# Patient Record
Sex: Female | Born: 1960 | Race: White | Hispanic: No | Smoking: Never smoker
Health system: Southern US, Community
[De-identification: ages and names within clinical notes are randomized; demographics above are authoritative.]

## PROBLEM LIST (undated history)

## (undated) ENCOUNTER — Ambulatory Visit (HOSPITAL_BASED_OUTPATIENT_CLINIC_OR_DEPARTMENT_OTHER): Admission: EM

## (undated) DIAGNOSIS — E119 Type 2 diabetes mellitus without complications: Secondary | ICD-10-CM

## (undated) DIAGNOSIS — T7840XA Allergy, unspecified, initial encounter: Secondary | ICD-10-CM

## (undated) DIAGNOSIS — Z8489 Family history of other specified conditions: Secondary | ICD-10-CM

## (undated) DIAGNOSIS — E039 Hypothyroidism, unspecified: Secondary | ICD-10-CM

## (undated) DIAGNOSIS — J45909 Unspecified asthma, uncomplicated: Secondary | ICD-10-CM

## (undated) DIAGNOSIS — R0600 Dyspnea, unspecified: Secondary | ICD-10-CM

## (undated) DIAGNOSIS — M1990 Unspecified osteoarthritis, unspecified site: Secondary | ICD-10-CM

## (undated) DIAGNOSIS — I10 Essential (primary) hypertension: Secondary | ICD-10-CM

## (undated) DIAGNOSIS — K219 Gastro-esophageal reflux disease without esophagitis: Secondary | ICD-10-CM

## (undated) DIAGNOSIS — E079 Disorder of thyroid, unspecified: Secondary | ICD-10-CM

## (undated) DIAGNOSIS — G709 Myoneural disorder, unspecified: Secondary | ICD-10-CM

## (undated) HISTORY — DX: Unspecified osteoarthritis, unspecified site: M19.90

## (undated) HISTORY — DX: Disorder of thyroid, unspecified: E07.9

## (undated) HISTORY — PX: HYSTERECTOMY ABDOMINAL WITH SALPINGO-OOPHORECTOMY: SHX6792

## (undated) HISTORY — DX: Myoneural disorder, unspecified: G70.9

## (undated) HISTORY — DX: Unspecified asthma, uncomplicated: J45.909

## (undated) HISTORY — DX: Type 2 diabetes mellitus without complications: E11.9

## (undated) HISTORY — PX: CHOLECYSTECTOMY: SHX55

## (undated) HISTORY — PX: POLYPECTOMY: SHX149

## (undated) HISTORY — DX: Essential (primary) hypertension: I10

## (undated) HISTORY — PX: TUBAL LIGATION: SHX77

## (undated) HISTORY — PX: PORTA CATH INSERTION: CATH118285

## (undated) HISTORY — PX: ABDOMINAL HYSTERECTOMY: SHX81

## (undated) HISTORY — DX: Allergy, unspecified, initial encounter: T78.40XA

## (undated) MED FILL — Dextrose Inj 5%: INTRAVENOUS | Qty: 250 | Status: AC

## (undated) MED FILL — Dexamethasone Sodium Phosphate Inj 100 MG/10ML: INTRAMUSCULAR | Qty: 1 | Status: AC

## (undated) MED FILL — Leucovorin Calcium For Inj 350 MG: INTRAMUSCULAR | Qty: 36.6 | Status: AC

## (undated) MED FILL — Fluorouracil IV Soln 5 GM/100ML (50 MG/ML): INTRAVENOUS | Qty: 88 | Status: AC

## (undated) MED FILL — Oxaliplatin IV Soln 100 MG/20ML: INTRAVENOUS | Qty: 30 | Status: AC

## (undated) MED FILL — Fluorouracil IV Soln 2.5 GM/50ML (50 MG/ML): INTRAVENOUS | Qty: 15 | Status: AC

---

## 1999-12-09 ENCOUNTER — Emergency Department (HOSPITAL_COMMUNITY): Admission: EM | Admit: 1999-12-09 | Discharge: 1999-12-09 | Payer: Self-pay | Admitting: Emergency Medicine

## 1999-12-09 ENCOUNTER — Encounter: Payer: Self-pay | Admitting: Emergency Medicine

## 1999-12-16 ENCOUNTER — Emergency Department (HOSPITAL_COMMUNITY): Admission: EM | Admit: 1999-12-16 | Discharge: 1999-12-16 | Payer: Self-pay | Admitting: Emergency Medicine

## 2002-12-05 ENCOUNTER — Emergency Department (HOSPITAL_COMMUNITY): Admission: EM | Admit: 2002-12-05 | Discharge: 2002-12-05 | Payer: Self-pay | Admitting: Emergency Medicine

## 2004-04-10 ENCOUNTER — Ambulatory Visit: Payer: Self-pay | Admitting: Family Medicine

## 2004-10-09 ENCOUNTER — Ambulatory Visit: Payer: Self-pay | Admitting: Family Medicine

## 2004-10-10 ENCOUNTER — Ambulatory Visit: Payer: Self-pay | Admitting: *Deleted

## 2004-10-14 ENCOUNTER — Encounter: Admission: RE | Admit: 2004-10-14 | Discharge: 2004-10-14 | Payer: Self-pay | Admitting: Family Medicine

## 2005-03-20 ENCOUNTER — Encounter (INDEPENDENT_AMBULATORY_CARE_PROVIDER_SITE_OTHER): Payer: Self-pay | Admitting: Family Medicine

## 2005-03-20 ENCOUNTER — Ambulatory Visit: Payer: Self-pay | Admitting: Family Medicine

## 2005-03-20 LAB — CONVERTED CEMR LAB: Pap Smear: ABNORMAL

## 2005-07-31 ENCOUNTER — Ambulatory Visit: Payer: Self-pay | Admitting: Family Medicine

## 2005-08-12 ENCOUNTER — Ambulatory Visit: Payer: Self-pay | Admitting: Family Medicine

## 2005-08-19 ENCOUNTER — Ambulatory Visit: Payer: Self-pay | Admitting: Internal Medicine

## 2005-08-28 ENCOUNTER — Ambulatory Visit: Payer: Self-pay | Admitting: Internal Medicine

## 2005-09-29 ENCOUNTER — Ambulatory Visit: Payer: Self-pay | Admitting: Family Medicine

## 2005-10-23 ENCOUNTER — Ambulatory Visit: Payer: Self-pay | Admitting: Family Medicine

## 2005-10-23 ENCOUNTER — Encounter: Payer: Self-pay | Admitting: Family Medicine

## 2005-10-23 ENCOUNTER — Encounter (INDEPENDENT_AMBULATORY_CARE_PROVIDER_SITE_OTHER): Payer: Self-pay | Admitting: Family Medicine

## 2005-10-23 LAB — CONVERTED CEMR LAB: Pap Smear: NORMAL

## 2005-11-03 ENCOUNTER — Encounter: Admission: RE | Admit: 2005-11-03 | Discharge: 2005-11-03 | Payer: Self-pay | Admitting: Family Medicine

## 2005-11-03 DIAGNOSIS — N63 Unspecified lump in unspecified breast: Secondary | ICD-10-CM | POA: Insufficient documentation

## 2005-11-13 ENCOUNTER — Encounter: Admission: RE | Admit: 2005-11-13 | Discharge: 2005-11-13 | Payer: Self-pay | Admitting: Family Medicine

## 2005-11-19 ENCOUNTER — Ambulatory Visit: Payer: Self-pay | Admitting: Family Medicine

## 2005-12-17 ENCOUNTER — Ambulatory Visit: Payer: Self-pay | Admitting: Family Medicine

## 2005-12-19 ENCOUNTER — Ambulatory Visit (HOSPITAL_COMMUNITY): Admission: RE | Admit: 2005-12-19 | Discharge: 2005-12-19 | Payer: Self-pay | Admitting: Obstetrics and Gynecology

## 2006-01-28 ENCOUNTER — Ambulatory Visit: Payer: Self-pay | Admitting: Obstetrics & Gynecology

## 2006-04-08 ENCOUNTER — Ambulatory Visit: Payer: Self-pay | Admitting: Family Medicine

## 2006-04-20 ENCOUNTER — Ambulatory Visit: Payer: Self-pay | Admitting: Family Medicine

## 2006-04-22 ENCOUNTER — Ambulatory Visit: Payer: Self-pay | Admitting: Family Medicine

## 2006-04-23 ENCOUNTER — Emergency Department (HOSPITAL_COMMUNITY): Admission: EM | Admit: 2006-04-23 | Discharge: 2006-04-23 | Payer: Self-pay | Admitting: Emergency Medicine

## 2006-06-29 ENCOUNTER — Encounter: Admission: RE | Admit: 2006-06-29 | Discharge: 2006-06-29 | Payer: Self-pay | Admitting: Family Medicine

## 2006-10-06 ENCOUNTER — Ambulatory Visit: Payer: Self-pay | Admitting: Family Medicine

## 2006-10-06 LAB — CONVERTED CEMR LAB: Cholesterol: 166 mg/dL

## 2006-10-18 ENCOUNTER — Emergency Department (HOSPITAL_COMMUNITY): Admission: EM | Admit: 2006-10-18 | Discharge: 2006-10-18 | Payer: Self-pay | Admitting: Emergency Medicine

## 2006-12-29 ENCOUNTER — Encounter: Payer: Self-pay | Admitting: Family Medicine

## 2006-12-29 DIAGNOSIS — J45909 Unspecified asthma, uncomplicated: Secondary | ICD-10-CM | POA: Insufficient documentation

## 2006-12-29 DIAGNOSIS — N809 Endometriosis, unspecified: Secondary | ICD-10-CM | POA: Insufficient documentation

## 2006-12-29 DIAGNOSIS — J40 Bronchitis, not specified as acute or chronic: Secondary | ICD-10-CM | POA: Insufficient documentation

## 2006-12-29 DIAGNOSIS — E669 Obesity, unspecified: Secondary | ICD-10-CM | POA: Insufficient documentation

## 2006-12-29 DIAGNOSIS — F3289 Other specified depressive episodes: Secondary | ICD-10-CM | POA: Insufficient documentation

## 2006-12-29 DIAGNOSIS — I10 Essential (primary) hypertension: Secondary | ICD-10-CM | POA: Insufficient documentation

## 2006-12-29 DIAGNOSIS — E039 Hypothyroidism, unspecified: Secondary | ICD-10-CM | POA: Insufficient documentation

## 2006-12-29 DIAGNOSIS — J301 Allergic rhinitis due to pollen: Secondary | ICD-10-CM | POA: Insufficient documentation

## 2006-12-29 DIAGNOSIS — F329 Major depressive disorder, single episode, unspecified: Secondary | ICD-10-CM

## 2007-01-27 ENCOUNTER — Encounter (INDEPENDENT_AMBULATORY_CARE_PROVIDER_SITE_OTHER): Payer: Self-pay | Admitting: *Deleted

## 2007-02-04 ENCOUNTER — Telehealth (INDEPENDENT_AMBULATORY_CARE_PROVIDER_SITE_OTHER): Payer: Self-pay | Admitting: *Deleted

## 2007-03-03 ENCOUNTER — Emergency Department (HOSPITAL_COMMUNITY): Admission: EM | Admit: 2007-03-03 | Discharge: 2007-03-03 | Payer: Self-pay | Admitting: Gynecologic Oncology

## 2007-03-03 ENCOUNTER — Encounter (INDEPENDENT_AMBULATORY_CARE_PROVIDER_SITE_OTHER): Payer: Self-pay | Admitting: Family Medicine

## 2007-03-03 DIAGNOSIS — N10 Acute tubulo-interstitial nephritis: Secondary | ICD-10-CM | POA: Insufficient documentation

## 2007-03-09 ENCOUNTER — Encounter (INDEPENDENT_AMBULATORY_CARE_PROVIDER_SITE_OTHER): Payer: Self-pay | Admitting: Family Medicine

## 2007-03-09 ENCOUNTER — Ambulatory Visit: Payer: Self-pay | Admitting: Family Medicine

## 2007-03-09 LAB — CONVERTED CEMR LAB
Blood in Urine, dipstick: NEGATIVE
Hgb A1c MFr Bld: 5.8 %
Nitrite: NEGATIVE

## 2007-03-15 ENCOUNTER — Encounter: Admission: RE | Admit: 2007-03-15 | Discharge: 2007-03-15 | Payer: Self-pay | Admitting: Family Medicine

## 2007-03-24 ENCOUNTER — Encounter (INDEPENDENT_AMBULATORY_CARE_PROVIDER_SITE_OTHER): Payer: Self-pay | Admitting: Family Medicine

## 2007-03-24 LAB — CONVERTED CEMR LAB
Blood in Urine, dipstick: NEGATIVE
Glucose, Urine, Semiquant: NEGATIVE
Ketones, urine, test strip: NEGATIVE
Nitrite: NEGATIVE
Protein, U semiquant: NEGATIVE
Specific Gravity, Urine: 1.025
WBC Urine, dipstick: NEGATIVE
pH: 6

## 2007-05-11 ENCOUNTER — Telehealth (INDEPENDENT_AMBULATORY_CARE_PROVIDER_SITE_OTHER): Payer: Self-pay | Admitting: Family Medicine

## 2007-07-05 ENCOUNTER — Ambulatory Visit: Payer: Self-pay | Admitting: Family Medicine

## 2007-07-05 DIAGNOSIS — R3589 Other polyuria: Secondary | ICD-10-CM | POA: Insufficient documentation

## 2007-07-05 DIAGNOSIS — B3731 Acute candidiasis of vulva and vagina: Secondary | ICD-10-CM | POA: Insufficient documentation

## 2007-07-05 DIAGNOSIS — R358 Other polyuria: Secondary | ICD-10-CM

## 2007-07-05 DIAGNOSIS — B373 Candidiasis of vulva and vagina: Secondary | ICD-10-CM

## 2007-07-05 DIAGNOSIS — J069 Acute upper respiratory infection, unspecified: Secondary | ICD-10-CM | POA: Insufficient documentation

## 2007-07-05 LAB — CONVERTED CEMR LAB
Protein, U semiquant: NEGATIVE
Specific Gravity, Urine: <1.005
pH: 5.5

## 2007-07-06 ENCOUNTER — Encounter (INDEPENDENT_AMBULATORY_CARE_PROVIDER_SITE_OTHER): Payer: Self-pay | Admitting: Family Medicine

## 2007-07-13 ENCOUNTER — Encounter (INDEPENDENT_AMBULATORY_CARE_PROVIDER_SITE_OTHER): Payer: Self-pay | Admitting: *Deleted

## 2007-10-12 ENCOUNTER — Ambulatory Visit: Payer: Self-pay | Admitting: Family Medicine

## 2007-10-12 DIAGNOSIS — A088 Other specified intestinal infections: Secondary | ICD-10-CM | POA: Insufficient documentation

## 2007-12-10 ENCOUNTER — Telehealth (INDEPENDENT_AMBULATORY_CARE_PROVIDER_SITE_OTHER): Payer: Self-pay | Admitting: *Deleted

## 2008-01-26 ENCOUNTER — Telehealth (INDEPENDENT_AMBULATORY_CARE_PROVIDER_SITE_OTHER): Payer: Self-pay | Admitting: *Deleted

## 2008-01-28 ENCOUNTER — Ambulatory Visit: Payer: Self-pay | Admitting: Family Medicine

## 2008-01-28 ENCOUNTER — Encounter (INDEPENDENT_AMBULATORY_CARE_PROVIDER_SITE_OTHER): Payer: Self-pay | Admitting: Internal Medicine

## 2008-01-28 DIAGNOSIS — N949 Unspecified condition associated with female genital organs and menstrual cycle: Secondary | ICD-10-CM | POA: Insufficient documentation

## 2008-01-28 DIAGNOSIS — N938 Other specified abnormal uterine and vaginal bleeding: Secondary | ICD-10-CM | POA: Insufficient documentation

## 2008-01-28 DIAGNOSIS — N390 Urinary tract infection, site not specified: Secondary | ICD-10-CM | POA: Insufficient documentation

## 2008-01-28 LAB — CONVERTED CEMR LAB
KOH Prep: NEGATIVE
Nitrite: POSITIVE
Urobilinogen, UA: 1

## 2008-01-29 ENCOUNTER — Encounter (INDEPENDENT_AMBULATORY_CARE_PROVIDER_SITE_OTHER): Payer: Self-pay | Admitting: Internal Medicine

## 2008-02-25 ENCOUNTER — Encounter (INDEPENDENT_AMBULATORY_CARE_PROVIDER_SITE_OTHER): Payer: Self-pay | Admitting: Internal Medicine

## 2010-02-26 ENCOUNTER — Encounter (INDEPENDENT_AMBULATORY_CARE_PROVIDER_SITE_OTHER): Payer: Self-pay | Admitting: Internal Medicine

## 2010-06-11 NOTE — Letter (Signed)
Summary: FAXED REQUESTED RECORDS TO [facility]  FAXED REQUESTED RECORDS TO [facility]   Imported By: Arta Bruce 02/26/2010 09:56:27  _____________________________________________________________________  External Attachment:    Type:   Image     Comment:   External Document

## 2010-09-27 NOTE — Group Therapy Note (Signed)
NAMESHANTINIQUE, PICAZO NO.:  [id number]   MEDICAL RECORD NO.:  [id number]          PATIENT TYPE:  WOC   LOCATION:  [facility]                   FACILITY:  [facility]   PHYSICIAN:  Elsie Lincoln, MD      DATE OF BIRTH:  October 12, 1960   DATE OF SERVICE:                                    CLINIC NOTE   The patient is a 50 year old female who presents to follow up on  endometriosis.  The patient was placed on Ortho Tri-Cyclen which has been  changed to Alesse as this is available at [facility].  She also has to  take the Anaprox DS b.i.d. or p.r.n. for pain.  This has improved her pain  to a bearable level.  The fact is it is still painful and we did talk about  this some.  Her husband does not seem to be very understanding about sex  being painful for her and only has sex on all fours as this is easier on  him, however this is very painful for her as this allows for deep  penetration.  We talked about communication and how this will improve her  sexual life and her relationship with her husband.  The patient is to come  back in six months to follow up on endometriosis.  Her Pap smear is up to  date.  She reports her mammogram is up to date.  She gets these done at  [facility].  The patient is to come back again in six months.           ______________________________  Elsie Lincoln, MD     KL/MEDQ  D:  01/28/2006  T:  01/29/2006  Job:  [id number]

## 2010-09-27 NOTE — Consult Note (Signed)
NAMECORRIE, REDER NO.:  [id number]   MEDICAL RECORD NO.:  [id number]          PATIENT TYPE:  WOC   LOCATION:  [facility]                          FACILITY:  [facility]   PHYSICIAN:  Madelynn Done, MD  DATE OF BIRTH:  Jun 03, 1960   DATE OF CONSULTATION:  DATE OF DISCHARGE:  01/28/2006                                 CONSULTATION   REQUESTING PHYSICIAN:  Nelva Nay, MD emergency department.   REASON FOR CONSULTATION:  Right index finger abscess.   BRIEF HISTORY:  Tanya Mcclure is a 50 year old right-hand-dominant female  who approximately six days ago noted pain and swelling in her right  index finger.  She was seen by a physician twice who treated her with  oral antibiotics.  She was seen yesterday and had an attempted drainage  of the area of concern over the index finger, but no fluid was removed.  She presented today to the emergency department with continued pain and  increased swelling of her right index finger.  She denies any fevers,  chills, or any constitutional signs of infection.  She does not recall  any specific injury to the finger.  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   PAST MEDICAL HISTORY:  Hypertension.  She denies high blood sugars,  heart disease, lung disease, or kidney disease.   PAST SURGICAL HISTORY:  None.   ALLERGIES:  PENICILLIN.   MEDICATIONS:  None currently.   SOCIAL HISTORY:  She is a nonsmoker.  She is married.  She has a 51 year  old and a 51 year old who lives at home, and they live in [place].  She also has another child with two grand kids that live in [place].  Again, she works at [employer].   REVIEW OF SYSTEMS:  As mentioned above.  No recent illnesses,  hospitalizations.   PHYSICAL EXAMINATION:  GENERAL:  She is a healthy-appearing white female  in no acute distress.  VITAL SIGNS:  She is afebrile.  Her  vital signs are stable and normal.  RIGHT UPPER EXTREMITY:  On examination of her right upper extremity, she  has no areas of lymphangitis or proximal redness.  She does have pain  and swelling of her right index finger.  There are no other focal areas  of swelling.  There is an isolated area of swelling and redness over the  proximal phalanx of the index finger on the volar surface.  She does not  have tenderness to palpation along the entire length of the flexor  tendon sheath.  She is not swollen distal to the proximal phalangeal  region.  There is fluctuant area over the volar radial side of the  proximal phalanx with some skin changes present.  There is no draining  wound.  She is able to flex the DIP and the PIP joint of the finger.  She has some pain with slight passive motion of the finger.  It is not a  sausage-appearing digit.  There does not appear to be evidence of a  flexor tendon sheath infection, tenosynovitis.  The finger is well  perfused.  Sensation to light touch is present to the tip of her finger.  On the remainder of the hands, she has no other evidence of any open  wounds.  She does have a small cutaneous wart on the dorsum of the right  middle finger.  The remainder of the hand is well perfused with good  capillary refill.  She has normal muscle tone to the hand.   LABORATORY STUDIES:  She has a normal white count.   RADIOGRAPHS:  AP, lateral, and oblique films of the right index finger  do not show any osseous involvement.  There is soft tissue swelling  noted over the P1 region of the finger.   IMPRESSION:  Right index finger subcutaneous abscess.   PLAN:  I talked to the patient about incision and drainage in the  emergency department.  I felt it was necessary that the patient undergo  the procedure to drain the focal collection of purulence.  The patient  agreed to proceed with this.  A digital block was performed of 10 mL of  1% lidocaine without  epinephrine.  She tolerated this procedure well.   PROCEDURE NOTE:  The right index finger and the hand was then prepped  with Betadine.  It was then sterilely draped.  Under sterile conditions,  using a 15-blade scalpel, where the previous attempts at incising the  abscess had been made, this was extended both proximally and distally  longitudinally.  Dissection was carried down through the skin and  through the subcutaneous tissue, and gross purulence was then  encountered.  Cultures were then taken.  Using a blunt hemostat, blunt  dissection was then done to the ulnar side of the finger in order to  open up the entire purulent cavity.  The purulent cavity was then  expressed and thoroughly irrigated with saline solution.  The abscess  was then packed with sterile packing gauze.  The patient tolerated the  procedure well.  A sterile dressing was then applied, and the finger was  then wrapped in Coban.   RECOMMENDATIONS:  The patient is going to follow up in my office  tomorrow in the afternoon to look at the wound and to likely repack the  wound.  She is going to continue with her antibiotics.  She had been on  what sounds like Bactrim, and oral pain medication will be given.  The  patient was instructed to return to the clinic or the emergency  department sooner if she has worsening pain or any systemic signs of  infection.  The patient's questions were answered today.      Madelynn Done, MD  Electronically Signed     FWO/MEDQ  D:  04/23/2006  T:  04/24/2006  Job:  [id number]
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
## 2011-02-19 LAB — BASIC METABOLIC PANEL
BUN: 13
CO2: 26
Calcium: 8.7
Creatinine, Ser: 1.03
GFR calc Af Amer: >60
GFR calc non Af Amer: 57 — ABNORMAL LOW
Glucose, Bld: 135 — ABNORMAL HIGH

## 2011-02-19 LAB — URINALYSIS, ROUTINE W REFLEX MICROSCOPIC
Glucose, UA: NEGATIVE
Protein, ur: 100 — AB

## 2011-02-19 LAB — DIFFERENTIAL
Basophils Relative: 1
Eosinophils Absolute: 0
Eosinophils Relative: 0
Lymphocytes Relative: 9 — ABNORMAL LOW
Neutro Abs: 6
Neutrophils Relative %: 77

## 2011-02-19 LAB — CBC
MCV: 84.3
Platelets: 204
RDW: 12.4
WBC: 7.7

## 2011-02-19 LAB — URINE MICROSCOPIC-ADD ON

## 2015-12-19 ENCOUNTER — Encounter: Payer: Self-pay | Admitting: Sports Medicine

## 2015-12-19 ENCOUNTER — Encounter (INDEPENDENT_AMBULATORY_CARE_PROVIDER_SITE_OTHER): Payer: Self-pay

## 2015-12-19 ENCOUNTER — Ambulatory Visit (INDEPENDENT_AMBULATORY_CARE_PROVIDER_SITE_OTHER): Payer: Medicaid Other | Admitting: Sports Medicine

## 2015-12-19 ENCOUNTER — Ambulatory Visit (INDEPENDENT_AMBULATORY_CARE_PROVIDER_SITE_OTHER): Payer: Medicaid Other

## 2015-12-19 DIAGNOSIS — M79673 Pain in unspecified foot: Secondary | ICD-10-CM

## 2015-12-19 DIAGNOSIS — M722 Plantar fascial fibromatosis: Secondary | ICD-10-CM

## 2015-12-19 DIAGNOSIS — E119 Type 2 diabetes mellitus without complications: Secondary | ICD-10-CM

## 2015-12-19 MED ORDER — TRIAMCINOLONE ACETONIDE 10 MG/ML IJ SUSP: 10.0000 mg | Freq: Once | INTRAMUSCULAR | Status: AC

## 2015-12-19 MED ORDER — MELOXICAM 15 MG PO TABS: 15.0000 mg | ORAL_TABLET | Freq: Every day | ORAL | 0 refills | Status: DC

## 2015-12-19 MED ORDER — METHYLPREDNISOLONE 4 MG PO TBPK: ORAL_TABLET | ORAL | 0 refills | Status: DC

## 2015-12-19 NOTE — Patient Instructions (Signed)

## 2015-12-19 NOTE — Progress Notes (Signed)
Subjective: Tanya Mcclure is a 55 y.o. diabetic female patient presents to office with complaint of heel pain on the Right>left. Patient admits to post static dyskinesia for 6 months on right, and a few weeks on left. Patient has treated this problem with going to primary care doctor and was given topical lidocaine pain cream with no relief. Denies any other pedal complaints.   Fasting blood sugar 99 this morning  Patient Active Problem List   Diagnosis Date Noted  . UTI 01/28/2008  . MENORRHAGIA, PERIMENOPAUSAL 01/28/2008  . GASTROENTERITIS, VIRAL 10/12/2007  . VAGINITIS, CANDIDAL 07/05/2007  . URI 07/05/2007  . POLYURIA 07/05/2007  . PYELONEPHRITIS, ACUTE 03/03/2007  . HYPOTHYROIDISM 12/29/2006  . OBESITY NOS 12/29/2006  . DEPRESSION 12/29/2006  . HYPERTENSION 12/29/2006  . ALLERGIC RHINITIS, SEASONAL 12/29/2006  . BRONCHITIS 12/29/2006  . ASTHMA 12/29/2006  . ENDOMETRIOSIS 12/29/2006  . BREAST MASS, LEFT 11/03/2005    No current outpatient prescriptions on file prior to visit.   No current facility-administered medications on file prior to visit.     Allergies  Allergen Reactions  . Penicillins     Objective: Physical Exam General: The patient is alert and oriented x3 in no acute distress.  Dermatology: Skin is warm, dry and supple bilateral lower extremities. Nails 1-10 are normal. There is no erythema, edema, no eccymosis, no open lesions present. Integument is otherwise unremarkable.  Vascular: Dorsalis Pedis pulse and Posterior Tibial pulse are 2/4 bilateral. Capillary fill time is immediate to all digits.  Neurological: Grossly intact to light touch with an achilles reflex of +2/5 and a negative Tinel's sign bilateral.Vibratory and protective with Semmes Weinstein monofilament intact bilateral.  Musculoskeletal: Tenderness to palpation at the medial calcaneal tubercale and through the insertion of the plantar fascia on the right greater than left foot. No pain  with compression of calcaneus bilateral. No pain with tuning fork to calcaneus bilateral. No pain with calf compression bilateral. There is decreased Ankle joint range of motion bilateral. All other joints range of motion within normal limits bilateral. Strength 5/5 in all groups bilateral. Pes planus foot type with mild lesser hammertoes.  Xray, Right and Left foot:  Normal osseous mineralization. Joint spaces preserved, except at first metatarsal phalangeal joint where there is mild narrowing bilateral. Planus foot type. Mild lesser hammertoe, mild enlargement of the third metatarsal head left, No fracture/dislocation/boney destruction. Calcaneal spur present with mild thickening of plantar fascia, right greater than left. No other soft tissue abnormalities or radiopaque foreign bodies.   Assessment and Plan: Problem List Items Addressed This Visit    None    Visit Diagnoses    Foot pain, unspecified laterality    -  Primary   Relevant Medications   methylPREDNISolone (MEDROL DOSEPAK) 4 MG TBPK tablet   meloxicam (MOBIC) 15 MG tablet   triamcinolone acetonide (KENALOG) 10 MG/ML injection 10 mg   Other Relevant Orders   DG Foot 2 Views Left   DG Foot 2 Views Right   Plantar fasciitis       Relevant Medications   methylPREDNISolone (MEDROL DOSEPAK) 4 MG TBPK tablet   meloxicam (MOBIC) 15 MG tablet   triamcinolone acetonide (KENALOG) 10 MG/ML injection 10 mg   Diabetes mellitus without complication (HCC)          -Complete examination performed.  -Xrays reviewed -Discussed with patient in detail the condition of plantar fasciitis, how this occurs and general treatment options. Explained both conservative and surgical treatments.  -After oral consent and aseptic  prep, injected a mixture containing 1 ml of 2% plain lidocaine, 1 ml 0.5% plain marcaine, 0.5 ml of kenalog 10 and 0.5 ml of dexamethasone phosphate into Right and left heels. Post-injection care discussed with patient.  -Rx  Meloxicam to start after Medrol dose pack is completed -Recommended good supportive shoes and advised use of OTC insert. Explained to patient that if these orthoses work well, we will continue with these. If these do not improve her condition and  pain, we will consider custom molded orthoses with diabetic shoes with prescription from [place] and dispensed to patient daily stretching exercises. -Recommend patient to ice affected area 1-2x daily. -Recommend daily foot inspection in setting of diabetes. -Patient to return to office in 4 weeks for follow up or sooner if problems or questions arise.  Landis Martins, DPM

## 2016-01-16 ENCOUNTER — Ambulatory Visit (INDEPENDENT_AMBULATORY_CARE_PROVIDER_SITE_OTHER): Payer: Medicaid Other | Admitting: Sports Medicine

## 2016-01-16 ENCOUNTER — Encounter: Payer: Self-pay | Admitting: Sports Medicine

## 2016-01-16 DIAGNOSIS — E119 Type 2 diabetes mellitus without complications: Secondary | ICD-10-CM | POA: Diagnosis not present

## 2016-01-16 DIAGNOSIS — M722 Plantar fascial fibromatosis: Secondary | ICD-10-CM | POA: Diagnosis not present

## 2016-01-16 DIAGNOSIS — M79673 Pain in unspecified foot: Secondary | ICD-10-CM

## 2016-01-16 MED ORDER — TRIAMCINOLONE ACETONIDE 40 MG/ML IJ SUSP: 20.0000 mg | Freq: Once | INTRAMUSCULAR | Status: AC

## 2016-01-16 NOTE — Progress Notes (Signed)
Subjective: Tanya Mcclure is a 55 y.o. female returns to office for follow up evaluation after Left and Right heel injection for plantar fasciitis, injection #1 administered 3 weeks ago. Patient states that the injection seems to help her pain; pain is now 5/10 on right and 0/10 on left and has  decreased in frequency to the area. Patient tolerated dose pack and mobic well; denies any recent changes in medications or new problems since last visit.   FBS 115 this AM   Patient Active Problem List   Diagnosis Date Noted  . UTI 01/28/2008  . MENORRHAGIA, PERIMENOPAUSAL 01/28/2008  . GASTROENTERITIS, VIRAL 10/12/2007  . VAGINITIS, CANDIDAL 07/05/2007  . URI 07/05/2007  . POLYURIA 07/05/2007  . PYELONEPHRITIS, ACUTE 03/03/2007  . HYPOTHYROIDISM 12/29/2006  . OBESITY NOS 12/29/2006  . DEPRESSION 12/29/2006  . HYPERTENSION 12/29/2006  . ALLERGIC RHINITIS, SEASONAL 12/29/2006  . BRONCHITIS 12/29/2006  . ASTHMA 12/29/2006  . ENDOMETRIOSIS 12/29/2006  . BREAST MASS, LEFT 11/03/2005    Current Outpatient Prescriptions on File Prior to Visit  Medication Sig Dispense Refill  . meloxicam (MOBIC) 15 MG tablet Take 1 tablet (15 mg total) by mouth daily. 30 tablet 0  . methylPREDNISolone (MEDROL DOSEPAK) 4 MG TBPK tablet Take 1st as instructed 21 tablet 0   Current Facility-Administered Medications on File Prior to Visit  Medication Dose Route Frequency Provider Last Rate Last Dose  . triamcinolone acetonide (KENALOG) 10 MG/ML injection 10 mg  10 mg Other Once Landis Martins, DPM        Allergies  Allergen Reactions  . Penicillins     Objective:   General:  Alert and oriented x 3, in no acute distress  Dermatology: Skin is warm, dry, and supple bilateral. Nails are within normal limits. There is no lower extremity erythema, no eccymosis, no open lesions present bilateral.   Vascular: Dorsalis Pedis and Posterior Tibial pedal pulses are 2/4 bilateral. + hair growth noted bilateral.  Capillary Fill Time is 3 seconds in all digits. No varicosities, No edema bilateral lower extremities.   Neurological: Sensation grossly intact to light touch with an achilles reflex of +2 and a negative Tinel's sign bilateral. Vibratory, sharp/dull, Semmes Weinstein Monofilament within normal limits.   Musculoskeletal: There is decreased tenderness to palpation at the medial calcaneal tubercale and through the insertion of the plantar fascia on the right. No pain on left. No pain with compression to calcaneus or application of tuning fork. There is decreased Ankle joint range of motion bilateral. All other jointsrange of motion  within normal limits bilateral. Pes planus foot type with mild lesser hammertoes. Strength 5/5 bilateral.   Assessment and Plan: Problem List Items Addressed This Visit    None    Visit Diagnoses    Plantar fasciitis    -  Primary   Relevant Medications   triamcinolone acetonide (KENALOG-40) injection 20 mg   Foot pain, unspecified laterality       Diabetes mellitus without complication (HCC)         -Complete examination performed.  -Previous x-rays reviewed. -Discussed with patient in detail the condition of plantar fasciitis, how this occurs related to the foot type of the patient and general treatment options. - Patient opted for another injection today on Right; After oral consent and aseptic prep, injected a mixture containing 0.5 ml of 1%plain lidocaine, 0.5 ml 0.5% plain marcaine, 0.5 ml of kenalog 40 and 0.5 ml of dexmethasone phosphate to right heel at area of most pain via plantar  approach/trigger point injection. Post injection care discussed -Applied fascial taping on right foot to keep in place for 5 days as instructed -Continue with Mobic until completed, call if refill is needed -Continue with stretching, icing, good supportive shoes, inserts daily. May consider custom inserts from [facility] in future if patient did well with taping.  -Discussed long  term care and reocurrence; will closely monitor; if fails to improve will consider other treatment modalities.  -Patient to return to office in 1 month for follow up or sooner if problems or questions arise.  Landis Martins, DPM

## 2016-01-16 NOTE — Patient Instructions (Signed)

## 2016-02-20 ENCOUNTER — Encounter: Payer: Self-pay | Admitting: Sports Medicine

## 2016-02-20 ENCOUNTER — Ambulatory Visit (INDEPENDENT_AMBULATORY_CARE_PROVIDER_SITE_OTHER): Payer: Medicaid Other | Admitting: Sports Medicine

## 2016-02-20 DIAGNOSIS — E119 Type 2 diabetes mellitus without complications: Secondary | ICD-10-CM

## 2016-02-20 DIAGNOSIS — M216X2 Other acquired deformities of left foot: Secondary | ICD-10-CM | POA: Diagnosis not present

## 2016-02-20 DIAGNOSIS — M722 Plantar fascial fibromatosis: Secondary | ICD-10-CM

## 2016-02-20 DIAGNOSIS — M216X1 Other acquired deformities of right foot: Secondary | ICD-10-CM | POA: Diagnosis not present

## 2016-02-20 NOTE — Progress Notes (Signed)
Subjective: Tanya Mcclure is a 55 y.o. female returns to office for follow up evaluation after Right heel injection for plantar fasciitis, injection #2 administered 4 weeks ago. Patient states that the injection seems to help her pain; pain is now much better and bearable 2/10 on right and 0/10 on left and has decreased in frequency to the area. Patient states taping felt good; denies any recent changes in medications or new problems since last visit.   FBS 111 this AM   Patient Active Problem List   Diagnosis Date Noted  . UTI 01/28/2008  . MENORRHAGIA, PERIMENOPAUSAL 01/28/2008  . GASTROENTERITIS, VIRAL 10/12/2007  . VAGINITIS, CANDIDAL 07/05/2007  . URI 07/05/2007  . POLYURIA 07/05/2007  . PYELONEPHRITIS, ACUTE 03/03/2007  . HYPOTHYROIDISM 12/29/2006  . OBESITY NOS 12/29/2006  . DEPRESSION 12/29/2006  . HYPERTENSION 12/29/2006  . ALLERGIC RHINITIS, SEASONAL 12/29/2006  . BRONCHITIS 12/29/2006  . ASTHMA 12/29/2006  . ENDOMETRIOSIS 12/29/2006  . BREAST MASS, LEFT 11/03/2005    Current Outpatient Prescriptions on File Prior to Visit  Medication Sig Dispense Refill  . meloxicam (MOBIC) 15 MG tablet Take 1 tablet (15 mg total) by mouth daily. 30 tablet 0  . methylPREDNISolone (MEDROL DOSEPAK) 4 MG TBPK tablet Take 1st as instructed 21 tablet 0   Current Facility-Administered Medications on File Prior to Visit  Medication Dose Route Frequency Provider Last Rate Last Dose  . triamcinolone acetonide (KENALOG) 10 MG/ML injection 10 mg  10 mg Other Once [employer], DPM      . triamcinolone acetonide (KENALOG-40) injection 20 mg  20 mg Other Once Landis Martins, DPM        Allergies  Allergen Reactions  . Penicillins     Objective:   General:  Alert and oriented x 3, in no acute distress  Dermatology: Skin is warm, dry, and supple bilateral. Nails are within normal limits. There is no lower extremity erythema, no eccymosis, no open lesions present bilateral.   Vascular:  Dorsalis Pedis and Posterior Tibial pedal pulses are 2/4 bilateral. + hair growth noted bilateral. Capillary Fill Time is 3 seconds in all digits. No varicosities, No edema bilateral lower extremities.   Neurological: Sensation grossly intact to light touch with an achilles reflex of +2 and a negative Tinel's sign bilateral. Vibratory, sharp/dull, Semmes Weinstein Monofilament within normal limits.   Musculoskeletal: There is decreased tenderness to palpation at the medial calcaneal tubercale and through the insertion of the plantar fascia on the right. No pain on left. No pain with compression to calcaneus or application of tuning fork. There is decreased Ankle joint range of motion bilateral. All other jointsrange of motion  within normal limits bilateral. Pes planus foot type with mild lesser hammertoes. Strength 5/5 bilateral.   Assessment and Plan: Problem List Items Addressed This Visit    None    Visit Diagnoses    Plantar fasciitis    -  Primary   R>L   Acquired equinus deformity of both feet       Diabetes mellitus without complication (HCC)         -Complete examination performed.  -Previous x-rays reviewed. -Discussed with patient in detail the condition of plantar fasciitis, how this occurs related to the foot type of the patient and general treatment options. - Rx Custom orthotics from [facility] -Continue with stretching, icing, good supportive shoes daily -Discussed long term care and reocurrence; will closely monitor; if fails to improve will consider other treatment modalities.  -Patient to return to  office in 6 weeks for follow up or sooner if problems or questions arise.  Landis Martins, DPM

## 2016-04-02 ENCOUNTER — Ambulatory Visit: Payer: Medicaid Other | Admitting: Sports Medicine

## 2017-06-22 DIAGNOSIS — E559 Vitamin D deficiency, unspecified: Secondary | ICD-10-CM | POA: Insufficient documentation

## 2017-06-30 DIAGNOSIS — M1711 Unilateral primary osteoarthritis, right knee: Secondary | ICD-10-CM | POA: Insufficient documentation

## 2018-04-19 DIAGNOSIS — E1149 Type 2 diabetes mellitus with other diabetic neurological complication: Secondary | ICD-10-CM | POA: Insufficient documentation

## 2018-05-04 DIAGNOSIS — J4521 Mild intermittent asthma with (acute) exacerbation: Secondary | ICD-10-CM | POA: Insufficient documentation

## 2019-05-11 DIAGNOSIS — E782 Mixed hyperlipidemia: Secondary | ICD-10-CM | POA: Insufficient documentation

## 2020-04-11 DIAGNOSIS — C801 Malignant (primary) neoplasm, unspecified: Secondary | ICD-10-CM

## 2020-04-11 HISTORY — DX: Malignant (primary) neoplasm, unspecified: C80.1

## 2020-04-23 DIAGNOSIS — C20 Malignant neoplasm of rectum: Secondary | ICD-10-CM | POA: Insufficient documentation

## 2020-04-26 ENCOUNTER — Telehealth: Payer: Self-pay | Admitting: Oncology

## 2020-04-26 NOTE — Telephone Encounter (Signed)
Patient referred by Dr Orrin Brigham for Rectal CA.  Appt made for 05/10/2020 Labs 10:00 am - Consult 10:30 am.  Patient is scheduled with Dr Orlene Erm 12/17

## 2020-05-10 ENCOUNTER — Other Ambulatory Visit: Payer: Self-pay | Admitting: Oncology

## 2020-05-10 ENCOUNTER — Other Ambulatory Visit: Payer: Self-pay | Admitting: Hematology and Oncology

## 2020-05-10 ENCOUNTER — Inpatient Hospital Stay (INDEPENDENT_AMBULATORY_CARE_PROVIDER_SITE_OTHER): Payer: Medicaid Other | Admitting: Oncology

## 2020-05-10 ENCOUNTER — Inpatient Hospital Stay: Payer: Medicaid Other | Attending: Oncology

## 2020-05-10 ENCOUNTER — Encounter: Payer: Self-pay | Admitting: Oncology

## 2020-05-10 ENCOUNTER — Other Ambulatory Visit: Payer: Self-pay

## 2020-05-10 VITALS — BP 160/78 | HR 71 | Temp 98.6°F | Resp 16 | Ht 63.0 in | Wt 189.5 lb

## 2020-05-10 DIAGNOSIS — E119 Type 2 diabetes mellitus without complications: Secondary | ICD-10-CM | POA: Diagnosis not present

## 2020-05-10 DIAGNOSIS — R197 Diarrhea, unspecified: Secondary | ICD-10-CM | POA: Diagnosis not present

## 2020-05-10 DIAGNOSIS — C20 Malignant neoplasm of rectum: Secondary | ICD-10-CM

## 2020-05-10 DIAGNOSIS — R110 Nausea: Secondary | ICD-10-CM | POA: Diagnosis not present

## 2020-05-10 DIAGNOSIS — Z90710 Acquired absence of both cervix and uterus: Secondary | ICD-10-CM | POA: Diagnosis not present

## 2020-05-10 DIAGNOSIS — Z7951 Long term (current) use of inhaled steroids: Secondary | ICD-10-CM | POA: Diagnosis not present

## 2020-05-10 DIAGNOSIS — I700 Atherosclerosis of aorta: Secondary | ICD-10-CM | POA: Insufficient documentation

## 2020-05-10 DIAGNOSIS — M1990 Unspecified osteoarthritis, unspecified site: Secondary | ICD-10-CM | POA: Diagnosis not present

## 2020-05-10 DIAGNOSIS — Z7952 Long term (current) use of systemic steroids: Secondary | ICD-10-CM | POA: Diagnosis not present

## 2020-05-10 DIAGNOSIS — Z791 Long term (current) use of non-steroidal anti-inflammatories (NSAID): Secondary | ICD-10-CM | POA: Diagnosis not present

## 2020-05-10 DIAGNOSIS — Z7982 Long term (current) use of aspirin: Secondary | ICD-10-CM | POA: Insufficient documentation

## 2020-05-10 DIAGNOSIS — E079 Disorder of thyroid, unspecified: Secondary | ICD-10-CM | POA: Diagnosis not present

## 2020-05-10 DIAGNOSIS — Z79899 Other long term (current) drug therapy: Secondary | ICD-10-CM | POA: Diagnosis not present

## 2020-05-10 DIAGNOSIS — I10 Essential (primary) hypertension: Secondary | ICD-10-CM | POA: Diagnosis not present

## 2020-05-10 LAB — COMPREHENSIVE METABOLIC PANEL
Albumin: 4.4 (ref 3.5–5.0)
Calcium: 9.6 (ref 8.7–10.7)

## 2020-05-10 LAB — CBC AND DIFFERENTIAL
HCT: 39 (ref 36–46)
Hemoglobin: 13.3 (ref 12.0–16.0)
Neutrophils Absolute: 1.99
Platelets: 254 (ref 150–399)
WBC: 3.9

## 2020-05-10 LAB — BASIC METABOLIC PANEL
BUN: 12 (ref 4–21)
CO2: 30 — AB (ref 13–22)
Chloride: 97 — AB (ref 99–108)
Creatinine: 0.6 (ref 0.5–1.1)
Glucose: 258
Potassium: 4.3 (ref 3.4–5.3)
Sodium: 136 — AB (ref 137–147)

## 2020-05-10 LAB — CBC: RBC: 4.63 (ref 3.87–5.11)

## 2020-05-10 LAB — HEPATIC FUNCTION PANEL
ALT: 16 (ref 7–35)
AST: 24 (ref 13–35)
Alkaline Phosphatase: 62 (ref 25–125)
Bilirubin, Total: 0.5

## 2020-05-10 MED ORDER — ONDANSETRON HCL 4 MG PO TABS: 4.0000 mg | ORAL_TABLET | ORAL | 3 refills | Status: DC | PRN

## 2020-05-10 NOTE — Progress Notes (Signed)
[facility]  [address] [place],  [place]  [address] [phone]  Clinic Day:  05/10/2020  Referring physician: Dierdre Searles, MD   This document serves as a record of services personally performed by Hosie Poisson, MD. It was created on their behalf by [facility] E, a trained medical scribe. The creation of this record is based on the scribe's personal observations and the provider's statements to them.   CHIEF COMPLAINT:  CC:  Rectal cancer  Current Treatment:  Will plan for neoadjuvant chemoradiation   HISTORY OF PRESENT ILLNESS:  Tanya Mcclure is a 59 y.o. female referred by Dr. Orrin Brigham for the evaluation and treatment of rectal cancer.  This began when the patient started to experience daily rectal bleeding with bright red blood and urgency.   She had also been having persistent diarrhea for the past two years which gradually worsened over that time.  She was referred to Dr. Melina Copa and underwent colonoscopy on December 10th.  Evaluation showed a rectal mass beginning at about 2 cm from the anal verge and was noted to obstruct 50% of the circumference of the rectum.  Benign polyps were also present and removed.  She had never undergone colonoscopy prior to this procedure.   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   CT abdomen and pelvis from December 14th revealed several small perirectal and presacral lymph nodes, highly concerning for metastatic involvement.  No other evidence of metastatic disease was observed.  She was seen by Dr. Lilia Pro in consultation to discuss surgical resection, and he has recommended neoadjuvant chemoradiation.  CBC and CMP from mid December were unremarkable except for a blood glucose of 341 and an elevated protein of 8.3.    INTERVAL HISTORY:  Tanya Mcclure continues to have significant rectal bleeding to the point where she requires a pad.  She denies rectal pain.  She continues to have diarrhea.  A  port has already been placed.  She states that her diabetic medication has been on hold with her recent diagnosis.  I advised that she resume metformin 500 mg BID as prescribed.  She did have evidence of ulcer on EGD and so she has discontinued ibuprofen, and she has been placed on pantoprazole 40 mg BID.  She does note nausea in the mornings, and has antiemetics as needed.  Blood counts and chemistries today are unremarkable except for a blood glucose of 258 and a total protein of 8.8.  I will add an SPEP for further evaluation.  Her  appetite is good, and she is eating well.  She has lost about 22 pounds.  She denies fever, chills or other signs of infection.  She denies nausea, vomiting, bowel issues, or abdominal pain.  She denies sore throat, cough, dyspnea, or chest pain.   She has not had a mammogram within the past year and has never undergone a bone density scan.  She started menarche at age 10.  She went through menopause after her hysterectomy and was placed on low dose estradiol for about 1 year.    REVIEW OF SYSTEMS:  Review of Systems  Constitutional: Negative.   HENT:  Negative.   Eyes: Negative.   Respiratory: Negative.   Cardiovascular: Negative.   Gastrointestinal: Positive for diarrhea and nausea (in the mornings, well controlled with medication). Negative for rectal pain.  Endocrine: Negative.   Genitourinary: Negative.    Musculoskeletal: Negative.        Pain and tenderness of the  left anterior calf.  Skin: Negative.   Neurological: Negative.   Hematological: Negative.   Psychiatric/Behavioral: Negative.      VITALS:  Blood pressure (!) 160/78, pulse 71, temperature 98.6 F (37 C), resp. rate 16, height _0  (1.6 m), weight 189 lb 8 oz (86 kg), SpO2 95 %.  Wt Readings from Last 3 Encounters:  05/10/20 189 lb 8 oz (86 kg)  10/06/06 196 lb (88.9 kg)    Body mass index is 33.57 kg/m.  Performance status (ECOG): 1 - Symptomatic but completely ambulatory  PHYSICAL  EXAM:  Physical Exam Constitutional:      General: She is not in acute distress.    Appearance: Normal appearance. She is normal weight.  HENT:     Head: Normocephalic and atraumatic.  Eyes:     General: No scleral icterus.    Extraocular Movements: Extraocular movements intact.     Conjunctiva/sclera: Conjunctivae normal.     Pupils: Pupils are equal, round, and reactive to light.  Cardiovascular:     Rate and Rhythm: Normal rate and regular rhythm.     Pulses: Normal pulses.     Heart sounds: Normal heart sounds. No murmur heard. No friction rub. No gallop.   Pulmonary:     Effort: Pulmonary effort is normal. No respiratory distress.     Breath sounds: Normal breath sounds.  Chest:  Breasts:     Right: Normal.     Left: Normal.      Comments: Mild swelling of the port site in the right upper chest with steri-strips in place.  Both breasts are without masses. Abdominal:     General: Bowel sounds are normal. There is no distension.     Palpations: Abdomen is soft. There is no mass.     Tenderness: There is no abdominal tenderness.     Comments: She has an umbilical scar and a right upper quadrant scar which are well healed.  Musculoskeletal:        General: Normal range of motion.     Cervical back: Normal range of motion and neck supple.     Right lower leg: No edema.     Left lower leg: No edema.  Lymphadenopathy:     Cervical: No cervical adenopathy.  Skin:    General: Skin is warm and dry.  Neurological:     General: No focal deficit present.     Mental Status: She is alert and oriented to person, place, and time. Mental status is at baseline.  Psychiatric:        Mood and Affect: Mood normal.        Behavior: Behavior normal.        Thought Content: Thought content normal.        Judgment: Judgment normal.   Rectal exam was deferred as she has had multiple within the month  LABS:   CBC 03/03/2007  WBC 7.7  Hemoglobin 13.1  Hematocrit 37.6  Platelets 204    CMP 03/03/2007  Glucose 135(H)  BUN 13  Creatinine 1.03  Sodium 130(L)  Potassium 3.6  Chloride 95(L)  CO2 26  Calcium 8.7     No results found for: CEA1 / No results found for: CEA1   STUDIES:   She underwent colonoscopy on 04/20/2020 and surgical pathology revealed: 1.  Colon, polyp(s):  -  Inflammatory type polyp (x3 fragments)  -  No dysplasia or malignancy 2.  Colon, biopsy, entire colon:  -  Benign colonic mucosa  -  No active inflammation or evidence of microscopic colitis  -  No dysplasia or malignancy 3.  Rectum, biopsy:  -  Invasive adenocarcinoma. 4.  Stomach, biopsy, entire:  -  Reactive gastropathy with ulceration   -  Warthin-starry is negative for helicobacter pylori  -  No intestinal metaplasia, dysplasia or malignancy  She underwent a CT abdomen and pelvis on 04/24/2020 showing: 1.  Known rectal lesion not well demonstrated by CT.  There are several small perirectal and presacral lymph nodes measuring up to 8 mm short axis.  These are highly concerning for metastatic involvement. 2.  Right middle lobe pulmonary nodules, stable 2016 and consistent with benign etiology. 3.  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 4.  Aortic atherosclerosis.    HISTORY:   Past Medical History:  Diagnosis Date  . Arthritis   . Asthma   . Diabetes mellitus without complication ([place])   . Hypertension   . Thyroid disease     Past Surgical History:  Procedure Laterality Date  . ABDOMINAL HYSTERECTOMY     due to fibroids  . CHOLECYSTECTOMY    . HYSTERECTOMY ABDOMINAL WITH SALPINGO-OOPHORECTOMY    . POLYPECTOMY    . PORTA CATH INSERTION    . TUBAL LIGATION      Family History  Problem Relation Age of Onset  . Diabetes Mother   . Hypertension Mother   . Heart disease Mother   . Heart attack Mother   . Diabetes Father   . Hypertension Father   . Diabetes Sister   . COPD Sister     Social History:  reports that  she has never smoked. She has never used smokeless tobacco. She reports previous alcohol use. She reports that she does not use drugs.The patient is alone today.  She is legally separated and lives at home with her 3 grandchildren, who she cares for.    Allergies:  Allergies  Allergen Reactions  . Penicillins     Current Medications: Current Outpatient Medications  Medication Sig Dispense Refill  . Accu-Chek Softclix Lancets lancets USE TO CHECK BLOOD GLUCOSE TWICE DAILY.    Marland Kitchen acetaminophen (TYLENOL) 500 MG tablet Take by mouth.    Marland Kitchen albuterol (ACCUNEB) 0.63 MG/3ML nebulizer solution Inhale into the lungs.    Marland Kitchen albuterol (VENTOLIN HFA) 108 (90 Base) MCG/ACT inhaler Inhale into the lungs.    Marland Kitchen aspirin 81 MG EC tablet Take by mouth.    Marland Kitchen atorvastatin (LIPITOR) 40 MG tablet Take by mouth.    . budesonide-formoterol (SYMBICORT) 160-4.5 MCG/ACT inhaler Inhale into the lungs.    . ergocalciferol (VITAMIN D2) 1.25 MG (50000 UT) capsule Take by mouth.    . Exenatide ER 2 MG PEN Inject into the skin.    . furosemide (LASIX) 20 MG tablet Take 1 tablet by mouth daily.    Marland Kitchen gabapentin (NEURONTIN) 300 MG capsule TAKE 1 CAPSULE BY MOUTH TWICE DAILY AS NEEDED    . glucose blood (ACCU-CHEK AVIVA PLUS) test strip Check blood glucose twice daily and as needed. Dx E 11.65    . levothyroxine (SYNTHROID) 100 MCG tablet TAKE ONE TABLET BY MOUTH ONCE DAILY AT 6AM.    . metFORMIN (GLUCOPHAGE-XR) 500 MG 24 hr tablet Take 1 tablet by mouth 2 (two) times daily with a meal.    . montelukast (SINGULAIR) 10 MG tablet Take by mouth.    . meloxicam (MOBIC) 15 MG tablet Take 1 tablet (15 mg total) by mouth  daily. 30 tablet 0  . methylPREDNISolone (MEDROL DOSEPAK) 4 MG TBPK tablet Take 1st as instructed 21 tablet 0  . oxyCODONE (OXY IR/ROXICODONE) 5 MG immediate release tablet Take 5 mg by mouth every 4 (four) hours as needed.    . pantoprazole (PROTONIX) 40 MG tablet Take 40 mg by mouth 2 (two) times daily.    Marland Kitchen SUTAB  [phone] MG TABS TAKE 24 TABLETS BY MOUTH AS DIRECTED.     Current Facility-Administered Medications  Medication Dose Route Frequency Provider Last Rate Last Admin  . triamcinolone acetonide (KENALOG) 10 MG/ML injection 10 mg  10 mg Other Once [place], [place], DPM      . triamcinolone acetonide (KENALOG-40) injection 20 mg  20 mg Other Once Landis Martins, DPM         ASSESSMENT & PLAN:   Assessment:   1.  Recently diagnosed rectal cancer, diagnosed in December 2021.  She has several small perirectal and presacral lymph nodes, highly concerning for metastatic involvement, but no other evidence of metastatic disease was observed.  This is likely a stage III.  She was seen by Dr. Lilia Pro who recommends neoadjuvant chemoradiation prior to surgical resection.   2.  Benign polyps, removed at time of colonoscopy.  3.  Rectal bleeding and diarrhea, associated with malignancy.  4.  Persistent elevated total protein.  I will order an SPEP for further evaluation.    5.  Pain and tenderness of the left anterior tibia.  Ultrasound was negative for thrombus.    Plan: This is pleasant 59 year old female recently diagnosed with rectal cancer this month.  She has several small perirectal and presacral lymph nodes, highly concerning for metastatic involvement, but no other evidence of metastatic disease was observed.  Likely this is a stage III.  She has met with Dr. Orlene Erm to discuss radiation therapy and they will be proceeding with a 5 week course starting in early January.  Simulation was done today.  Neoadjuvant chemotherapy has been recommended in conjunction with radiation and we reviewed this today.  I recommend cisplatin on day 1 with 5 FU infusion to be given on the 1st and 5th week and we will aim to get started on January 10th.  The schedule and potential toxicities were explained, and we will schedule her with Melissa for chemo-education next week.  As she has had a consistently elevated  protein level, I will order an SPEP for further evaluation.  Since she takes care of her three young grandchildren, we will have Denyse Amass our social worker speak with her for advise on how to explain her diagnosis to her family.  I did recommend that she receive the COVID-19 vaccine.  She understands and agrees with this plan of care.  I have answered her questions and she knows to call with any concerns.  Thank you for the opportunity to participate in the care of your patients.   I provided 50 minutes of face-to-face time during this this encounter and > 50% was spent counseling as documented under my assessment and plan.    Derwood Kaplan, MD [facility] AT [facility] [address] [place] [address] Dept: [phone] Dept Fax: [phone]   I, Rita Ohara, am acting as scribe for Derwood Kaplan, MD  I have reviewed this report as typed by the medical scribe, and it is complete and accurate.

## 2020-05-11 LAB — CEA: CEA: 56.9 ng/mL — ABNORMAL HIGH (ref 0.0–4.7)

## 2020-05-14 ENCOUNTER — Telehealth: Payer: Self-pay | Admitting: Hematology and Oncology

## 2020-05-16 ENCOUNTER — Other Ambulatory Visit: Payer: Self-pay | Admitting: Oncology

## 2020-05-16 DIAGNOSIS — C20 Malignant neoplasm of rectum: Secondary | ICD-10-CM

## 2020-05-18 ENCOUNTER — Other Ambulatory Visit: Payer: Self-pay | Admitting: Hematology and Oncology

## 2020-05-18 ENCOUNTER — Inpatient Hospital Stay: Payer: Medicaid Other | Attending: Oncology | Admitting: Hematology and Oncology

## 2020-05-18 ENCOUNTER — Inpatient Hospital Stay: Payer: Medicaid Other

## 2020-05-18 VITALS — BP 131/62 | HR 96 | Temp 98.7°F | Resp 18 | Ht 63.0 in | Wt 189.8 lb

## 2020-05-18 DIAGNOSIS — Z7952 Long term (current) use of systemic steroids: Secondary | ICD-10-CM | POA: Insufficient documentation

## 2020-05-18 DIAGNOSIS — Z791 Long term (current) use of non-steroidal anti-inflammatories (NSAID): Secondary | ICD-10-CM | POA: Insufficient documentation

## 2020-05-18 DIAGNOSIS — Z7984 Long term (current) use of oral hypoglycemic drugs: Secondary | ICD-10-CM | POA: Insufficient documentation

## 2020-05-18 DIAGNOSIS — Z8711 Personal history of peptic ulcer disease: Secondary | ICD-10-CM | POA: Insufficient documentation

## 2020-05-18 DIAGNOSIS — R197 Diarrhea, unspecified: Secondary | ICD-10-CM | POA: Insufficient documentation

## 2020-05-18 DIAGNOSIS — Z7951 Long term (current) use of inhaled steroids: Secondary | ICD-10-CM | POA: Insufficient documentation

## 2020-05-18 DIAGNOSIS — Z7982 Long term (current) use of aspirin: Secondary | ICD-10-CM | POA: Insufficient documentation

## 2020-05-18 DIAGNOSIS — Z79899 Other long term (current) drug therapy: Secondary | ICD-10-CM | POA: Insufficient documentation

## 2020-05-18 DIAGNOSIS — D649 Anemia, unspecified: Secondary | ICD-10-CM | POA: Insufficient documentation

## 2020-05-18 DIAGNOSIS — C20 Malignant neoplasm of rectum: Secondary | ICD-10-CM | POA: Insufficient documentation

## 2020-05-18 DIAGNOSIS — R300 Dysuria: Secondary | ICD-10-CM | POA: Insufficient documentation

## 2020-05-18 DIAGNOSIS — Z5111 Encounter for antineoplastic chemotherapy: Secondary | ICD-10-CM | POA: Insufficient documentation

## 2020-05-18 LAB — BASIC METABOLIC PANEL
BUN: 15 (ref 4–21)
CO2: 29 — AB (ref 13–22)
Chloride: 98 — AB (ref 99–108)
Creatinine: 0.9 (ref 0.5–1.1)
Glucose: 328
Potassium: 3.8 (ref 3.4–5.3)
Sodium: 136 — AB (ref 137–147)

## 2020-05-18 LAB — CBC AND DIFFERENTIAL
HCT: 39 (ref 36–46)
Hemoglobin: 13.4 (ref 12.0–16.0)
Neutrophils Absolute: 3.78
Platelets: 289 (ref 150–399)
WBC: 5.9

## 2020-05-18 LAB — HEPATIC FUNCTION PANEL
ALT: 17 (ref 7–35)
AST: 25 (ref 13–35)
Alkaline Phosphatase: 58 (ref 25–125)
Bilirubin, Total: 0.6

## 2020-05-18 LAB — COMPREHENSIVE METABOLIC PANEL
Albumin: 4.3 (ref 3.5–5.0)
Calcium: 9.7 (ref 8.7–10.7)

## 2020-05-18 LAB — CBC: RBC: 4.67 (ref 3.87–5.11)

## 2020-05-18 MED ORDER — ONDANSETRON HCL 4 MG PO TABS: 4.0000 mg | ORAL_TABLET | ORAL | 3 refills | Status: DC | PRN

## 2020-05-18 MED ORDER — PROCHLORPERAZINE MALEATE 10 MG PO TABS: 10.0000 mg | ORAL_TABLET | Freq: Four times a day (QID) | ORAL | 3 refills | Status: DC | PRN

## 2020-05-18 NOTE — Progress Notes (Signed)
The patient is 60 year old female  with newly diagnosed rectal cancer.  Patient presents to clinic today for chemotherapy education and palliative care consult.  We will start fluorouracil.  We will send in prescriptions for prochlorperazine and ondansetron.  The patient verbalizes understanding of and agreement to the plan as discussed today.  Provided general information including the following: 1.  Date of education: 05-18-2020 2.  Physician name: Dr. Hinton Rao 3.  Diagnosis: Rectal Cancer 4.  Stage: 5.  Curative  6.  Chemotherapy plan including drugs and how often: Fluorouracil 7.  Start date: 05-21-2020 8.  Other referrals: No other referrals at this time 9.  The patient is to call our office with any questions or concerns.  Our office number [phone], if after hours or on the weekend, call the same number and wait for the answering service.  There is always an oncologist on call 10.  Medications prescribed: 11.  The patient has verbalized understanding of the treatment plan and has no barriers to adherence or understanding.  Obtained signed consent from patient.  Discussed symptoms including 1.  Low blood counts including red blood cells, white blood cells and platelets. 2. Infection including to avoid large crowds, wash hands frequently, and stay away from people who were sick.  If fever develops of 100.4 or higher, call our office. 3.  Mucositis-given instructions on mouth rinse (baking soda and salt mixture).  Keep mouth clean.  Use soft bristle toothbrush.  If mouth sores develop, call our clinic. 4.  Nausea/vomiting-gave prescriptions for ondansetron 4 mg every 4 hours as needed for nausea, may take around the clock if persistent.  Compazine 10 mg every 6 hours, may take around the clock if persistent. 5.  Diarrhea-use over-the-counter Imodium.  Call clinic if not controlled. 6.  Constipation-use senna, 1 to 2 tablets twice a day.  If no BM in 2 to 3 days call the clinic. 7.   Loss of appetite-try to eat small meals every 2-3 hours.  Call clinic if not eating. 8.  Taste changes-zinc 500 mg daily.  If becomes severe call clinic. 9.  Alcoholic beverages. 10.  Drink 2 to 3 quarts of water per day. 11.  Peripheral neuropathy-patient to call if numbness or tingling in hands or feet is persistent   Gave information on the supportive care team and how to contact them regarding services.  Discussed advanced directives.  The patient does not have their advanced directives but will look at the copy provided in their notebook and will call with any questions. [employer] Social worker Advanced directives  Answered questions to patient satisfaction.  Patient is to call with any further questions or concerns.  Time spent on this palliative care/chemotherapy education was 60 minutes with more than 50% spent discussing diagnosis, prognosis and symptom management.

## 2020-05-21 ENCOUNTER — Other Ambulatory Visit: Payer: Self-pay

## 2020-05-21 ENCOUNTER — Inpatient Hospital Stay: Payer: Medicaid Other

## 2020-05-21 VITALS — BP 166/82 | HR 85 | Temp 98.5°F | Resp 18 | Ht 63.0 in | Wt 191.2 lb

## 2020-05-21 DIAGNOSIS — Z7951 Long term (current) use of inhaled steroids: Secondary | ICD-10-CM | POA: Diagnosis not present

## 2020-05-21 DIAGNOSIS — C20 Malignant neoplasm of rectum: Secondary | ICD-10-CM

## 2020-05-21 DIAGNOSIS — R197 Diarrhea, unspecified: Secondary | ICD-10-CM | POA: Diagnosis not present

## 2020-05-21 DIAGNOSIS — Z8711 Personal history of peptic ulcer disease: Secondary | ICD-10-CM | POA: Diagnosis not present

## 2020-05-21 DIAGNOSIS — Z791 Long term (current) use of non-steroidal anti-inflammatories (NSAID): Secondary | ICD-10-CM | POA: Diagnosis not present

## 2020-05-21 DIAGNOSIS — D649 Anemia, unspecified: Secondary | ICD-10-CM | POA: Diagnosis not present

## 2020-05-21 DIAGNOSIS — Z5111 Encounter for antineoplastic chemotherapy: Secondary | ICD-10-CM | POA: Diagnosis not present

## 2020-05-21 DIAGNOSIS — R300 Dysuria: Secondary | ICD-10-CM | POA: Diagnosis not present

## 2020-05-21 DIAGNOSIS — Z7982 Long term (current) use of aspirin: Secondary | ICD-10-CM | POA: Diagnosis not present

## 2020-05-21 DIAGNOSIS — Z79899 Other long term (current) drug therapy: Secondary | ICD-10-CM | POA: Diagnosis not present

## 2020-05-21 DIAGNOSIS — Z7952 Long term (current) use of systemic steroids: Secondary | ICD-10-CM | POA: Diagnosis not present

## 2020-05-21 DIAGNOSIS — Z7984 Long term (current) use of oral hypoglycemic drugs: Secondary | ICD-10-CM | POA: Diagnosis not present

## 2020-05-21 MED ORDER — SODIUM CHLORIDE 0.9 % IV SOLN
350.0000 mg/m2/d | INTRAVENOUS | Status: DC
  Administered 2020-05-21: 2750 mg via INTRAVENOUS
  Filled 2020-05-21: qty 55

## 2020-05-21 NOTE — Patient Instructions (Signed)
[facility] Discharge Instructions for Patients Receiving Chemotherapy  Today you received the following chemotherapy agents 5FLUOROURACIL  To help prevent nausea and vomiting after your treatment, we encourage you to take your nausea medication.   If you develop nausea and vomiting that is not controlled by your nausea medication, call the clinic.   BELOW ARE SYMPTOMS THAT SHOULD BE REPORTED IMMEDIATELY:  *FEVER GREATER THAN 100.5 F  *CHILLS WITH OR WITHOUT FEVER  NAUSEA AND VOMITING THAT IS NOT CONTROLLED WITH YOUR NAUSEA MEDICATION  *UNUSUAL SHORTNESS OF BREATH  *UNUSUAL BRUISING OR BLEEDING  TENDERNESS IN MOUTH AND THROAT WITH OR WITHOUT PRESENCE OF ULCERS  *URINARY PROBLEMS  *BOWEL PROBLEMS  UNUSUAL RASH Items with * indicate a potential emergency and should be followed up as soon as possible.  Feel free to call the clinic should you have any questions or concerns at The clinic phone number is [phone].  Please show the CHEMO ALERT CARD at check-in to the Emergency Department and triage nurse.   

## 2020-05-21 NOTE — Progress Notes (Signed)
PT STABLE AT TIME OF DISCHARGE. PT GIVEN INFORMATION REGARDING HER AMBULATORY PUMP AND SHE ALSO RECEIVED HER CHEMOTHERAPY SPILL KIT AND HOW TO USE IT TODAY. THE PATIENT UNDERSTANDS TO CALL IF SHE HAS ANY PROBLEMS REGARDING THE ABOVE INFORMATION. 

## 2020-05-22 ENCOUNTER — Telehealth: Payer: Self-pay

## 2020-05-22 NOTE — Telephone Encounter (Signed)
I spoke with pt to see how she tolerated her first infusion yesterday. Pt states she did ok, only 1 episode of nausea, and she took something for it. Denies emesis, diarrhea,and rashes. No issues with 5FU pump. Reminded pt of importance of calling us if temp over 100.4 day or night. Pt verbalized understanding.

## 2020-05-25 ENCOUNTER — Inpatient Hospital Stay: Payer: Medicaid Other

## 2020-05-25 ENCOUNTER — Other Ambulatory Visit: Payer: Self-pay

## 2020-05-25 VITALS — BP 146/70 | HR 88 | Temp 98.6°F | Resp 18 | Ht 63.0 in | Wt 188.0 lb

## 2020-05-25 DIAGNOSIS — Z5111 Encounter for antineoplastic chemotherapy: Secondary | ICD-10-CM | POA: Diagnosis not present

## 2020-05-25 DIAGNOSIS — C20 Malignant neoplasm of rectum: Secondary | ICD-10-CM

## 2020-05-25 MED ORDER — HEPARIN SOD (PORK) LOCK FLUSH 100 UNIT/ML IV SOLN
500.0000 [IU] | Freq: Once | INTRAVENOUS | Status: AC | PRN
  Administered 2020-05-25: 500 [IU]
  Filled 2020-05-25: qty 5

## 2020-05-25 MED ORDER — SODIUM CHLORIDE 0.9% FLUSH
10.0000 mL | INTRAVENOUS | Status: DC | PRN
  Administered 2020-05-25: 10 mL
  Filled 2020-05-25: qty 10

## 2020-05-25 NOTE — Progress Notes (Signed)
Pt came in today for pump DC, states NV today since 6 AM. Also says at 6 AM she had a fever of 101.6  Used home meds for Nausea, with some relief. Temp now is normal, see VS Kelli made aware of this info.  Instructed pt to have COVID test if fever reoccurs, see PCP. Call on call MD here if symtoms reoccur over the weekend. Pt is agreeable and has phone numbers if needed.

## 2020-05-31 ENCOUNTER — Other Ambulatory Visit: Payer: Self-pay | Admitting: Hematology and Oncology

## 2020-05-31 ENCOUNTER — Inpatient Hospital Stay: Payer: Medicaid Other

## 2020-05-31 ENCOUNTER — Other Ambulatory Visit: Payer: Self-pay

## 2020-05-31 DIAGNOSIS — C20 Malignant neoplasm of rectum: Secondary | ICD-10-CM

## 2020-05-31 LAB — BASIC METABOLIC PANEL
BUN: 12 (ref 4–21)
CO2: 30 — AB (ref 13–22)
Chloride: 95 — AB (ref 99–108)
Creatinine: 0.7 (ref 0.5–1.1)
Glucose: 239
Potassium: 3.2 — AB (ref 3.4–5.3)
Sodium: 134 — AB (ref 137–147)

## 2020-05-31 LAB — CBC AND DIFFERENTIAL
HCT: 37 (ref 36–46)
Hemoglobin: 12.7 (ref 12.0–16.0)
Neutrophils Absolute: 2.75
Platelets: 233 (ref 150–399)
WBC: 4.3

## 2020-05-31 LAB — HEPATIC FUNCTION PANEL
ALT: 15 (ref 7–35)
AST: 24 (ref 13–35)
Alkaline Phosphatase: 62 (ref 25–125)
Bilirubin, Total: 0.6

## 2020-05-31 LAB — COMPREHENSIVE METABOLIC PANEL
Albumin: 4.4 (ref 3.5–5.0)
Calcium: 9.7 (ref 8.7–10.7)

## 2020-05-31 LAB — CBC: RBC: 4.47 (ref 3.87–5.11)

## 2020-06-01 ENCOUNTER — Inpatient Hospital Stay: Payer: Medicaid Other

## 2020-06-01 ENCOUNTER — Other Ambulatory Visit: Payer: Self-pay

## 2020-06-01 VITALS — BP 134/61 | HR 71 | Temp 98.6°F | Resp 18 | Ht 63.0 in | Wt 176.5 lb

## 2020-06-01 DIAGNOSIS — C20 Malignant neoplasm of rectum: Secondary | ICD-10-CM

## 2020-06-01 DIAGNOSIS — Z5111 Encounter for antineoplastic chemotherapy: Secondary | ICD-10-CM | POA: Diagnosis not present

## 2020-06-01 MED ORDER — POTASSIUM CHLORIDE 10 MEQ/100ML IV SOLN
INTRAVENOUS | Status: AC
  Filled 2020-06-01: qty 200

## 2020-06-01 MED ORDER — POTASSIUM CHLORIDE 10 MEQ/100ML IV SOLN
10.0000 meq | INTRAVENOUS | Status: AC
  Administered 2020-06-01 (×2): 10 meq via INTRAVENOUS

## 2020-06-01 MED ORDER — HEPARIN SOD (PORK) LOCK FLUSH 100 UNIT/ML IV SOLN
500.0000 [IU] | Freq: Once | INTRAVENOUS | Status: AC | PRN
  Administered 2020-06-01: 500 [IU]
  Filled 2020-06-01: qty 5

## 2020-06-01 NOTE — Progress Notes (Signed)
[facility]  [address] [place],  [place]  [address] [phone]  Clinic Day:  06/04/2020  Referring physician: Selena Batten *   This document serves as a record of services personally performed by Gery Pray, MD. It was created on their behalf by Curry,Lauren E, a trained medical scribe. The creation of this record is based on the scribe's personal observations and the provider's statements to them.   CHIEF COMPLAINT:  CC:  Rectal cancer  Current Treatment:  Neoadjuvant chemoradiation with 5 FU infusion of the 1st and 5th week   HISTORY OF PRESENT ILLNESS:  Tanya Mcclure is a 60 y.o. female referred by Dr. Marvis Moeller for the evaluation and treatment of rectal cancer.  This began when the patient started to experience daily rectal bleeding with bright red blood and urgency.   Tanya Mcclure had also been having persistent diarrhea for the past two years which gradually worsened over that time.  Tanya Mcclure was referred to Dr. Charm Barges and underwent colonoscopy on December 10th.  Evaluation showed a rectal mass beginning at about 2 cm from the anal verge and was noted to obstruct 50% of the circumference of the rectum.  Benign polyps were also present and removed.  Tanya Mcclure had never undergone colonoscopy prior to this procedure.   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   CT abdomen and pelvis from December 14th revealed several small perirectal and presacral lymph nodes, highly concerning for metastatic involvement.  No other evidence of metastatic disease was observed.  Tanya Mcclure was seen by Dr. Lequita Halt in consultation to discuss surgical resection, and he has recommended neoadjuvant chemoradiation.  Tanya Mcclure continues to have significant rectal bleeding to the point where Tanya Mcclure requires a pad.  Tanya Mcclure did have evidence of ulcer on EGD and so Tanya Mcclure has discontinued ibuprofen, and Tanya Mcclure has been placed on pantoprazole 40 mg BID.  Tanya Mcclure has not had a mammogram within the past  year and has never undergone a bone density scan.    INTERVAL HISTORY:  Tanya Mcclure is here for routine follow up after starting concurrent chemoradiation.  Tanya Mcclure started Tanya Mcclure 5FU infusion on January 10th.  Tanya Mcclure tolerated this without significant difficulty, and states that Tanya Mcclure diarrhea and appetite has improved.  Tanya Mcclure also notes the rectal bleeding has subsided.  Tanya Mcclure does note some burning of the front pelvis and mild dysuria.  Tanya Mcclure hemoglobin has decreased from 12.7 to 11.8, and Tanya Mcclure white count and platelets are normal.  Chemistries are unremarkable except for a blood glucose of 222.  Tanya Mcclure  appetite is better, but Tanya Mcclure has lost nearly 7 pounds since Tanya Mcclure last visit.  Tanya Mcclure denies fever, chills or other signs of infection.  Tanya Mcclure denies nausea, vomiting, or abdominal pain.  Tanya Mcclure denies sore throat, cough, dyspnea, or chest pain.  REVIEW OF SYSTEMS:  Review of Systems  Constitutional: Positive for appetite change (improved).  HENT:  Negative.   Eyes: Negative.   Respiratory: Negative.   Cardiovascular: Negative.   Gastrointestinal: Positive for diarrhea (improved). Negative for blood in stool.       Rectal bleeding has subsided  Endocrine: Negative.   Genitourinary: Positive for dysuria (very mild).   Musculoskeletal: Negative.   Skin: Negative.   Neurological: Negative.   Hematological: Negative.   Psychiatric/Behavioral: Negative.      VITALS:  Blood pressure 127/60, pulse 80, temperature 98.8 F (37.1 C), temperature source Oral, resp. rate 18, height 5\' 3"  (1.6 m), weight 183 lb (83 kg), SpO2 99 %.  Wt Readings from Last 3 Encounters:  06/04/20 183 lb (83 kg)  06/01/20 176 lb 8 oz (80.1 kg)  05/25/20 188 lb (85.3 kg)    Body mass index is 32.42 kg/m.  Performance status (ECOG): 1 - Symptomatic but completely ambulatory  PHYSICAL EXAM:  Physical Exam Constitutional:      General: Tanya Mcclure is not in acute distress.    Appearance: Normal appearance. Tanya Mcclure is normal weight.  HENT:     Head:  Normocephalic and atraumatic.  Eyes:     General: No scleral icterus.    Extraocular Movements: Extraocular movements intact.     Conjunctiva/sclera: Conjunctivae normal.     Pupils: Pupils are equal, round, and reactive to light.  Cardiovascular:     Rate and Rhythm: Normal rate and regular rhythm.     Pulses: Normal pulses.     Heart sounds: Normal heart sounds. No murmur heard. No friction rub. No gallop.   Pulmonary:     Effort: Pulmonary effort is normal. No respiratory distress.     Breath sounds: Normal breath sounds.  Abdominal:     General: Bowel sounds are normal. There is no distension.     Palpations: Abdomen is soft. There is no mass.     Tenderness: There is abdominal tenderness (epigastric, mild).  Musculoskeletal:        General: Normal range of motion.     Cervical back: Normal range of motion and neck supple.     Right lower leg: No edema.     Left lower leg: No edema.  Lymphadenopathy:     Cervical: No cervical adenopathy.  Skin:    General: Skin is warm and dry.  Neurological:     General: No focal deficit present.     Mental Status: Tanya Mcclure is alert and oriented to person, place, and time. Mental status is at baseline.  Psychiatric:        Mood and Affect: Mood normal.        Behavior: Behavior normal.        Thought Content: Thought content normal.        Judgment: Judgment normal.     LABS:   CBC Latest Ref Rng & Units 05/31/2020 05/18/2020 05/10/2020  WBC - 4.3 5.9 3.9  Hemoglobin 12.0 - 16.0 12.7 13.4 13.3  Hematocrit 36 - 46 37 39 39  Platelets 150 - 399 233 289 254   CMP Latest Ref Rng & Units 05/31/2020 05/18/2020 05/10/2020  Glucose - - - -  BUN 4 - $R'21 12 15 12  'dA$ Creatinine 0.5 - 1.1 0.7 0.9 0.6  Sodium 137 - 147 134(A) 136(A) 136(A)  Potassium 3.4 - 5.3 3.2(A) 3.8 4.3  Chloride 99 - 108 95(A) 98(A) 97(A)  CO2 13 - 22 30(A) 29(A) 30(A)  Calcium 8.7 - 10.7 9.7 9.7 9.6  Alkaline Phos 25 - 125 62 58 62  AST 13 - 35 $Re'24 25 24  'qfw$ ALT 7 - 35 $Re'15 17 16      'XWP$ Lab Results  Component Value Date   CEA1 56.9 (H) 05/10/2020   /  CEA  Date Value Ref Range Status  05/10/2020 56.9 (H) 0.0 - 4.7 ng/mL Final    Comment:    (NOTE)                             Nonsmokers          <3.9  Smokers             <5.6 Roche [employer] Electrochemiluminescence Immunoassay (ECLIA) Values obtained with different assay methods or kits cannot be used interchangeably.  Results cannot be interpreted as absolute evidence of the presence or absence of malignant disease. Performed At: [facility] [address] [place], [place] [id number] Jolene Schimke MD FB:[phone]      STUDIES:   No current studies  HISTORY:     Allergies:  Allergies  Allergen Reactions  . Penicillins     Current Medications: Current Outpatient Medications  Medication Sig Dispense Refill  . Lancets Misc. (ACCU-CHEK FASTCLIX LANCET) KIT Check blood glucose twice daily. Dx E 11.65    . Accu-Chek Softclix Lancets lancets USE TO CHECK BLOOD GLUCOSE TWICE DAILY.    Marland Kitchen acetaminophen (TYLENOL) 500 MG tablet Take by mouth.    Marland Kitchen albuterol (ACCUNEB) 0.63 MG/3ML nebulizer solution Inhale into the lungs.    Marland Kitchen albuterol (VENTOLIN HFA) 108 (90 Base) MCG/ACT inhaler Inhale into the lungs.    Marland Kitchen aspirin 81 MG EC tablet Take by mouth.    Marland Kitchen atorvastatin (LIPITOR) 40 MG tablet Take by mouth.    . budesonide-formoterol (SYMBICORT) 160-4.5 MCG/ACT inhaler Inhale into the lungs.    . ergocalciferol (VITAMIN D2) 1.25 MG (50000 UT) capsule Take by mouth.    . Exenatide ER 2 MG PEN Inject into the skin.    . furosemide (LASIX) 20 MG tablet Take 1 tablet by mouth daily.    Marland Kitchen gabapentin (NEURONTIN) 300 MG capsule TAKE 1 CAPSULE BY MOUTH TWICE DAILY AS NEEDED    . glucose blood (ACCU-CHEK AVIVA PLUS) test strip Check blood glucose twice daily and as needed. Dx E 11.65    . levothyroxine (SYNTHROID) 100 MCG tablet TAKE ONE TABLET BY MOUTH ONCE DAILY AT 6AM.    .  LORazepam (ATIVAN) 0.5 MG tablet Take by mouth.    . meloxicam (MOBIC) 15 MG tablet Take 1 tablet (15 mg total) by mouth daily. 30 tablet 0  . metFORMIN (GLUCOPHAGE-XR) 500 MG 24 hr tablet Take 1 tablet by mouth 2 (two) times daily with a meal.    . methylPREDNISolone (MEDROL DOSEPAK) 4 MG TBPK tablet Take 1st as instructed 21 tablet 0  . montelukast (SINGULAIR) 10 MG tablet Take by mouth.    . ondansetron (ZOFRAN) 4 MG tablet Take 1 tablet (4 mg total) by mouth every 4 (four) hours as needed for nausea. 90 tablet 3  . oxyCODONE (OXY IR/ROXICODONE) 5 MG immediate release tablet Take 5 mg by mouth every 4 (four) hours as needed.    . pantoprazole (PROTONIX) 40 MG tablet Take 40 mg by mouth 2 (two) times daily.    . prochlorperazine (COMPAZINE) 10 MG tablet Take 1 tablet (10 mg total) by mouth every 6 (six) hours as needed for nausea or vomiting. 90 tablet 3  . SUTAB [phone] MG TABS TAKE 24 TABLETS BY MOUTH AS DIRECTED.     Current Facility-Administered Medications  Medication Dose Route Frequency Provider Last Rate Last Admin  . triamcinolone acetonide (KENALOG) 10 MG/ML injection 10 mg  10 mg Other Once [place], [place], DPM      . triamcinolone acetonide (KENALOG-40) injection 20 mg  20 mg Other Once Asencion Islam, DPM         ASSESSMENT & PLAN:   Assessment:   1.  Rectal cancer, diagnosed in December 2021.  Tanya Mcclure has several small perirectal and presacral lymph nodes, highly concerning for metastatic involvement,  but no other evidence of metastatic disease was observed.  This is likely a stage III based on the adenopathy seen.  Tanya Mcclure started chemoradiation in early January.  Baseline CEA was 56.9.  2.  Benign polyps, removed at time of colonoscopy.  3.  Rectal bleeding and diarrhea, associated with malignancy, improved.  The rectal bleeding has subsided at this time.  4.  Persistent elevated total protein.  I will order an SPEP for further evaluation.   5.  Mild anemia, secondary to  above.    6.  History of peptic ulcer disease.  Plan: Tanya Mcclure has started concurrent chemoradiation and received Tanya Mcclure 1st 5FU infusion on January 10th.   Tanya Mcclure states that Tanya Mcclure tolerated treatment without significant difficulty, and Tanya Mcclure has had improvement in Tanya Mcclure diarrhea and appetite.  Tanya Mcclure will be due for Tanya Mcclure 2nd dose on February 7th.  We will see Tanya Mcclure back on February 4th with CBC and CMP for repeat examination.  Tanya Mcclure understands and agrees with this plan of care.  I have answered Tanya Mcclure questions and Tanya Mcclure knows to call with any concerns.   I provided 20 minutes of face-to-face time during this this encounter and > 50% was spent counseling as documented under my assessment and plan.    Derwood Kaplan, MD [facility] [address] [place] [address] Dept: [phone] Dept Fax: [phone]   I, Rita Ohara, am acting as scribe for Derwood Kaplan, MD  I have reviewed this report as typed by the medical scribe, and it is complete and accurate.

## 2020-06-01 NOTE — Patient Instructions (Signed)
Potassium chloride injection What is this medicine? POTASSIUM CHLORIDE (poe TASS i um KLOOR ide) is a potassium supplement used to prevent and to treat low potassium. Potassium is important for the heart, muscles, and nerves. Too much or too little potassium in the body can cause serious problems. This medicine may be used for other purposes; ask your health care provider or pharmacist if you have questions. COMMON BRAND NAME(S): PROAMP What should I tell my health care provider before I take this medicine? They need to know if you have any of these conditions:  Addison disease  dehydration  diabetes (high blood sugar)  heart disease  high levels of potassium in the blood  irregular heartbeat or rhythm  kidney disease  large areas of burned skin  an unusual or allergic reaction to potassium, other medicines, foods, dyes, or preservatives  pregnant or trying to get pregnant  breast-feeding How should I use this medicine? This medicine is injected into a vein. It is given by a health care provider in a hospital or clinic setting. Talk to your health care provider about the use of this medicine in children. Special care may be needed. Overdosage: If you think you have taken too much of this medicine contact a poison control center or emergency room at once. NOTE: This medicine is only for you. Do not share this medicine with others. What if I miss a dose? This does not apply. This medicine is not for regular use. What may interact with this medicine? Do not take this medicine with any of the following medications:  certain diuretics such as spironolactone, triamterene  eplerenone  sodium polystyrene sulfonate This medicine may also interact with the following medications:  certain medicines for blood pressure or heart disease like lisinopril, losartan, quinapril, valsartan  medicines that lower your chance of fighting infection such as cyclosporine, tacrolimus  NSAIDs,  medicines for pain and inflammation, like ibuprofen or naproxen  other potassium supplements  salt substitutes This list may not describe all possible interactions. Give your health care provider a list of all the medicines, herbs, non-prescription drugs, or dietary supplements you use. Also tell them if you smoke, drink alcohol, or use illegal drugs. Some items may interact with your medicine. What should I watch for while using this medicine? Visit your health care provider for regular checks on your progress. Tell your health care provider if your symptoms do not start to get better or if they get worse. You may need blood work while you are taking this medicine. Avoid salt substitutes unless you are told otherwise by your health care provider. What side effects may I notice from receiving this medicine? Side effects that you should report to your doctor or health care professional as soon as possible:  allergic reactions (skin rash, itching, hives, swelling of the face, lips, tongue, or throat)  confusion  high potassium levels (muscle weakness, fast or irregular heartbeat)  low blood pressure (dizziness, feeling faint or lightheaded, blurry vision)  pain, tingling, or numbness in lips, hands, or feet  pain, redness, or irritation at site where injected  trouble breathing Side effects that usually do not require medical attention (report to your doctor or health care professional if they continue or are bothersome):  diarrhea  nausea, vomiting  passing gas  stomach pain This list may not describe all possible side effects. Call your doctor for medical advice about side effects. You may report side effects to FDA at 1-800-FDA-1088. Where should I keep my   medicine? This medicine is given in a hospital or clinic. It will not be stored at home. NOTE: This sheet is a summary. It may not cover all possible information. If you have questions about this medicine, talk to your  doctor, pharmacist, or health care provider.  2021 Elsevier/Gold Standard (2019-02-24 18:18:09)  

## 2020-06-04 ENCOUNTER — Other Ambulatory Visit: Payer: Self-pay | Admitting: Oncology

## 2020-06-04 ENCOUNTER — Other Ambulatory Visit: Payer: Self-pay

## 2020-06-04 ENCOUNTER — Telehealth: Payer: Self-pay | Admitting: Oncology

## 2020-06-04 ENCOUNTER — Inpatient Hospital Stay: Payer: Medicaid Other

## 2020-06-04 ENCOUNTER — Encounter: Payer: Self-pay | Admitting: Oncology

## 2020-06-04 ENCOUNTER — Inpatient Hospital Stay (INDEPENDENT_AMBULATORY_CARE_PROVIDER_SITE_OTHER): Payer: Medicaid Other | Admitting: Oncology

## 2020-06-04 ENCOUNTER — Other Ambulatory Visit: Payer: Self-pay | Admitting: Hematology and Oncology

## 2020-06-04 VITALS — BP 127/60 | HR 80 | Temp 98.8°F | Resp 18 | Ht 63.0 in | Wt 183.0 lb

## 2020-06-04 DIAGNOSIS — E8809 Other disorders of plasma-protein metabolism, not elsewhere classified: Secondary | ICD-10-CM

## 2020-06-04 DIAGNOSIS — C20 Malignant neoplasm of rectum: Secondary | ICD-10-CM

## 2020-06-04 LAB — HEPATIC FUNCTION PANEL
ALT: 18 (ref 7–35)
AST: 22 (ref 13–35)
Alkaline Phosphatase: 47 (ref 25–125)
Bilirubin, Total: 0.4

## 2020-06-04 LAB — CBC AND DIFFERENTIAL
HCT: 34 — AB (ref 36–46)
Hemoglobin: 11.8 — AB (ref 12.0–16.0)
Neutrophils Absolute: 2.96
Platelets: 256 (ref 150–399)
WBC: 4.7

## 2020-06-04 LAB — BASIC METABOLIC PANEL
BUN: 12 (ref 4–21)
CO2: 30 — AB (ref 13–22)
Chloride: 99 (ref 99–108)
Creatinine: 0.7 (ref 0.5–1.1)
Glucose: 222
Potassium: 3.7 (ref 3.4–5.3)
Sodium: 138 (ref 137–147)

## 2020-06-04 LAB — COMPREHENSIVE METABOLIC PANEL
Albumin: 3.9 (ref 3.5–5.0)
Calcium: 9.5 (ref 8.7–10.7)

## 2020-06-04 LAB — CBC: RBC: 4.1 (ref 3.87–5.11)

## 2020-06-04 NOTE — Telephone Encounter (Signed)
Per 1/24 LOS, patient scheduled for 2/3 Labs, Follow Up w/Melissa

## 2020-06-13 NOTE — Progress Notes (Signed)
[place]  [address] [place],  [place]  [address] [phone]  Clinic Day:  06/14/2020  Referring physician: Denzil Magnuson *     CHIEF COMPLAINT:  CC:  Rectal cancer  Current Treatment:  Neoadjuvant chemoradiation with 5 FU infusion of the 1st and 5th week   HISTORY OF PRESENT ILLNESS:  Tanya Mcclure is a 60 y.o. female referred by Dr. Orrin Brigham for the evaluation and treatment of rectal cancer.  This began when the patient started to experience daily rectal bleeding with bright red blood and urgency.   She had also been having persistent diarrhea for the past two years which gradually worsened over that time.  She was referred to Dr. Melina Copa and underwent colonoscopy on December 10th.  Evaluation showed a rectal mass beginning at about 2 cm from the anal verge and was noted to obstruct 50% of the circumference of the rectum.  Benign polyps were also present and removed.  She had never undergone colonoscopy prior to this procedure.   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   CT abdomen and pelvis from December 14th revealed several small perirectal and presacral lymph nodes, highly concerning for metastatic involvement.  No other evidence of metastatic disease was observed.  She was seen by Dr. Lilia Pro in consultation to discuss surgical resection, and he has recommended neoadjuvant chemoradiation.  She continues to have significant rectal bleeding to the point where she requires a pad.  She did have evidence of ulcer on EGD and so she has discontinued ibuprofen, and she has been placed on pantoprazole 40 mg BID.  She has not had a mammogram within the past year and has never undergone a bone density scan.    INTERVAL HISTORY:  She is here for routine follow up after starting concurrent chemoradiation.  She started her 5FU infusion on January 10th.  She tolerated this without significant difficulty. She reports lower abdominal  cramping that wraps around her back. She has been using Tylenol without much relief. Her appetite is good and her weight is stable. She denies fever, chills, nausea or vomiting. Her rectal bleeding has stopped. She denies issue with bowel or bladder. She denies shortness of breath, cough or chest pain. CBC and CMP today are unremarkable.   REVIEW OF SYSTEMS:  Review of Systems  Constitutional: Negative for appetite change, chills, diaphoresis, fatigue, fever and unexpected weight change.  HENT:   Negative for hearing loss, lump/mass, mouth sores, nosebleeds, sore throat, tinnitus, trouble swallowing and voice change.   Eyes: Negative for eye problems and icterus.  Respiratory: Negative for chest tightness, cough, hemoptysis, shortness of breath and wheezing.   Cardiovascular: Negative for chest pain, leg swelling and palpitations.  Gastrointestinal: Positive for abdominal pain. Negative for abdominal distention, blood in stool, constipation, diarrhea, nausea, rectal pain and vomiting.  Endocrine: Negative for hot flashes.  Genitourinary: Negative for bladder incontinence, difficulty urinating, dyspareunia, dysuria, frequency, hematuria and nocturia.   Musculoskeletal: Positive for back pain. Negative for arthralgias, flank pain, gait problem, myalgias, neck pain and neck stiffness.  Skin: Negative for itching, rash and wound.  Neurological: Negative for dizziness, extremity weakness, gait problem, headaches, light-headedness, numbness, seizures and speech difficulty.  Hematological: Negative for adenopathy. Does not bruise/bleed easily.  Psychiatric/Behavioral: Negative for confusion, decreased concentration, depression, sleep disturbance and suicidal ideas. The patient is not nervous/anxious.      VITALS:  Blood pressure 128/68, pulse 68, temperature 98.2 F (36.8 C), temperature source Oral, resp.  rate 18, height $RemoveBe'5\' 3"'RDUdNykyD$  (1.6 m), weight 183 lb 3.2 oz (83.1 kg), SpO2 95 %.  Wt Readings from Last  3 Encounters:  06/14/20 183 lb 3.2 oz (83.1 kg)  06/04/20 183 lb (83 kg)  06/01/20 176 lb 8 oz (80.1 kg)    Body mass index is 32.45 kg/m.  Performance status (ECOG): 1 - Symptomatic but completely ambulatory  PHYSICAL EXAM:  Physical Exam Constitutional:      General: She is not in acute distress.    Appearance: Normal appearance. She is normal weight. She is not ill-appearing, toxic-appearing or diaphoretic.  HENT:     Head: Normocephalic and atraumatic.     Right Ear: Tympanic membrane normal.     Left Ear: Tympanic membrane normal.     Nose: Nose normal. No congestion or rhinorrhea.     Mouth/Throat:     Mouth: Mucous membranes are moist.     Pharynx: Oropharynx is clear. No oropharyngeal exudate or posterior oropharyngeal erythema.  Eyes:     General: No scleral icterus.       Right eye: No discharge.        Left eye: No discharge.     Extraocular Movements: Extraocular movements intact.     Conjunctiva/sclera: Conjunctivae normal.     Pupils: Pupils are equal, round, and reactive to light.  Neck:     Vascular: No carotid bruit.  Cardiovascular:     Rate and Rhythm: Normal rate and regular rhythm.     Heart sounds: No murmur heard. No friction rub. No gallop.   Pulmonary:     Effort: Pulmonary effort is normal. No respiratory distress.     Breath sounds: Normal breath sounds. No stridor. No wheezing, rhonchi or rales.  Chest:     Chest wall: No tenderness.  Abdominal:     General: Abdomen is flat. Bowel sounds are normal. There is no distension.     Palpations: There is no mass.     Tenderness: There is no abdominal tenderness. There is no right CVA tenderness, left CVA tenderness, guarding or rebound.     Hernia: No hernia is present.  Musculoskeletal:        General: No swelling, tenderness, deformity or signs of injury. Normal range of motion.     Cervical back: Normal range of motion and neck supple. No rigidity or tenderness.     Right lower leg: No edema.      Left lower leg: No edema.  Lymphadenopathy:     Cervical: No cervical adenopathy.  Skin:    General: Skin is warm and dry.     Capillary Refill: Capillary refill takes less than 2 seconds.     Coloration: Skin is not jaundiced or pale.     Findings: No bruising, erythema, lesion or rash.  Neurological:     General: No focal deficit present.     Mental Status: She is alert and oriented to person, place, and time. Mental status is at baseline.     Cranial Nerves: No cranial nerve deficit.     Sensory: No sensory deficit.     Motor: No weakness.     Coordination: Coordination normal.     Gait: Gait normal.     Deep Tendon Reflexes: Reflexes normal.  Psychiatric:        Mood and Affect: Mood normal.        Behavior: Behavior normal.        Thought Content: Thought content normal.  Judgment: Judgment normal.     LABS:   CBC Latest Ref Rng & Units 06/14/2020 06/04/2020 05/31/2020  WBC - 4.0 4.7 4.3  Hemoglobin 12.0 - 16.0 11.7(A) 11.8(A) 12.7  Hematocrit 36 - 46 34(A) 34(A) 37  Platelets 150 - 399 260 256 233   CMP Latest Ref Rng & Units 06/14/2020 06/04/2020 05/31/2020  Glucose - - - -  BUN 4 - $R'21 7 12 12  'zB$ Creatinine 0.5 - 1.1 0.6 0.7 0.7  Sodium 137 - 147 138 138 134(A)  Potassium 3.4 - 5.3 3.7 3.7 3.2(A)  Chloride 99 - 108 100 99 95(A)  CO2 13 - 22 32(A) 30(A) 30(A)  Calcium 8.7 - 10.7 9.6 9.5 9.7  Alkaline Phos 25 - 125 43 47 62  AST 13 - 35 $Re'20 22 24  'CGF$ ALT 7 - 35 $Re'15 18 15     'GOw$ Lab Results  Component Value Date   CEA1 56.9 (H) 05/10/2020   /  CEA  Date Value Ref Range Status  05/10/2020 56.9 (H) 0.0 - 4.7 ng/mL Final    Comment:    (NOTE)                             Nonsmokers          <3.9                             Smokers             <5.6 [employer] Electrochemiluminescence Immunoassay (ECLIA) Values obtained with different assay methods or kits cannot be used interchangeably.  Results cannot be interpreted as absolute evidence of the presence  or absence of malignant disease. Performed At: [facility] [place], [place] [id number] Rush Farmer MD GU:[id number]      STUDIES:   No current studies  HISTORY:     Allergies:  Allergies  Allergen Reactions  . Penicillins     Current Medications: Current Outpatient Medications  Medication Sig Dispense Refill  . dicyclomine (BENTYL) 10 MG capsule Take 1 capsule (10 mg total) by mouth 4 (four) times daily -  before meals and at bedtime. 90 capsule 0  . HYDROcodone-acetaminophen (NORCO) 7.5-325 MG tablet Take 1 tablet by mouth every 6 (six) hours as needed for moderate pain. 120 tablet 0  . Accu-Chek Softclix Lancets lancets USE TO CHECK BLOOD GLUCOSE TWICE DAILY.    Marland Kitchen acetaminophen (TYLENOL) 500 MG tablet Take by mouth.    Marland Kitchen albuterol (ACCUNEB) 0.63 MG/3ML nebulizer solution Inhale into the lungs.    Marland Kitchen albuterol (VENTOLIN HFA) 108 (90 Base) MCG/ACT inhaler Inhale into the lungs.    Marland Kitchen aspirin 81 MG EC tablet Take by mouth.    Marland Kitchen atorvastatin (LIPITOR) 40 MG tablet Take by mouth.    . budesonide-formoterol (SYMBICORT) 160-4.5 MCG/ACT inhaler Inhale into the lungs.    . ergocalciferol (VITAMIN D2) 1.25 MG (50000 UT) capsule Take by mouth.    . Exenatide ER 2 MG PEN Inject into the skin.    . furosemide (LASIX) 20 MG tablet Take 1 tablet by mouth daily.    Marland Kitchen gabapentin (NEURONTIN) 300 MG capsule TAKE 1 CAPSULE BY MOUTH TWICE DAILY AS NEEDED    . glucose blood (ACCU-CHEK AVIVA PLUS) test strip Check blood glucose twice daily and as needed. Dx E 11.65    . Lancets Misc. (ACCU-CHEK FASTCLIX LANCET) KIT Check  blood glucose twice daily. Dx E 11.65    . levothyroxine (SYNTHROID) 100 MCG tablet TAKE ONE TABLET BY MOUTH ONCE DAILY AT 6AM.    . LORazepam (ATIVAN) 0.5 MG tablet Take by mouth.    . meloxicam (MOBIC) 15 MG tablet Take 1 tablet (15 mg total) by mouth daily. 30 tablet 0  . metFORMIN (GLUCOPHAGE-XR) 500 MG 24 hr tablet Take 1 tablet by mouth 2 (two)  times daily with a meal.    . methylPREDNISolone (MEDROL DOSEPAK) 4 MG TBPK tablet Take 1st as instructed 21 tablet 0  . montelukast (SINGULAIR) 10 MG tablet Take by mouth.    . ondansetron (ZOFRAN) 4 MG tablet Take 1 tablet (4 mg total) by mouth every 4 (four) hours as needed for nausea. 90 tablet 3  . oxyCODONE (OXY IR/ROXICODONE) 5 MG immediate release tablet Take 5 mg by mouth every 4 (four) hours as needed.    . pantoprazole (PROTONIX) 40 MG tablet Take 40 mg by mouth 2 (two) times daily.    . prochlorperazine (COMPAZINE) 10 MG tablet Take 1 tablet (10 mg total) by mouth every 6 (six) hours as needed for nausea or vomiting. 90 tablet 3  . SUTAB [phone] MG TABS TAKE 24 TABLETS BY MOUTH AS DIRECTED.     Current Facility-Administered Medications  Medication Dose Route Frequency Provider Last Rate Last Admin  . triamcinolone acetonide (KENALOG) 10 MG/ML injection 10 mg  10 mg Other Once [place], [place], DPM      . triamcinolone acetonide (KENALOG-40) injection 20 mg  20 mg Other Once Landis Martins, DPM         ASSESSMENT & PLAN:   Assessment:   1.  Rectal cancer, diagnosed in December 2021.  She has several small perirectal and presacral lymph nodes, highly concerning for metastatic involvement, but no other evidence of metastatic disease was observed.  This is likely a stage III based on the adenopathy seen.  She started chemoradiation in early January.  Baseline CEA was 56.9.  2.  Benign polyps, removed at time of colonoscopy.  3.  Rectal bleeding and diarrhea, associated with malignancy, improved.  The rectal bleeding has subsided at this time.  4.  Persistent elevated total protein.  I will order an SPEP for further evaluation.   5.  Mild anemia, secondary to above. Remains stable at 11.4  6.  History of peptic ulcer disease.  Plan: Yareth has started concurrent chemoradiation and received her 1st 5FU infusion on January 10th.   She states that she tolerated treatment  without significant difficulty, and she has had improvement in her diarrhea and appetite. I will send in Bentyl and hydrocodone for her pain. We will proceed with her second cycle of 06-18-2020. She will return to clinic next week for repeat CBC, CMP and evaluation prior to another cycle of 5 FU infusion.  I provided 20 minutes of face-to-face time during this this encounter and > 50% was spent counseling as documented under my assessment and plan.    [name], NP [facility] AT [facility] [address] [place] [address] Dept: [phone] Dept Fax: [phone]

## 2020-06-14 ENCOUNTER — Inpatient Hospital Stay: Payer: Medicaid Other | Attending: Oncology

## 2020-06-14 ENCOUNTER — Other Ambulatory Visit: Payer: Self-pay | Admitting: Oncology

## 2020-06-14 ENCOUNTER — Encounter: Payer: Self-pay | Admitting: Hematology and Oncology

## 2020-06-14 ENCOUNTER — Inpatient Hospital Stay (INDEPENDENT_AMBULATORY_CARE_PROVIDER_SITE_OTHER): Payer: Medicaid Other | Admitting: Hematology and Oncology

## 2020-06-14 ENCOUNTER — Other Ambulatory Visit: Payer: Self-pay

## 2020-06-14 VITALS — BP 128/68 | HR 68 | Temp 98.2°F | Resp 18 | Ht 63.0 in | Wt 183.2 lb

## 2020-06-14 DIAGNOSIS — C20 Malignant neoplasm of rectum: Secondary | ICD-10-CM | POA: Insufficient documentation

## 2020-06-14 DIAGNOSIS — D649 Anemia, unspecified: Secondary | ICD-10-CM | POA: Insufficient documentation

## 2020-06-14 DIAGNOSIS — Z923 Personal history of irradiation: Secondary | ICD-10-CM | POA: Insufficient documentation

## 2020-06-14 DIAGNOSIS — Z5111 Encounter for antineoplastic chemotherapy: Secondary | ICD-10-CM | POA: Insufficient documentation

## 2020-06-14 DIAGNOSIS — Z8711 Personal history of peptic ulcer disease: Secondary | ICD-10-CM | POA: Insufficient documentation

## 2020-06-14 DIAGNOSIS — E876 Hypokalemia: Secondary | ICD-10-CM | POA: Insufficient documentation

## 2020-06-14 LAB — HEPATIC FUNCTION PANEL
ALT: 15 (ref 7–35)
AST: 20 (ref 13–35)
Alkaline Phosphatase: 43 (ref 25–125)
Bilirubin, Total: 0.5

## 2020-06-14 LAB — CBC AND DIFFERENTIAL
HCT: 34 — AB (ref 36–46)
Hemoglobin: 11.7 — AB (ref 12.0–16.0)
Neutrophils Absolute: 2.36
Platelets: 260 (ref 150–399)
WBC: 4

## 2020-06-14 LAB — BASIC METABOLIC PANEL
BUN: 7 (ref 4–21)
CO2: 32 — AB (ref 13–22)
Chloride: 100 (ref 99–108)
Creatinine: 0.6 (ref 0.5–1.1)
Glucose: 153
Potassium: 3.7 (ref 3.4–5.3)
Sodium: 138 (ref 137–147)

## 2020-06-14 LAB — CBC: RBC: 4.09 (ref 3.87–5.11)

## 2020-06-14 LAB — COMPREHENSIVE METABOLIC PANEL
Albumin: 3.9 (ref 3.5–5.0)
Calcium: 9.6 (ref 8.7–10.7)

## 2020-06-14 MED ORDER — DICYCLOMINE HCL 10 MG PO CAPS: 10.0000 mg | ORAL_CAPSULE | Freq: Three times a day (TID) | ORAL | 0 refills | Status: DC

## 2020-06-14 MED ORDER — HYDROCODONE-ACETAMINOPHEN 7.5-325 MG PO TABS: 1.0000 | ORAL_TABLET | Freq: Four times a day (QID) | ORAL | 0 refills | Status: DC | PRN

## 2020-06-18 ENCOUNTER — Other Ambulatory Visit: Payer: Self-pay

## 2020-06-18 ENCOUNTER — Inpatient Hospital Stay: Payer: Medicaid Other

## 2020-06-18 DIAGNOSIS — Z5111 Encounter for antineoplastic chemotherapy: Secondary | ICD-10-CM | POA: Diagnosis not present

## 2020-06-18 DIAGNOSIS — C20 Malignant neoplasm of rectum: Secondary | ICD-10-CM

## 2020-06-18 DIAGNOSIS — D649 Anemia, unspecified: Secondary | ICD-10-CM | POA: Diagnosis not present

## 2020-06-18 DIAGNOSIS — Z8711 Personal history of peptic ulcer disease: Secondary | ICD-10-CM | POA: Diagnosis not present

## 2020-06-18 DIAGNOSIS — E876 Hypokalemia: Secondary | ICD-10-CM | POA: Diagnosis not present

## 2020-06-18 DIAGNOSIS — Z923 Personal history of irradiation: Secondary | ICD-10-CM | POA: Diagnosis not present

## 2020-06-18 MED ORDER — SODIUM CHLORIDE 0.9% FLUSH
10.0000 mL | INTRAVENOUS | Status: DC | PRN
  Administered 2020-06-18: 10 mL
  Filled 2020-06-18: qty 10

## 2020-06-18 MED ORDER — SODIUM CHLORIDE 0.9 % IV SOLN
350.0000 mg/m2/d | INTRAVENOUS | Status: DC
  Administered 2020-06-18: 2750 mg via INTRAVENOUS
  Filled 2020-06-18: qty 55

## 2020-06-18 NOTE — Patient Instructions (Signed)
Fluorouracil, 5-FU injection What is this medicine? FLUOROURACIL, 5-FU (flure oh YOOR a sil) is a chemotherapy drug. It slows the growth of cancer cells. This medicine is used to treat many types of cancer like breast cancer, colon or rectal cancer, pancreatic cancer, and stomach cancer. This medicine may be used for other purposes; ask your health care provider or pharmacist if you have questions. COMMON BRAND NAME(S): Adrucil What should I tell my health care provider before I take this medicine? They need to know if you have any of these conditions:  blood disorders  dihydropyrimidine dehydrogenase (DPD) deficiency  infection (especially a virus infection such as chickenpox, cold sores, or herpes)  kidney disease  liver disease  malnourished, poor nutrition  recent or ongoing radiation therapy  an unusual or allergic reaction to fluorouracil, other chemotherapy, other medicines, foods, dyes, or preservatives  pregnant or trying to get pregnant  breast-feeding How should I use this medicine? This drug is given as an infusion or injection into a vein. It is administered in a hospital or clinic by a specially trained health care professional. Talk to your pediatrician regarding the use of this medicine in children. Special care may be needed. Overdosage: If you think you have taken too much of this medicine contact a poison control center or emergency room at once. NOTE: This medicine is only for you. Do not share this medicine with others. What if I miss a dose? It is important not to miss your dose. Call your doctor or health care professional if you are unable to keep an appointment. What may interact with this medicine? Do not take this medicine with any of the following medications:  live virus vaccines This medicine may also interact with the following medications:  medicines that treat or prevent blood clots like warfarin, enoxaparin, and dalteparin This list may not  describe all possible interactions. Give your health care provider a list of all the medicines, herbs, non-prescription drugs, or dietary supplements you use. Also tell them if you smoke, drink alcohol, or use illegal drugs. Some items may interact with your medicine. What should I watch for while using this medicine? Visit your doctor for checks on your progress. This drug may make you feel generally unwell. This is not uncommon, as chemotherapy can affect healthy cells as well as cancer cells. Report any side effects. Continue your course of treatment even though you feel ill unless your doctor tells you to stop. In some cases, you may be given additional medicines to help with side effects. Follow all directions for their use. Call your doctor or health care professional for advice if you get a fever, chills or sore throat, or other symptoms of a cold or flu. Do not treat yourself. This drug decreases your body's ability to fight infections. Try to avoid being around people who are sick. This medicine may increase your risk to bruise or bleed. Call your doctor or health care professional if you notice any unusual bleeding. Be careful brushing and flossing your teeth or using a toothpick because you may get an infection or bleed more easily. If you have any dental work done, tell your dentist you are receiving this medicine. Avoid taking products that contain aspirin, acetaminophen, ibuprofen, naproxen, or ketoprofen unless instructed by your doctor. These medicines may hide a fever. Do not become pregnant while taking this medicine. Women should inform their doctor if they wish to become pregnant or think they might be pregnant. There is a potential   for serious side effects to an unborn child. Talk to your health care professional or pharmacist for more information. Do not breast-feed an infant while taking this medicine. Men should inform their doctor if they wish to father a child. This medicine may  lower sperm counts. Do not treat diarrhea with over the counter products. Contact your doctor if you have diarrhea that lasts more than 2 days or if it is severe and watery. This medicine can make you more sensitive to the sun. Keep out of the sun. If you cannot avoid being in the sun, wear protective clothing and use sunscreen. Do not use sun lamps or tanning beds/booths. What side effects may I notice from receiving this medicine? Side effects that you should report to your doctor or health care professional as soon as possible:  allergic reactions like skin rash, itching or hives, swelling of the face, lips, or tongue  low blood counts - this medicine may decrease the number of white blood cells, red blood cells and platelets. You may be at increased risk for infections and bleeding.  signs of infection - fever or chills, cough, sore throat, pain or difficulty passing urine  signs of decreased platelets or bleeding - bruising, pinpoint red spots on the skin, black, tarry stools, blood in the urine  signs of decreased red blood cells - unusually weak or tired, fainting spells, lightheadedness  breathing problems  changes in vision  chest pain  mouth sores  nausea and vomiting  pain, swelling, redness at site where injected  pain, tingling, numbness in the hands or feet  redness, swelling, or sores on hands or feet  stomach pain  unusual bleeding Side effects that usually do not require medical attention (report to your doctor or health care professional if they continue or are bothersome):  changes in finger or toe nails  diarrhea  dry or itchy skin  hair loss  headache  loss of appetite  sensitivity of eyes to the light  stomach upset  unusually teary eyes This list may not describe all possible side effects. Call your doctor for medical advice about side effects. You may report side effects to FDA at 1-800-FDA-1088. Where should I keep my medicine? This  drug is given in a hospital or clinic and will not be stored at home. NOTE: This sheet is a summary. It may not cover all possible information. If you have questions about this medicine, talk to your doctor, pharmacist, or health care provider.  2021 Elsevier/Gold Standard (2019-03-29 15:00:03)

## 2020-06-22 ENCOUNTER — Other Ambulatory Visit: Payer: Self-pay

## 2020-06-22 ENCOUNTER — Inpatient Hospital Stay: Payer: Medicaid Other

## 2020-06-22 VITALS — BP 139/60 | HR 78 | Temp 99.1°F | Resp 16 | Ht 63.0 in | Wt 174.8 lb

## 2020-06-22 DIAGNOSIS — Z5111 Encounter for antineoplastic chemotherapy: Secondary | ICD-10-CM | POA: Diagnosis not present

## 2020-06-22 DIAGNOSIS — C20 Malignant neoplasm of rectum: Secondary | ICD-10-CM

## 2020-06-22 MED ORDER — SODIUM CHLORIDE 0.9% FLUSH
10.0000 mL | INTRAVENOUS | Status: DC | PRN
  Administered 2020-06-22: 10 mL
  Filled 2020-06-22: qty 10

## 2020-06-22 MED ORDER — HEPARIN SOD (PORK) LOCK FLUSH 100 UNIT/ML IV SOLN
500.0000 [IU] | Freq: Once | INTRAVENOUS | Status: AC | PRN
  Administered 2020-06-22: 500 [IU]
  Filled 2020-06-22: qty 5

## 2020-06-22 NOTE — Patient Instructions (Signed)
[facility] Discharge Instructions for Patients Receiving Chemotherapy  Today you received the following chemotherapy agents Fluorouracil  To help prevent nausea and vomiting after your treatment, we encourage you to take your nausea medication as directed   If you develop nausea and vomiting that is not controlled by your nausea medication, call the clinic.   BELOW ARE SYMPTOMS THAT SHOULD BE REPORTED IMMEDIATELY:  *FEVER GREATER THAN 100.5 F  *CHILLS WITH OR WITHOUT FEVER  NAUSEA AND VOMITING THAT IS NOT CONTROLLED WITH YOUR NAUSEA MEDICATION  *UNUSUAL SHORTNESS OF BREATH  *UNUSUAL BRUISING OR BLEEDING  TENDERNESS IN MOUTH AND THROAT WITH OR WITHOUT PRESENCE OF ULCERS  *URINARY PROBLEMS  *BOWEL PROBLEMS  UNUSUAL RASH Items with * indicate a potential emergency and should be followed up as soon as possible.  Feel free to call the clinic should you have any questions or concerns at The clinic phone number is [phone].  Please show the [facility] at check-in to the Emergency Department and triage nurse.

## 2020-06-22 NOTE — Progress Notes (Signed)
1413:PT STABLE AT TIME OF DISCHARGE ?

## 2020-06-26 ENCOUNTER — Other Ambulatory Visit: Payer: Self-pay | Admitting: Hematology and Oncology

## 2020-06-26 ENCOUNTER — Inpatient Hospital Stay: Payer: Medicaid Other

## 2020-06-26 DIAGNOSIS — C20 Malignant neoplasm of rectum: Secondary | ICD-10-CM

## 2020-06-26 MED ORDER — DIPHENOXYLATE-ATROPINE 2.5-0.025 MG PO TABS: 2.0000 | ORAL_TABLET | Freq: Four times a day (QID) | ORAL | 0 refills | Status: DC | PRN

## 2020-06-27 ENCOUNTER — Other Ambulatory Visit: Payer: Self-pay

## 2020-06-27 ENCOUNTER — Inpatient Hospital Stay: Payer: Medicaid Other

## 2020-06-27 ENCOUNTER — Other Ambulatory Visit: Payer: Self-pay | Admitting: Hematology and Oncology

## 2020-06-27 VITALS — BP 123/65 | HR 68 | Temp 98.7°F | Resp 18 | Ht 63.0 in | Wt 173.8 lb

## 2020-06-27 DIAGNOSIS — Z5111 Encounter for antineoplastic chemotherapy: Secondary | ICD-10-CM | POA: Diagnosis not present

## 2020-06-27 DIAGNOSIS — C20 Malignant neoplasm of rectum: Secondary | ICD-10-CM

## 2020-06-27 MED ORDER — POTASSIUM CHLORIDE 10 MEQ/100ML IV SOLN
INTRAVENOUS | Status: AC
  Filled 2020-06-27: qty 200

## 2020-06-27 MED ORDER — POTASSIUM CHLORIDE 10 MEQ/100ML IV SOLN
10.0000 meq | INTRAVENOUS | Status: AC
  Administered 2020-06-27 (×2): 10 meq via INTRAVENOUS

## 2020-06-27 MED ORDER — HEPARIN SOD (PORK) LOCK FLUSH 100 UNIT/ML IV SOLN
500.0000 [IU] | Freq: Once | INTRAVENOUS | Status: AC | PRN
  Administered 2020-06-27: 500 [IU]
  Filled 2020-06-27: qty 5

## 2020-06-27 MED ORDER — SODIUM CHLORIDE 0.9% FLUSH
10.0000 mL | Freq: Once | INTRAVENOUS | Status: AC | PRN
  Administered 2020-06-27: 10 mL
  Filled 2020-06-27: qty 10

## 2020-06-27 NOTE — Patient Instructions (Signed)
Potassium chloride injection What is this medicine? POTASSIUM CHLORIDE (poe TASS i um KLOOR ide) is a potassium supplement used to prevent and to treat low potassium. Potassium is important for the heart, muscles, and nerves. Too much or too little potassium in the body can cause serious problems. This medicine may be used for other purposes; ask your health care provider or pharmacist if you have questions. COMMON BRAND NAME(S): PROAMP What should I tell my health care provider before I take this medicine? They need to know if you have any of these conditions:  Addison disease  dehydration  diabetes (high blood sugar)  heart disease  high levels of potassium in the blood  irregular heartbeat or rhythm  kidney disease  large areas of burned skin  an unusual or allergic reaction to potassium, other medicines, foods, dyes, or preservatives  pregnant or trying to get pregnant  breast-feeding How should I use this medicine? This medicine is injected into a vein. It is given by a health care provider in a hospital or clinic setting. Talk to your health care provider about the use of this medicine in children. Special care may be needed. Overdosage: If you think you have taken too much of this medicine contact a poison control center or emergency room at once. NOTE: This medicine is only for you. Do not share this medicine with others. What if I miss a dose? This does not apply. This medicine is not for regular use. What may interact with this medicine? Do not take this medicine with any of the following medications:  certain diuretics such as spironolactone, triamterene  eplerenone  sodium polystyrene sulfonate This medicine may also interact with the following medications:  certain medicines for blood pressure or heart disease like lisinopril, losartan, quinapril, valsartan  medicines that lower your chance of fighting infection such as cyclosporine, tacrolimus  NSAIDs,  medicines for pain and inflammation, like ibuprofen or naproxen  other potassium supplements  salt substitutes This list may not describe all possible interactions. Give your health care provider a list of all the medicines, herbs, non-prescription drugs, or dietary supplements you use. Also tell them if you smoke, drink alcohol, or use illegal drugs. Some items may interact with your medicine. What should I watch for while using this medicine? Visit your health care provider for regular checks on your progress. Tell your health care provider if your symptoms do not start to get better or if they get worse. You may need blood work while you are taking this medicine. Avoid salt substitutes unless you are told otherwise by your health care provider. What side effects may I notice from receiving this medicine? Side effects that you should report to your doctor or health care professional as soon as possible:  allergic reactions (skin rash, itching, hives, swelling of the face, lips, tongue, or throat)  confusion  high potassium levels (muscle weakness, fast or irregular heartbeat)  low blood pressure (dizziness, feeling faint or lightheaded, blurry vision)  pain, tingling, or numbness in lips, hands, or feet  pain, redness, or irritation at site where injected  trouble breathing Side effects that usually do not require medical attention (report to your doctor or health care professional if they continue or are bothersome):  diarrhea  nausea, vomiting  passing gas  stomach pain This list may not describe all possible side effects. Call your doctor for medical advice about side effects. You may report side effects to FDA at 1-800-FDA-1088. Where should I keep my   medicine? This medicine is given in a hospital or clinic. It will not be stored at home. NOTE: This sheet is a summary. It may not cover all possible information. If you have questions about this medicine, talk to your  doctor, pharmacist, or health care provider.  2021 Elsevier/Gold Standard (2019-02-24 18:18:09)  

## 2020-06-28 NOTE — Progress Notes (Signed)
[facility]  [address] [place],  [place]  [address] [phone]  Clinic Day:  06/29/2020  Referring physician: Denzil Magnuson *   This document serves as a record of services personally performed by Hosie Poisson, MD. It was created on their behalf by Curry,Lauren E, a trained medical scribe. The creation of this record is based on the scribe's personal observations and the provider's statements to them.  CHIEF COMPLAINT:  CC:  Rectal cancer  Current Treatment:  Neoadjuvant chemoradiation with 5 FU infusion of the 1st and 5th week   HISTORY OF PRESENT ILLNESS:  Tanya Mcclure is a 60 y.o. female with rectal cancer, diagnosed in December 2021.  This began when the patient started to experience daily rectal bleeding with bright red blood and urgency.   She had also been having persistent diarrhea for the past two years which gradually worsened over that time.  She was referred to Dr. Melina Copa and underwent colonoscopy on December 10th.  Evaluation showed a rectal mass beginning at about 2 cm from the anal verge and was noted to obstruct 50% of the circumference of the rectum.  Benign polyps were also present and removed.  She had never undergone colonoscopy prior to this procedure.   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   CT abdomen and pelvis from December 14th revealed several small perirectal and presacral lymph nodes, highly concerning for metastatic involvement.  No other evidence of metastatic disease was observed.  She was seen by Dr. Lilia Pro in consultation to discuss surgical resection, and he has recommended neoadjuvant chemoradiation.  She continued to have significant rectal bleeding to the point where she requires a pad.  She did have evidence of ulcer on EGD and so she has discontinued ibuprofen, and she has been placed on pantoprazole 40 mg BID.  She has not had a mammogram within the past year and has never undergone a  bone density scan.  She started concurrent chemoradiation and received her 1st 5FU infusion on January 10th.     INTERVAL HISTORY:  She is here for routine follow up after completing a 50.4 Gy course for a total of 27 fractions of radiation therapy on February 16th.  She states that her bowel movements have improved with Lomotil.  Otherwise, she has been doing well.  Blood counts and chemistries are unremarkable except for a potassium of 3.0.  She has already received IV potassium on Wednesday, when her level was 2.9.  We will administer 20 meq of IV potassium today, and place her on oral supplement 20 BID.  Her  appetite is good, and she has lost nearly 8 pounds since her last visit.  She denies fever, chills or other signs of infection.  She denies nausea, vomiting, bowel issues, or abdominal pain.  She denies sore throat, cough, dyspnea, or chest pain.  REVIEW OF SYSTEMS:  Review of Systems  Constitutional: Negative.  Negative for appetite change, chills, fatigue, fever and unexpected weight change.  HENT:  Negative.   Eyes: Negative.   Respiratory: Negative.  Negative for chest tightness, cough, hemoptysis, shortness of breath and wheezing.   Cardiovascular: Negative.  Negative for chest pain, leg swelling and palpitations.  Gastrointestinal: Positive for diarrhea (improved with Lomotil). Negative for abdominal distention, abdominal pain, blood in stool, constipation, nausea, rectal pain and vomiting.  Endocrine: Negative.   Genitourinary: Negative.    Musculoskeletal: Negative.  Negative for arthralgias and gait problem.  Skin: Negative.   Neurological:  Negative.  Negative for dizziness, extremity weakness, gait problem, headaches, light-headedness, numbness, seizures and speech difficulty.  Hematological: Negative.   Psychiatric/Behavioral: Negative.  Negative for depression and sleep disturbance. The patient is not nervous/anxious.      VITALS:  Blood pressure 130/63, pulse 69,  temperature 98.6 F (37 C), temperature source Oral, resp. rate 18, height _0  (1.6 m), weight 175 lb 9.6 oz (79.7 kg), SpO2 96 %.  Wt Readings from Last 3 Encounters:  06/29/20 175 lb 9.6 oz (79.7 kg)  06/27/20 173 lb 12 oz (78.8 kg)  06/22/20 174 lb 12 oz (79.3 kg)    Body mass index is 31.11 kg/m.  Performance status (ECOG): 1 - Symptomatic but completely ambulatory  PHYSICAL EXAM:  Physical Exam Constitutional:      General: She is not in acute distress.    Appearance: Normal appearance. She is normal weight.  HENT:     Head: Normocephalic and atraumatic.  Eyes:     General: No scleral icterus.    Extraocular Movements: Extraocular movements intact.     Conjunctiva/sclera: Conjunctivae normal.     Pupils: Pupils are equal, round, and reactive to light.  Cardiovascular:     Rate and Rhythm: Normal rate and regular rhythm.     Pulses: Normal pulses.     Heart sounds: Normal heart sounds. No murmur heard. No friction rub. No gallop.   Pulmonary:     Effort: Pulmonary effort is normal. No respiratory distress.     Breath sounds: Normal breath sounds.  Abdominal:     General: Bowel sounds are normal. There is no distension.     Palpations: Abdomen is soft. There is no hepatomegaly, splenomegaly or mass.     Tenderness: There is abdominal tenderness (right upper quadrant and epigastrium).  Musculoskeletal:        General: Normal range of motion.     Cervical back: Normal range of motion and neck supple.     Right lower leg: No edema.     Left lower leg: No edema.  Lymphadenopathy:     Cervical: No cervical adenopathy.  Skin:    General: Skin is warm and dry.  Neurological:     General: No focal deficit present.     Mental Status: She is alert and oriented to person, place, and time. Mental status is at baseline.  Psychiatric:        Mood and Affect: Mood normal.        Behavior: Behavior normal.        Thought Content: Thought content normal.        Judgment:  Judgment normal.     LABS:   CBC Latest Ref Rng & Units 06/29/2020 06/14/2020 06/04/2020  WBC - 3.9 4.0 4.7  Hemoglobin 12.0 - 16.0 12.2 11.7(A) 11.8(A)  Hematocrit 36 - 46 36 34(A) 34(A)  Platelets 150 - 399 252 260 256   CMP Latest Ref Rng & Units 06/29/2020 06/14/2020 06/04/2020  Glucose - - - -  BUN 4 - _1 Creatinine 0.5 - 1.1 0.6 0.6 0.7  Sodium 137 - 147 137 138 138  Potassium 3.4 - 5.3 3.0(A) 3.7 3.7  Chloride 99 - 108 99 100 99  CO2 13 - 22 30(A) 32(A) 30(A)  Calcium 8.7 - 10.7 9.1 9.6 9.5  Alkaline Phos 25 - 125 48 43 47  AST 13 - 35 36(A) 20 22  ALT 7 - 35 19 15 18  Lab Results  Component Value Date   CEA1 56.9 (H) 05/10/2020   /  CEA  Date Value Ref Range Status  05/10/2020 56.9 (H) 0.0 - 4.7 ng/mL Final    Comment:    (NOTE)                             Nonsmokers          <3.9                             Smokers             <5.6 [employer] Electrochemiluminescence Immunoassay (ECLIA) Values obtained with different assay methods or kits cannot be used interchangeably.  Results cannot be interpreted as absolute evidence of the presence or absence of malignant disease. Performed At: [facility] [place], [place] [id number] Rush Farmer MD HY:[id number]      STUDIES:   No current studies  HISTORY:     Allergies:  Allergies  Allergen Reactions  . Penicillins Rash    Current Medications: Current Outpatient Medications  Medication Sig Dispense Refill  . Accu-Chek Softclix Lancets lancets USE TO CHECK BLOOD GLUCOSE TWICE DAILY.    Marland Kitchen acetaminophen (TYLENOL) 500 MG tablet Take by mouth.    Marland Kitchen albuterol (ACCUNEB) 0.63 MG/3ML nebulizer solution Inhale into the lungs.    Marland Kitchen albuterol (VENTOLIN HFA) 108 (90 Base) MCG/ACT inhaler Inhale into the lungs.    Marland Kitchen aspirin 81 MG EC tablet Take by mouth.    Marland Kitchen atorvastatin (LIPITOR) 40 MG tablet Take by mouth.    . budesonide-formoterol (SYMBICORT) 160-4.5 MCG/ACT inhaler  Inhale into the lungs.    . dicyclomine (BENTYL) 10 MG capsule Take 1 capsule (10 mg total) by mouth 4 (four) times daily -  before meals and at bedtime. 90 capsule 0  . diphenoxylate-atropine (LOMOTIL) 2.5-0.025 MG tablet Take 2 tablets by mouth 4 (four) times daily as needed for diarrhea or loose stools. 30 tablet 0  . ergocalciferol (VITAMIN D2) 1.25 MG (50000 UT) capsule Take by mouth.    . Exenatide ER 2 MG PEN Inject into the skin.    . furosemide (LASIX) 20 MG tablet Take 1 tablet by mouth daily.    Marland Kitchen gabapentin (NEURONTIN) 300 MG capsule TAKE 1 CAPSULE BY MOUTH TWICE DAILY AS NEEDED    . glucose blood (ACCU-CHEK AVIVA PLUS) test strip Check blood glucose twice daily and as needed. Dx E 11.65    . HYDROcodone-acetaminophen (NORCO) 7.5-325 MG tablet Take 1 tablet by mouth every 6 (six) hours as needed for moderate pain. 120 tablet 0  . Lancets Misc. (ACCU-CHEK FASTCLIX LANCET) KIT Check blood glucose twice daily. Dx E 11.65    . levothyroxine (SYNTHROID) 100 MCG tablet TAKE ONE TABLET BY MOUTH ONCE DAILY AT 6AM.    . LORazepam (ATIVAN) 0.5 MG tablet Take by mouth.    . meloxicam (MOBIC) 15 MG tablet Take 1 tablet (15 mg total) by mouth daily. 30 tablet 0  . metFORMIN (GLUCOPHAGE-XR) 500 MG 24 hr tablet Take 1 tablet by mouth 2 (two) times daily with a meal.    . methylPREDNISolone (MEDROL DOSEPAK) 4 MG TBPK tablet Take 1st as instructed 21 tablet 0  . montelukast (SINGULAIR) 10 MG tablet Take by mouth.    . ondansetron (ZOFRAN) 4 MG tablet Take 1 tablet (4 mg total) by mouth every  4 (four) hours as needed for nausea. 90 tablet 3  . oxyCODONE (OXY IR/ROXICODONE) 5 MG immediate release tablet Take 5 mg by mouth every 4 (four) hours as needed.    . pantoprazole (PROTONIX) 40 MG tablet Take 40 mg by mouth 2 (two) times daily.    . prochlorperazine (COMPAZINE) 10 MG tablet Take 1 tablet (10 mg total) by mouth every 6 (six) hours as needed for nausea or vomiting. 90 tablet 3  . SUTAB [phone]  MG TABS TAKE 24 TABLETS BY MOUTH AS DIRECTED.     Current Facility-Administered Medications  Medication Dose Route Frequency Provider Last Rate Last Admin  . triamcinolone acetonide (KENALOG) 10 MG/ML injection 10 mg  10 mg Other Once [place], [place], DPM      . triamcinolone acetonide (KENALOG-40) injection 20 mg  20 mg Other Once Landis Martins, DPM         ASSESSMENT & PLAN:   Assessment:   1.  Rectal cancer, diagnosed in December 2021.  She has several small perirectal and presacral lymph nodes, highly concerning for metastatic involvement, but no other evidence of metastatic disease was observed.  This is likely a stage III based on the adenopathy seen.  Baseline CEA was 56.9.  She started chemoradiation in early January and completed treatment on February 16th.  As she has completed neoadjuvant therapy, we will refer her for surgical resection.  2.  Benign polyps, removed at time of colonoscopy.  3.  History of peptic ulcer disease.  4.  Persistent severe hypokalemia.  We will administer 20 meq IV today and place her on oral supplement 20 meq BID.  We will recheck this next week.  Plan: She completed radiation therapy on February 16th and is doing well.  Since she has completed neoadjuvant treatment, we will refer her for surgical resection.  I am not sure if they want further imaging prior to that.  Later we will plan for FOLFOX chemotherapy following her procedure.  Due to her persistent severe hypokalemia, we will administer 20 meq of IV potassium today, and place her on oral supplement 20 meq BID.  In 1 week, we will recheck CBC and CMP for continued supportive care.  We will plan to see her back in 2 weeks with CBC, CMP and CEA for repeat evaluation.  She understands and agrees with this plan of care.  I provided 20 minutes of face-to-face time during this this encounter and > 50% was spent counseling as documented under my assessment and plan.    Derwood Kaplan, MD [facility] AT [facility] [address] [place] [address] Dept: [phone] Dept Fax: [phone]   I, Rita Ohara, am acting as scribe for Derwood Kaplan, MD  I have reviewed this report as typed by the medical scribe, and it is complete and accurate.

## 2020-06-29 ENCOUNTER — Other Ambulatory Visit: Payer: Self-pay | Admitting: Oncology

## 2020-06-29 ENCOUNTER — Encounter: Payer: Self-pay | Admitting: Oncology

## 2020-06-29 ENCOUNTER — Inpatient Hospital Stay: Payer: Medicaid Other

## 2020-06-29 ENCOUNTER — Telehealth: Payer: Self-pay | Admitting: Oncology

## 2020-06-29 ENCOUNTER — Other Ambulatory Visit: Payer: Self-pay

## 2020-06-29 ENCOUNTER — Other Ambulatory Visit: Payer: Self-pay | Admitting: Hematology and Oncology

## 2020-06-29 ENCOUNTER — Inpatient Hospital Stay (INDEPENDENT_AMBULATORY_CARE_PROVIDER_SITE_OTHER): Payer: Medicaid Other | Admitting: Oncology

## 2020-06-29 VITALS — BP 130/63 | HR 69 | Temp 98.6°F | Resp 18 | Ht 63.0 in | Wt 175.6 lb

## 2020-06-29 VITALS — BP 128/57 | HR 69 | Temp 99.5°F | Resp 18 | Ht 63.0 in | Wt 175.8 lb

## 2020-06-29 DIAGNOSIS — E876 Hypokalemia: Secondary | ICD-10-CM

## 2020-06-29 DIAGNOSIS — C20 Malignant neoplasm of rectum: Secondary | ICD-10-CM

## 2020-06-29 DIAGNOSIS — Z5111 Encounter for antineoplastic chemotherapy: Secondary | ICD-10-CM | POA: Diagnosis not present

## 2020-06-29 LAB — BASIC METABOLIC PANEL
BUN: 6 (ref 4–21)
CO2: 30 — AB (ref 13–22)
Chloride: 99 (ref 99–108)
Creatinine: 0.6 (ref 0.5–1.1)
Glucose: 148
Potassium: 3 — AB (ref 3.4–5.3)
Sodium: 137 (ref 137–147)

## 2020-06-29 LAB — MAGNESIUM: Magnesium: 1.8

## 2020-06-29 LAB — HEPATIC FUNCTION PANEL
ALT: 19 (ref 7–35)
AST: 36 — AB (ref 13–35)
Alkaline Phosphatase: 48 (ref 25–125)
Bilirubin, Total: 0.6

## 2020-06-29 LAB — CBC AND DIFFERENTIAL
HCT: 36 (ref 36–46)
Hemoglobin: 12.2 (ref 12.0–16.0)
Neutrophils Absolute: 2.69
Platelets: 252 (ref 150–399)
WBC: 3.9

## 2020-06-29 LAB — COMPREHENSIVE METABOLIC PANEL
Albumin: 4.2 (ref 3.5–5.0)
Calcium: 9.1 (ref 8.7–10.7)

## 2020-06-29 LAB — CBC: RBC: 4.27 (ref 3.87–5.11)

## 2020-06-29 MED ORDER — POTASSIUM CHLORIDE 10 MEQ/100ML IV SOLN
10.0000 meq | INTRAVENOUS | Status: AC
  Administered 2020-06-29 (×2): 10 meq via INTRAVENOUS

## 2020-06-29 MED ORDER — POTASSIUM CHLORIDE CRYS ER 20 MEQ PO TBCR: 20.0000 meq | EXTENDED_RELEASE_TABLET | Freq: Two times a day (BID) | ORAL | 3 refills | Status: DC

## 2020-06-29 MED ORDER — POTASSIUM CHLORIDE 10 MEQ/100ML IV SOLN
INTRAVENOUS | Status: AC
  Filled 2020-06-29: qty 200

## 2020-06-29 MED ORDER — HEPARIN SOD (PORK) LOCK FLUSH 100 UNIT/ML IV SOLN
500.0000 [IU] | Freq: Once | INTRAVENOUS | Status: AC | PRN
  Administered 2020-06-29: 500 [IU]
  Filled 2020-06-29: qty 5

## 2020-06-29 NOTE — Progress Notes (Signed)
1559: PT STABLE AT TIME OF DISCHARGE

## 2020-06-29 NOTE — Telephone Encounter (Signed)
Per 2/18 LOS, patient scheduled for 2/23 Labs, 3/4 Labs, Follow Up. Gave patient Appt Summary

## 2020-06-29 NOTE — Addendum Note (Signed)
Addended by: Juanetta Beets on: 06/29/2020 11:54 AM   Modules accepted: Orders

## 2020-06-29 NOTE — Patient Instructions (Signed)

## 2020-07-03 ENCOUNTER — Ambulatory Visit: Payer: Self-pay | Admitting: General Surgery

## 2020-07-03 NOTE — H&P (Signed)
The patient is a 60 year old female who presents with colorectal cancer. 60 year old female who presents to the office for evaluation of a rectal cancer diagnosed in December 2021 due to rectal bleeding and diarrhea. She was noted to have a distal rectal mass. Biopsy showed adenocarcinoma. CT scan show some perirectal enlarged lymph nodes, but no signs of metastatic disease. She underwent ports placement and started chemotherapy and radiation. This was completed on February 16. She presents to the office today for evaluation for surgery. Past surgical history is significant for open cholecystectomy and laparoscopic hysterectomy. Patient is recovering well from radiation. Her symptoms are improving. She is not a smoker. She continues to have some diarrhea and incontinence.   Past Surgical History  Gallbladder Surgery - Laparoscopic Hysterectomy (due to cancer) - Complete Hysterectomy (not due to cancer) - Complete  Diagnostic Studies History  Colonoscopy within last year Mammogram 1-3 years ago Pap Smear 1-5 years ago  Allergies Malachi Bonds, CMA; 07/03/2020 9:48 AM) Penicillins  Medication History  Albuterol Sulfate (0.63MG /3ML Nebulized Soln, Inhalation) Active. Aspirin (81MG  Tablet, Oral) Active. Atorvastatin Calcium (40MG  Tablet, Oral) Active. Symbicort (160-4.5MCG/ACT Aerosol, Inhalation) Active. Bentyl (10MG  Capsule, Oral) Active. Vitamin D (50000UNIT Capsule, Oral) Active. Exenatide ER (2MG  Pen-injector, Subcutaneous) Active. Furosemide (20MG  Tablet, Oral) Active. Gabapentin (300MG  Capsule, Oral) Active. Hydrocodone-Acetaminophen (7.5-500MG  Tablet, Oral) Active. Levothyroxine Sodium (100MCG Tablet, Oral) Active. LORazepam (0.5MG  Tablet, Oral) Active. Meloxicam (15MG  Tablet, Oral) Active. metFORMIN HCl (500MG  Tablet, Oral) Active. Singulair (10MG  Tablet, Oral) Active. oxyCODONE HCl (5MG  Tablet, Oral) Active. Pantoprazole Sodium (40MG  Tablet  DR, Oral) Active. Potassium Chloride (20MEQ Tablet ER, Oral) Active. Compazine (10MG  Tablet, Oral) Active. Medications Reconciled  Social History  Caffeine use Carbonated beverages, Coffee, Tea. No alcohol use No drug use Tobacco use Never smoker.  Family History  Alcohol Abuse Daughter, Father. Arthritis Father, Mother. Colon Polyps Father. Diabetes Mellitus Father, Mother, Sister. Heart Disease Mother. Hypertension Mother. Respiratory Condition Daughter, Sister.  Pregnancy / Birth History  Age at menarche 29 years. Gravida 3 Maternal age 50-25 Para 3  Other Problems  Asthma Back Pain Cholelithiasis Colon Cancer Diabetes Mellitus Gastric Ulcer Gastroesophageal Reflux Disease High blood pressure Hypercholesterolemia Thyroid Disease     Review of Systems  General Present- Appetite Loss, Fatigue and Weight Loss. Not Present- Chills, Fever, Night Sweats and Weight Gain. Skin Not Present- Change in Wart/Mole, Dryness, Hives, Jaundice, New Lesions, Non-Healing Wounds, Rash and Ulcer. HEENT Present- Seasonal Allergies and Wears glasses/contact lenses. Not Present- Earache, Hearing Loss, Hoarseness, Nose Bleed, Oral Ulcers, Ringing in the Ears, Sinus Pain, Sore Throat, Visual Disturbances and Yellow Eyes. Respiratory Present- Snoring. Not Present- Bloody sputum, Chronic Cough, Difficulty Breathing and Wheezing. Breast Not Present- Breast Mass, Breast Pain, Nipple Discharge and Skin Changes. Cardiovascular Present- Palpitations. Not Present- Chest Pain, Difficulty Breathing Lying Down, Leg Cramps, Rapid Heart Rate, Shortness of Breath and Swelling of Extremities. Gastrointestinal Present- Abdominal Pain, Chronic diarrhea, Nausea and Vomiting. Not Present- Bloating, Bloody Stool, Change in Bowel Habits, Constipation, Difficulty Swallowing, Excessive gas, Gets full quickly at meals, Hemorrhoids, Indigestion and Rectal Pain. Female Genitourinary  Present- Frequency. Not Present- Nocturia, Painful Urination, Pelvic Pain and Urgency. Musculoskeletal Present- Back Pain. Not Present- Joint Pain, Joint Stiffness, Muscle Pain, Muscle Weakness and Swelling of Extremities. Neurological Not Present- Decreased Memory, Fainting, Headaches, Numbness, Seizures, Tingling, Tremor, Trouble walking and Weakness. Psychiatric Not Present- Anxiety, Bipolar, Change in Sleep Pattern, Depression, Fearful and Frequent crying. Endocrine Not Present- Cold Intolerance, Excessive Hunger, Hair Changes, Heat Intolerance, Hot  flashes and New Diabetes. Hematology Not Present- Blood Thinners, Easy Bruising, Excessive bleeding, Gland problems, HIV and Persistent Infections.  Vitals (Chemira Jones CMA; 07/03/2020 9:48 AM) 07/03/2020 9:48 AM Weight: 174.8 lb Height: 63in Body Surface Area: 1.83 m Body Mass Index: 30.96 kg/m  BP: 130/80(Sitting, Left Arm, Standard)  Physical Exam   General Mental Status-Alert. General Appearance-Cooperative.  Abdomen Palpation/Percussion Palpation and Percussion of the abdomen reveal - Soft and Non Tender.  Rectal Anorectal Exam Internal - normal sphincter tone. Note: Mass palpated in the left posterior lateral distal rectum, approximately 1 cm margin between tumor and sphincter complex.    Assessment & Plan   RECTAL CANCER (C20) Impression: 60 year old female who presents to the office for evaluation of rectal cancer. She has just completed chemotherapy and radiation for a distal rectal cancer, clinical stage III. Marland Kitchen Digital rectal exam reveals a left posterior lateral tumor with approximately 1 cm margin to the top of the anal canal. We will get an open MRI to evaluate for possibility of low anterior resection versus abdominal perineal resection. We have discussed those options in detail. Patient agrees that priority should be placed on removing her cancer safely first and keeping her continent second. We discussed  the need for at least a temporary ileostomy. We discussed the timing of surgery. The surgery and anatomy were described to the patient as well as the risks of surgery and the possible complications. These include: Bleeding, deep abdominal infections and possible wound complications such as hernia and infection, damage to adjacent structures, leak of surgical connections, which can lead to other surgeries and possibly an ostomy, possible need for other procedures, such as abscess drains in radiology, possible prolonged hospital stay, possible diarrhea from removal of part of the colon, possible constipation from narcotics, possible bowel, bladder or sexual dysfunction if having rectal surgery, prolonged fatigue/weakness or appetite loss, possible early recurrence of of disease, possible complications of their medical problems such as heart disease or arrhythmias or lung problems, death (less than 1%). I believe the patient understands and wishes to proceed with the surgery.

## 2020-07-04 ENCOUNTER — Other Ambulatory Visit: Payer: Self-pay | Admitting: Hematology and Oncology

## 2020-07-04 ENCOUNTER — Inpatient Hospital Stay: Payer: Medicaid Other

## 2020-07-04 ENCOUNTER — Other Ambulatory Visit: Payer: Self-pay

## 2020-07-04 LAB — HEPATIC FUNCTION PANEL
ALT: 25 (ref 7–35)
AST: 16 (ref 13–35)
Alkaline Phosphatase: 48 (ref 25–125)
Bilirubin, Total: 0.6

## 2020-07-04 LAB — CBC AND DIFFERENTIAL
HCT: 34 — AB (ref 36–46)
Hemoglobin: 11.7 — AB (ref 12.0–16.0)
Neutrophils Absolute: 2.74
Platelets: 295 (ref 150–399)
WBC: 3.8

## 2020-07-04 LAB — BASIC METABOLIC PANEL
BUN: 8 (ref 4–21)
CO2: 33 — AB (ref 13–22)
Chloride: 98 — AB (ref 99–108)
Creatinine: 0.6 (ref 0.5–1.1)
Glucose: 148
Potassium: 2.7 — AB (ref 3.4–5.3)
Sodium: 141 (ref 137–147)

## 2020-07-04 LAB — CBC: RBC: 4.03 (ref 3.87–5.11)

## 2020-07-04 LAB — COMPREHENSIVE METABOLIC PANEL
Albumin: 4.2 (ref 3.5–5.0)
Calcium: 9 (ref 8.7–10.7)

## 2020-07-05 ENCOUNTER — Inpatient Hospital Stay: Payer: Medicaid Other

## 2020-07-05 VITALS — BP 144/70 | HR 66 | Temp 98.0°F | Resp 18 | Ht 63.0 in | Wt 174.0 lb

## 2020-07-05 DIAGNOSIS — E876 Hypokalemia: Secondary | ICD-10-CM

## 2020-07-05 DIAGNOSIS — Z5111 Encounter for antineoplastic chemotherapy: Secondary | ICD-10-CM | POA: Diagnosis not present

## 2020-07-05 MED ORDER — HEPARIN SOD (PORK) LOCK FLUSH 100 UNIT/ML IV SOLN
500.0000 [IU] | Freq: Once | INTRAVENOUS | Status: AC | PRN
Start: 2020-07-05 — End: 2020-07-05
  Administered 2020-07-05: 500 [IU]
  Filled 2020-07-05: qty 5

## 2020-07-05 MED ORDER — SODIUM CHLORIDE 0.9 % IV SOLN
Freq: Once | INTRAVENOUS | Status: AC
  Filled 2020-07-05: qty 250

## 2020-07-05 MED ORDER — POTASSIUM CHLORIDE 10 MEQ/100ML IV SOLN
INTRAVENOUS | Status: AC
  Filled 2020-07-05: qty 300

## 2020-07-05 MED ORDER — POTASSIUM CHLORIDE 10 MEQ/100ML IV SOLN
10.0000 meq | INTRAVENOUS | Status: AC
  Administered 2020-07-05 (×3): 10 meq via INTRAVENOUS

## 2020-07-05 NOTE — Addendum Note (Signed)
Addended by: Juanetta Beets on: 07/05/2020 10:18 AM   Modules accepted: Orders

## 2020-07-05 NOTE — Patient Instructions (Signed)

## 2020-07-05 NOTE — Addendum Note (Signed)
Addended by: Juanetta Beets on: 07/05/2020 10:15 AM   Modules accepted: Orders

## 2020-07-12 NOTE — Progress Notes (Incomplete)
[facility]  [address] [place],  [place]  [address] [phone]  Clinic Day:  07/12/2020  Referring physician: Denzil Magnuson *   This document serves as a record of services personally performed by Hosie Poisson, MD. It was created on their behalf by Curry,Lauren E, a trained medical scribe. The creation of this record is based on the scribe's personal observations and the provider's statements to them.  CHIEF COMPLAINT:  CC:  Rectal cancer  Current Treatment:  Neoadjuvant chemoradiation with 5 FU infusion of the 1st and 5th week   HISTORY OF PRESENT ILLNESS:  Tanya Mcclure is a 60 y.o. female with rectal cancer, diagnosed in December 2021.  This began when the patient started to experience daily rectal bleeding with bright red blood and urgency.   She had also been having persistent diarrhea for the past two years which gradually worsened over that time.  She was referred to Dr. Melina Copa and underwent colonoscopy on December 10th.  Evaluation showed a rectal mass beginning at about 2 cm from the anal verge and was noted to obstruct 50% of the circumference of the rectum.  Benign polyps were also present and removed.  She had never undergone colonoscopy prior to this procedure.   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   CT abdomen and pelvis from December 14th revealed several small perirectal and presacral lymph nodes, highly concerning for metastatic involvement.  No other evidence of metastatic disease was observed.  She was seen by Dr. Lilia Pro in consultation to discuss surgical resection, and he has recommended neoadjuvant chemoradiation.  She continued to have significant rectal bleeding to the point where she requires a pad.  She did have evidence of ulcer on EGD and so she has discontinued ibuprofen, and she has been placed on pantoprazole 40 mg BID.  She has not had a mammogram within the past year and has never undergone a  bone density scan.  She started concurrent chemoradiation and received her 1st 5FU infusion on January 10th.     INTERVAL HISTORY:  She is here for routine follow up after completing a 50.4 Gy course for a total of 27 fractions of radiation therapy on February 16th.  She states that her bowel movements have improved with Lomotil.  Otherwise, she has been doing well.  Blood counts and chemistries are unremarkable except for a potassium of 3.0.  She has already received IV potassium on Wednesday, when her level was 2.9.  We will administer 20 meq of IV potassium today, and place her on oral supplement 20 BID.  Her  appetite is good, and she has lost nearly 8 pounds since her last visit.  She denies fever, chills or other signs of infection.  She denies nausea, vomiting, bowel issues, or abdominal pain.  She denies sore throat, cough, dyspnea, or chest pain.  She is here for routine follow up ***.   Her  appetite is good, and she has gained/lost _ pounds since her last visit.  She denies fever, chills or other signs of infection.  She denies nausea, vomiting, bowel issues, or abdominal pain.  She denies sore throat, cough, dyspnea, or chest pain.  REVIEW OF SYSTEMS:  Review of Systems - Oncology   VITALS:  There were no vitals taken for this visit.  Wt Readings from Last 3 Encounters:  07/05/20 174 lb (78.9 kg)  06/29/20 175 lb 12 oz (79.7 kg)  06/29/20 175 lb 9.6 oz (79.7 kg)  There is no height or weight on file to calculate BMI.  Performance status (ECOG): 1 - Symptomatic but completely ambulatory  PHYSICAL EXAM:  Physical Exam  LABS:   CBC Latest Ref Rng & Units 07/04/2020 06/29/2020 06/14/2020  WBC - 3.8 3.9 4.0  Hemoglobin 12.0 - 16.0 11.7(A) 12.2 11.7(A)  Hematocrit 36 - 46 34(A) 36 34(A)  Platelets 150 - 399 295 252 260   CMP Latest Ref Rng & Units 07/04/2020 06/29/2020 06/14/2020  Glucose - - - -  BUN 4 - $R'21 8 6 7  'DX$ Creatinine 0.5 - 1.1 0.6 0.6 0.6  Sodium 137 - 147 141 137 138   Potassium 3.4 - 5.3 2.7(A) 3.0(A) 3.7  Chloride 99 - 108 98(A) 99 100  CO2 13 - 22 33(A) 30(A) 32(A)  Calcium 8.7 - 10.7 9.0 9.1 9.6  Alkaline Phos 25 - 125 48 48 43  AST 13 - 35 16 36(A) 20  ALT 7 - 35 $Re'25 19 15     'Dml$ Lab Results  Component Value Date   CEA1 56.9 (H) 05/10/2020   /  CEA  Date Value Ref Range Status  05/10/2020 56.9 (H) 0.0 - 4.7 ng/mL Final    Comment:    (NOTE)                             Nonsmokers          <3.9                             Smokers             <5.6 [employer] Electrochemiluminescence Immunoassay (ECLIA) Values obtained with different assay methods or kits cannot be used interchangeably.  Results cannot be interpreted as absolute evidence of the presence or absence of malignant disease. Performed At: [facility] [place], [place] [id number] Rush Farmer MD HX:[id number]      STUDIES:   No current studies  HISTORY:     Allergies:  Allergies  Allergen Reactions  . Penicillins Rash    Current Medications: Current Outpatient Medications  Medication Sig Dispense Refill  . Accu-Chek Softclix Lancets lancets USE TO CHECK BLOOD GLUCOSE TWICE DAILY.    Marland Kitchen acetaminophen (TYLENOL) 500 MG tablet Take by mouth.    Marland Kitchen albuterol (ACCUNEB) 0.63 MG/3ML nebulizer solution Inhale into the lungs.    Marland Kitchen albuterol (VENTOLIN HFA) 108 (90 Base) MCG/ACT inhaler Inhale into the lungs.    Marland Kitchen aspirin 81 MG EC tablet Take by mouth.    Marland Kitchen atorvastatin (LIPITOR) 40 MG tablet Take by mouth.    . budesonide-formoterol (SYMBICORT) 160-4.5 MCG/ACT inhaler Inhale into the lungs.    . dicyclomine (BENTYL) 10 MG capsule Take 1 capsule (10 mg total) by mouth 4 (four) times daily -  before meals and at bedtime. 90 capsule 0  . diphenoxylate-atropine (LOMOTIL) 2.5-0.025 MG tablet Take 2 tablets by mouth 4 (four) times daily as needed for diarrhea or loose stools. 30 tablet 0  . ergocalciferol (VITAMIN D2) 1.25 MG (50000 UT) capsule Take  by mouth.    . Exenatide ER 2 MG PEN Inject into the skin.    . furosemide (LASIX) 20 MG tablet Take 1 tablet by mouth daily.    Marland Kitchen gabapentin (NEURONTIN) 300 MG capsule TAKE 1 CAPSULE BY MOUTH TWICE DAILY AS NEEDED    . glucose blood (ACCU-CHEK AVIVA PLUS)  test strip Check blood glucose twice daily and as needed. Dx E 11.65    . HYDROcodone-acetaminophen (NORCO) 7.5-325 MG tablet Take 1 tablet by mouth every 6 (six) hours as needed for moderate pain. 120 tablet 0  . Lancets Misc. (ACCU-CHEK FASTCLIX LANCET) KIT Check blood glucose twice daily. Dx E 11.65    . levothyroxine (SYNTHROID) 100 MCG tablet TAKE ONE TABLET BY MOUTH ONCE DAILY AT 6AM.    . LORazepam (ATIVAN) 0.5 MG tablet Take by mouth.    . meloxicam (MOBIC) 15 MG tablet Take 1 tablet (15 mg total) by mouth daily. 30 tablet 0  . metFORMIN (GLUCOPHAGE-XR) 500 MG 24 hr tablet Take 1 tablet by mouth 2 (two) times daily with a meal.    . methylPREDNISolone (MEDROL DOSEPAK) 4 MG TBPK tablet Take 1st as instructed 21 tablet 0  . montelukast (SINGULAIR) 10 MG tablet Take by mouth.    . ondansetron (ZOFRAN) 4 MG tablet Take 1 tablet (4 mg total) by mouth every 4 (four) hours as needed for nausea. 90 tablet 3  . oxyCODONE (OXY IR/ROXICODONE) 5 MG immediate release tablet Take 5 mg by mouth every 4 (four) hours as needed.    . pantoprazole (PROTONIX) 40 MG tablet Take 40 mg by mouth 2 (two) times daily.    . potassium chloride SA (KLOR-CON) 20 MEQ tablet Take 1 tablet (20 mEq total) by mouth 2 (two) times daily. 60 tablet 3  . prochlorperazine (COMPAZINE) 10 MG tablet Take 1 tablet (10 mg total) by mouth every 6 (six) hours as needed for nausea or vomiting. 90 tablet 3  . SUTAB [phone] MG TABS TAKE 24 TABLETS BY MOUTH AS DIRECTED.     Current Facility-Administered Medications  Medication Dose Route Frequency Provider Last Rate Last Admin  . triamcinolone acetonide (KENALOG) 10 MG/ML injection 10 mg  10 mg Other Once Converse, Titorya, DPM       . triamcinolone acetonide (KENALOG-40) injection 20 mg  20 mg Other Once Landis Martins, DPM         ASSESSMENT & PLAN:   Assessment:   1.  Rectal cancer, diagnosed in December 2021.  She has several small perirectal and presacral lymph nodes, highly concerning for metastatic involvement, but no other evidence of metastatic disease was observed.  This is likely a stage III based on the adenopathy seen.  Baseline CEA was 56.9.  She started chemoradiation in early January and completed treatment on February 16th.  As she has completed neoadjuvant therapy, we will refer her for surgical resection.  2.  Benign polyps, removed at time of colonoscopy.  3.  History of peptic ulcer disease.  4.  Persistent severe hypokalemia.  We will administer 20 meq IV today and place her on oral supplement 20 meq BID.  We will recheck this next week.  Plan: She completed radiation therapy on February 16th and is doing well.  Since she has completed neoadjuvant treatment, we will refer her for surgical resection.  I am not sure if they want further imaging prior to that.  Later we will plan for FOLFOX chemotherapy following her procedure.  Due to her persistent severe hypokalemia, we will administer 20 meq of IV potassium today, and place her on oral supplement 20 meq BID.  In 1 week, we will recheck CBC and CMP for continued supportive care.  We will plan to see her back in 2 weeks with CBC, CMP and CEA for repeat evaluation.  She understands and agrees with  this plan of care.  I provided 20 minutes of face-to-face time during this this encounter and > 50% was spent counseling as documented under my assessment and plan.    Derwood Kaplan, MD [facility] AT [facility] [address] [place] [address] Dept: [phone] Dept Fax: [phone]   I, Rita Ohara, am acting as scribe for Derwood Kaplan, MD  I have reviewed this report as typed  by the medical scribe, and it is complete and accurate.

## 2020-07-13 ENCOUNTER — Encounter: Payer: Self-pay | Admitting: Oncology

## 2020-07-13 ENCOUNTER — Telehealth: Payer: Self-pay | Admitting: Oncology

## 2020-07-13 ENCOUNTER — Other Ambulatory Visit: Payer: Self-pay | Admitting: Hematology and Oncology

## 2020-07-13 ENCOUNTER — Inpatient Hospital Stay (INDEPENDENT_AMBULATORY_CARE_PROVIDER_SITE_OTHER): Payer: Medicaid Other | Admitting: Oncology

## 2020-07-13 ENCOUNTER — Inpatient Hospital Stay: Payer: Medicaid Other | Attending: Oncology

## 2020-07-13 ENCOUNTER — Other Ambulatory Visit: Payer: Self-pay | Admitting: Oncology

## 2020-07-13 ENCOUNTER — Other Ambulatory Visit: Payer: Self-pay

## 2020-07-13 VITALS — BP 160/70 | HR 65 | Temp 98.6°F | Resp 18 | Ht 63.0 in | Wt 173.5 lb

## 2020-07-13 DIAGNOSIS — C20 Malignant neoplasm of rectum: Secondary | ICD-10-CM

## 2020-07-13 LAB — CBC
Absolute Lymphocytes: 0.56 — AB (ref 0.65–4.75)
MCV: 85 (ref 81–99)
RBC: 3.95 (ref 3.87–5.11)

## 2020-07-13 LAB — CBC AND DIFFERENTIAL
HCT: 34 — AB (ref 36–46)
Hemoglobin: 11.3 — AB (ref 12.0–16.0)
Neutrophils Absolute: 2.05
Platelets: 268 (ref 150–399)
WBC: 3.3

## 2020-07-13 LAB — BASIC METABOLIC PANEL
BUN: 9 (ref 4–21)
CO2: 31 — AB (ref 13–22)
Chloride: 102 (ref 99–108)
Creatinine: 0.6 (ref 0.5–1.1)
Glucose: 139
Potassium: 3.3 — AB (ref 3.4–5.3)
Sodium: 139 (ref 137–147)

## 2020-07-13 LAB — HEPATIC FUNCTION PANEL
ALT: 13 (ref 7–35)
AST: 25 (ref 13–35)
Alkaline Phosphatase: 42 (ref 25–125)
Bilirubin, Total: 0.6

## 2020-07-13 LAB — COMPREHENSIVE METABOLIC PANEL
Albumin: 4.1 (ref 3.5–5.0)
Calcium: 8.9 (ref 8.7–10.7)

## 2020-07-13 MED ORDER — DIPHENOXYLATE-ATROPINE 2.5-0.025 MG PO TABS: 2.0000 | ORAL_TABLET | Freq: Four times a day (QID) | ORAL | 0 refills | Status: DC | PRN

## 2020-07-13 NOTE — Telephone Encounter (Signed)
Per 3/4 LOS, patient scheduled for 3/18 Labs, 5/18 Labs, Follow Up w/Kelli.  Gave patient Appt Summary

## 2020-07-13 NOTE — Progress Notes (Unsigned)
t

## 2020-07-13 NOTE — Progress Notes (Signed)
[facility]  [address] [place],  [place]  [address] [phone]  Clinic Day:  07/13/2020  Referring physician: Denzil Magnuson *   This document serves as a record of services personally performed by Hosie Poisson, MD. It was created on their behalf by Curry,Lauren E, a trained medical scribe. The creation of this record is based on the scribe's personal observations and the provider's statements to them.  CHIEF COMPLAINT:  CC:  Rectal cancer  Current Treatment:  Surgical resection is scheduled for April 13th, following her procedure we will plan for adjuvant FOLFOX chemotherapy   HISTORY OF PRESENT ILLNESS:  Tanya Mcclure is a 60 y.o. female with stage IIIB ([id number]) rectal cancer, diagnosed in December 2021.  This began when the patient started to experience daily rectal bleeding with bright red blood and urgency.   She had also been having persistent diarrhea for the past two years which gradually worsened over that time.  She was referred to Dr. Melina Copa and underwent colonoscopy on December 10th.  Evaluation showed a rectal mass beginning at about 2 cm from the anal verge and was noted to obstruct 50% of the circumference of the rectum.  Benign polyps were also present and removed.  She had never undergone colonoscopy prior to this procedure.   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   CT abdomen and pelvis from December 14th revealed several small perirectal and presacral lymph nodes, highly concerning for metastatic involvement.  No other evidence of metastatic disease was observed.  She was seen by Dr. Lilia Pro in consultation to discuss surgical resection, and he has recommended neoadjuvant chemoradiation. She did have evidence of ulcer on EGD and so she has discontinued ibuprofen, and she has been placed on pantoprazole 40 mg BID.  She has not had a mammogram within the past year and has never undergone a bone density scan.  She  started concurrent chemoradiation and received her 1st 5FU infusion on January 10th.   She completed a 50.4 Gy course for a total of 27 fractions of radiation therapy on February 16th.   She states that her bowel movements have improved with Lomotil.  Her potassium dropped to as low as 2.7 and she was given IV potassium and placed on 20 meq BID orally.  INTERVAL HISTORY:  Tanya Mcclure is here for routine follow up and states that she has been well.  She has met with Dr. Leighton Ruff who is planning to obtain an MRI to finalize her surgical plan.  Surgical resection is scheduled for April 13th.  She did have an episode of bloody diarrhea after she saw Dr. Marcello Moores with a rectal exam, but has not had recurrent bleeding.  She states that she was ill over the weekend with a fever or 101.3 for two days.  She had 1 episode of vomiting, but started to improve after that.  Her white count has mildly decreased from 3.8 to 3.3 with an [place] of 2050, her hemoglobin has mildly decreased from 11.7 to 11.3, and her platelet count is normal.  Chemistries are unremarkable except for a potassium of 3.3, improved from 2.7.  She was taking oral potassium supplement 20 meq BID for 1 week, but states that these went missing after she had some painters come by.  She will resume this supplement and I will send in a new prescription today.  Her  appetite is good, and her weight is stable since her last visit.  She denies fever,  chills or other signs of infection.  She denies nausea, vomiting, bowel issues, or abdominal pain.  She denies sore throat, cough, dyspnea, or chest pain.  REVIEW OF SYSTEMS:  Review of Systems  Constitutional: Negative for appetite change, chills, fatigue, fever and unexpected weight change.  HENT:  Negative.   Eyes: Negative.   Respiratory: Negative.  Negative for chest tightness, cough, hemoptysis, shortness of breath and wheezing.   Cardiovascular: Negative.  Negative for chest pain, leg swelling and  palpitations.  Gastrointestinal: Positive for blood in stool (1 episode after rectal exam, this has not recurred). Negative for abdominal distention, abdominal pain, constipation, diarrhea, nausea and vomiting.  Endocrine: Negative.   Genitourinary: Negative.  Negative for difficulty urinating, dysuria, frequency and hematuria.   Musculoskeletal: Negative.  Negative for arthralgias, back pain, flank pain, gait problem and myalgias.  Skin: Negative.   Neurological: Negative.  Negative for dizziness, extremity weakness, gait problem, headaches, light-headedness, numbness, seizures and speech difficulty.  Hematological: Negative.   Psychiatric/Behavioral: Negative.  Negative for depression and sleep disturbance. The patient is not nervous/anxious.      VITALS:  Blood pressure (!) 160/70, pulse 65, temperature 98.6 F (37 C), temperature source Oral, resp. rate 18, height $RemoveBe'5\' 3"'gHpsITOeR$  (1.6 m), weight 173 lb 8 oz (78.7 kg), SpO2 97 %.  Wt Readings from Last 3 Encounters:  07/13/20 173 lb 8 oz (78.7 kg)  07/05/20 174 lb (78.9 kg)  06/29/20 175 lb 12 oz (79.7 kg)    Body mass index is 30.73 kg/m.  Performance status (ECOG): 0 - Asymptomatic  PHYSICAL EXAM:  Physical Exam Constitutional:      General: She is not in acute distress.    Appearance: Normal appearance. She is normal weight.  HENT:     Head: Normocephalic and atraumatic.  Eyes:     General: No scleral icterus.    Extraocular Movements: Extraocular movements intact.     Conjunctiva/sclera: Conjunctivae normal.     Pupils: Pupils are equal, round, and reactive to light.  Cardiovascular:     Rate and Rhythm: Normal rate and regular rhythm.     Pulses: Normal pulses.     Heart sounds: Normal heart sounds. No murmur heard. No friction rub. No gallop.   Pulmonary:     Effort: Pulmonary effort is normal. No respiratory distress.     Breath sounds: Normal breath sounds.  Abdominal:     General: Bowel sounds are normal. There is no  distension.     Palpations: Abdomen is soft. There is no hepatomegaly, splenomegaly or mass.     Tenderness: There is no abdominal tenderness.  Musculoskeletal:        General: Normal range of motion.     Cervical back: Normal range of motion and neck supple.     Right lower leg: No edema.     Left lower leg: No edema.  Lymphadenopathy:     Cervical: No cervical adenopathy.  Skin:    General: Skin is warm and dry.  Neurological:     General: No focal deficit present.     Mental Status: She is alert and oriented to person, place, and time. Mental status is at baseline.  Psychiatric:        Mood and Affect: Mood normal.        Behavior: Behavior normal.        Thought Content: Thought content normal.        Judgment: Judgment normal.     LABS:  CBC Latest Ref Rng & Units 07/04/2020 06/29/2020 06/14/2020  WBC - 3.8 3.9 4.0  Hemoglobin 12.0 - 16.0 11.7(A) 12.2 11.7(A)  Hematocrit 36 - 46 34(A) 36 34(A)  Platelets 150 - 399 295 252 260   CMP Latest Ref Rng & Units 07/04/2020 06/29/2020 06/14/2020  Glucose - - - -  BUN 4 - _0 Creatinine 0.5 - 1.1 0.6 0.6 0.6  Sodium 137 - 147 141 137 138  Potassium 3.4 - 5.3 2.7(A) 3.0(A) 3.7  Chloride 99 - 108 98(A) 99 100  CO2 13 - 22 33(A) 30(A) 32(A)  Calcium 8.7 - 10.7 9.0 9.1 9.6  Alkaline Phos 25 - 125 48 48 43  AST 13 - 35 16 36(A) 20  ALT 7 - 35 _1 Lab Results  Component Value Date   CEA1 56.9 (H) 05/10/2020   /  CEA  Date Value Ref Range Status  05/10/2020 56.9 (H) 0.0 - 4.7 ng/mL Final    Comment:    (NOTE)                             Nonsmokers          <3.9                             Smokers             <5.6 [employer] Electrochemiluminescence Immunoassay (ECLIA) Values obtained with different assay methods or kits cannot be used interchangeably.  Results cannot be interpreted as absolute evidence of the presence or absence of malignant disease. Performed At: [facility] [place], [place] [id number] Rush Farmer MD BE:[id number]      STUDIES:   No current studies  HISTORY:     Allergies:  Allergies  Allergen Reactions   Penicillins Rash    Current Medications: Current Outpatient Medications  Medication Sig Dispense Refill   Accu-Chek Softclix Lancets lancets USE TO CHECK BLOOD GLUCOSE TWICE DAILY.     acetaminophen (TYLENOL) 500 MG tablet Take by mouth.     albuterol (ACCUNEB) 0.63 MG/3ML nebulizer solution Inhale into the lungs.     albuterol (VENTOLIN HFA) 108 (90 Base) MCG/ACT inhaler Inhale into the lungs.     aspirin 81 MG EC tablet Take by mouth.     atorvastatin (LIPITOR) 40 MG tablet Take by mouth.     budesonide-formoterol (SYMBICORT) 160-4.5 MCG/ACT inhaler Inhale into the lungs.     dicyclomine (BENTYL) 10 MG capsule Take 1 capsule (10 mg total) by mouth 4 (four) times daily -  before meals and at bedtime. 90 capsule 0   diphenoxylate-atropine (LOMOTIL) 2.5-0.025 MG tablet Take 2 tablets by mouth 4 (four) times daily as needed for diarrhea or loose stools. 30 tablet 0   ergocalciferol (VITAMIN D2) 1.25 MG (50000 UT) capsule Take by mouth.     Exenatide ER 2 MG PEN Inject into the skin.     furosemide (LASIX) 20 MG tablet Take 1 tablet by mouth daily.     gabapentin (NEURONTIN) 300 MG capsule TAKE 1 CAPSULE BY MOUTH TWICE DAILY AS NEEDED     glucose blood (ACCU-CHEK AVIVA PLUS) test strip Check blood glucose twice daily and as needed. Dx E 11.65     HYDROcodone-acetaminophen (NORCO) 7.5-325 MG tablet Take 1 tablet by mouth every 6 (six) hours as  needed for moderate pain. 120 tablet 0   Lancets Misc. (ACCU-CHEK FASTCLIX LANCET) KIT Check blood glucose twice daily. Dx E 11.65     levothyroxine (SYNTHROID) 100 MCG tablet TAKE ONE TABLET BY MOUTH ONCE DAILY AT 6AM.     LORazepam (ATIVAN) 0.5 MG tablet Take by mouth.     meloxicam (MOBIC) 15 MG tablet Take 1 tablet (15 mg total) by mouth daily. 30 tablet 0    metFORMIN (GLUCOPHAGE-XR) 500 MG 24 hr tablet Take 1 tablet by mouth 2 (two) times daily with a meal.     methylPREDNISolone (MEDROL DOSEPAK) 4 MG TBPK tablet Take 1st as instructed 21 tablet 0   montelukast (SINGULAIR) 10 MG tablet Take by mouth.     ondansetron (ZOFRAN) 4 MG tablet Take 1 tablet (4 mg total) by mouth every 4 (four) hours as needed for nausea. 90 tablet 3   oxyCODONE (OXY IR/ROXICODONE) 5 MG immediate release tablet Take 5 mg by mouth every 4 (four) hours as needed.     pantoprazole (PROTONIX) 40 MG tablet Take 40 mg by mouth 2 (two) times daily.     potassium chloride SA (KLOR-CON) 20 MEQ tablet Take 1 tablet (20 mEq total) by mouth 2 (two) times daily. 60 tablet 3   prochlorperazine (COMPAZINE) 10 MG tablet Take 1 tablet (10 mg total) by mouth every 6 (six) hours as needed for nausea or vomiting. 90 tablet 3   SUTAB [id number] MG TABS TAKE 24 TABLETS BY MOUTH AS DIRECTED.     Current Facility-Administered Medications  Medication Dose Route Frequency Provider Last Rate Last Admin   triamcinolone acetonide (KENALOG) 10 MG/ML injection 10 mg  10 mg Other Once Landis Martins, DPM       triamcinolone acetonide (KENALOG-40) injection 20 mg  20 mg Other Once Landis Martins, DPM         ASSESSMENT & PLAN:   Assessment:   1.  Stage IIIB ([id number]) rectal cancer, diagnosed in December 2021.  She has several small perirectal and presacral lymph nodes, highly concerning for metastatic involvement, but no other evidence of metastatic disease was observed.  Baseline CEA was 56.9.  She started chemoradiation in early January and completed treatment on February 16th.  She has completed neoadjuvant therapy, and is scheduled for surgical resection with Dr. Marcello Moores on April 13th.  2.  Benign polyps, removed at time of colonoscopy.  3.  History of peptic ulcer disease.  4.  Hypokalemia, improved.  She was on oral supplement 20 meq BID, but was only able to take this for 1 week.   I will send in a new prescription today, and instructed her to resume this.  Plan: She has met with Dr. Marcello Moores of general surgery and surgical resection has been scheduled for April 13th.  Following her procedure we will plan for FOLFOX chemotherapy.  I will send in a new prescription for oral potassium 20 meq BID and Lomotil.  She will return in 2 weeks for CBC and CMP.  We will plan to see her back in mid May with CBC, CMP and CEA for repeat evaluation and to discuss adjuvant chemotherapy.  She understands and agrees with this plan of care.  I provided 20 minutes of face-to-face time during this this encounter and > 50% was spent counseling as documented under my assessment and plan.    Derwood Kaplan, MD [facility] [address] [place] [address] Dept: [phone]  Dept Fax: [phone]   I, Rita Ohara, am acting as scribe for Derwood Kaplan, MD  I have reviewed this report as typed by the medical scribe, and it is complete and accurate.

## 2020-07-14 LAB — CEA: CEA: 18 ng/mL — ABNORMAL HIGH (ref 0.0–4.7)

## 2020-07-16 ENCOUNTER — Other Ambulatory Visit (HOSPITAL_COMMUNITY): Payer: Self-pay | Admitting: General Surgery

## 2020-07-16 ENCOUNTER — Other Ambulatory Visit: Payer: Self-pay | Admitting: General Surgery

## 2020-07-16 DIAGNOSIS — C20 Malignant neoplasm of rectum: Secondary | ICD-10-CM

## 2020-07-21 ENCOUNTER — Ambulatory Visit (HOSPITAL_COMMUNITY)
Admission: RE | Admit: 2020-07-21 | Discharge: 2020-07-21 | Disposition: A | Discharge status: Home / Self Care | Payer: Medicaid Other | Source: Ambulatory Visit | Attending: General Surgery | Admitting: General Surgery

## 2020-07-21 ENCOUNTER — Other Ambulatory Visit (HOSPITAL_COMMUNITY): Payer: Self-pay | Admitting: General Surgery

## 2020-07-21 ENCOUNTER — Other Ambulatory Visit: Payer: Self-pay

## 2020-07-21 DIAGNOSIS — C20 Malignant neoplasm of rectum: Secondary | ICD-10-CM | POA: Diagnosis not present

## 2020-07-27 ENCOUNTER — Other Ambulatory Visit: Payer: Self-pay | Admitting: Hematology and Oncology

## 2020-07-27 ENCOUNTER — Other Ambulatory Visit: Payer: Self-pay

## 2020-07-27 ENCOUNTER — Telehealth: Payer: Self-pay

## 2020-07-27 ENCOUNTER — Inpatient Hospital Stay: Payer: Medicaid Other

## 2020-07-27 DIAGNOSIS — C20 Malignant neoplasm of rectum: Secondary | ICD-10-CM

## 2020-07-27 LAB — HEPATIC FUNCTION PANEL
ALT: 17 (ref 7–35)
AST: 28 (ref 13–35)
Alkaline Phosphatase: 55 (ref 25–125)
Bilirubin, Total: 0.5

## 2020-07-27 LAB — BASIC METABOLIC PANEL
BUN: 10 (ref 4–21)
CO2: 30 — AB (ref 13–22)
Chloride: 98 — AB (ref 99–108)
Creatinine: 0.6 (ref 0.5–1.1)
Glucose: 209
Potassium: 3.8 (ref 3.4–5.3)
Sodium: 137 (ref 137–147)

## 2020-07-27 LAB — CBC
MCV: 85 (ref 81–99)
RBC: 4.51 (ref 3.87–5.11)

## 2020-07-27 LAB — CBC AND DIFFERENTIAL
HCT: 38 (ref 36–46)
Hemoglobin: 12.9 (ref 12.0–16.0)
Neutrophils Absolute: 2.68
Platelets: 281 (ref 150–399)
WBC: 4

## 2020-07-27 LAB — COMPREHENSIVE METABOLIC PANEL
Albumin: 4.5 (ref 3.5–5.0)
Calcium: 9.3 (ref 8.7–10.7)

## 2020-07-27 LAB — PROTEIN, TOTAL: Total Protein: 8.4 g/dL — AB (ref 6.3–8.2)

## 2020-07-30 ENCOUNTER — Telehealth: Payer: Self-pay

## 2020-07-30 NOTE — Telephone Encounter (Signed)
-----   Message from Christine H McCarty, MD sent at 07/27/2020  6:55 PM EDT ----- Regarding: call pt Tell her labs are better, send copy to PCP and surgeon  

## 2020-08-02 ENCOUNTER — Telehealth: Payer: Self-pay

## 2020-08-02 NOTE — Telephone Encounter (Signed)
-----   Message from Derwood Kaplan, MD sent at 07/27/2020  6:55 PM EDT ----- Regarding: call pt Tell her labs are better, send copy to PCP and surgeon

## 2020-08-09 NOTE — Patient Instructions (Addendum)
DUE TO COVID-19 ONLY ONE VISITOR IS ALLOWED TO COME WITH YOU AND STAY IN THE WAITING ROOM ONLY DURING PRE OP AND PROCEDURE DAY OF SURGERY. THE 1 VISITOR  MAY VISIT WITH YOU AFTER SURGERY IN YOUR PRIVATE ROOM DURING VISITING HOURS ONLY!  YOU NEED TO HAVE A COVID 19 TEST ON_4/11______ @_9 :35______, THIS TEST MUST BE DONE BEFORE SURGERY,  COVID TESTING SITE [place] [address], IT IS ON THE RIGHT GOING OUT [address] APPROXIMATELY  2 MINUTES PAST [employer] ON THE RIGHT. ONCE YOUR COVID TEST IS COMPLETED,  PLEASE BEGIN THE QUARANTINE INSTRUCTIONS AS OUTLINED IN YOUR HANDOUT.                Tanya Mcclure    Your procedure is scheduled on: 08/22/20   Report to [facility] Main  Entrance   Report to admitting at  10:00 AM     Call this number if you have problems the morning of surgery [phone]   Follow all instructions from Dr. Manon Hilding office for the bowel prep.  Full Liquid Diet   Strained creamy soups Tea, Coffee- with cream or mild and sugar or honey  Juices- cranberry , grape and apple  Jello  Milkshakes  Pudding , custards  Popsicles  Water Plain ice cream f, frozen yogurt, sherbet, plain yogurt  Fruit ices and popsicles with no fruit pulp  Sugar, honey and syrups Clear broths  Boost, Ensure, Resource and other liquid supplements NO CARBONATED BEVERAGES     Drink plenty of liquids on the day of prep to prevent dehydration.     CLEAR LIQUID DIET   Foods Allowed                                                                     Foods Excluded  Coffee and tea, regular and decaf                             liquids that you cannot  Plain Jell-O any favor except red or purple                                           see through such as: Fruit ices (not with fruit pulp)                                     milk, soups, orange juice  Iced Popsicles                                    All solid food Carbonated beverages,  regular and diet                                    Cranberry, grape and apple juices Sports drinks like Gatorade Lightly seasoned clear broth or consume(fat free) Sugar, honey syrup  BRUSH YOUR TEETH MORNING OF SURGERY AND RINSE YOUR MOUTH OUT, NO CHEWING GUM CANDY OR MINTS.   DRINK 20 fluid OZ THE NIGHT BEFORE SURGERY AT 10:00 PM   NO SOLIDS AFTER MIDNIGHT THE DAY PRIOR TO THE SURGERY.  You may have clear liquids until 8:00 am  DRINK 10 OZ AT 8:00 AM then nothing more by mouth  How to Manage Your Diabetes Before and After Surgery  Why is it important to control my blood sugar before and after surgery? . Improving blood sugar levels before and after surgery helps healing and can limit problems. . A way of improving blood sugar control is eating a healthy diet by: o  Eating less sugar and carbohydrates o  Increasing activity/exercise o  Talking with your doctor about reaching your blood sugar goals . High blood sugars (greater than 180 mg/dL) can raise your risk of infections and slow your recovery, so you will need to focus on controlling your diabetes during the weeks before surgery. . Make sure that the doctor who takes care of your diabetes knows about your planned surgery including the date and location.  How do I manage my blood sugar before surgery? . Check your blood sugar at least 4 times a day, starting 2 days before surgery, to make sure that the level is not too high or low. o Check your blood sugar the morning of your surgery when you wake up and every 2 hours until you get to the Short Stay unit. . If your blood sugar is less than 70 mg/dL, you will need to treat for low blood sugar: o Do not take insulin. o Treat a low blood sugar (less than 70 mg/dL) with  cup of clear juice (cranberry or apple), 4 glucose tablets, OR glucose gel. o Recheck blood sugar in 15 minutes after treatment (to make sure it is greater than 70 mg/dL). If your blood sugar is not  greater than 70 mg/dL on recheck, call [phone] for further instructions. . Report your blood sugar to the short stay nurse when you get to Short Stay.  . If you are admitted to the hospital after surgery: o Your blood sugar will be checked by the staff and you will probably be given insulin after surgery (instead of oral diabetes medicines) to make sure you have good blood sugar levels. o The goal for blood sugar control after surgery is 80-180 mg/dL.   WHAT DO I DO ABOUT MY DIABETES MEDICATION?  Marland Kitchen Do not take oral diabetes medicines (pills) the morning of surgery.  . Dennis Bast may not have any metal on your body including hair pins and              piercings  Do not wear jewelry, make-up, lotions, powders or perfumes, deodorant             Do not wear nail polish on your fingernails.  Do not shave  48 hours prior to surgery.     Do not bring valuables to the hospital. [place].  Contacts, dentures or bridgework may not be worn into surgery.    _____________________________________________________________________  [facility] - Preparing for Surgery Before surgery, you can play an important role.  Because skin is not sterile, your skin needs to be as free of germs as possible.  You can reduce the number of germs on your skin by washing with CHG (chlorahexidine gluconate) soap before surgery.  CHG is an antiseptic cleaner which kills germs and bonds with the skin to continue killing germs even after washing. Please DO NOT use if you have an allergy to CHG or antibacterial soaps.  If your skin becomes reddened/irritated stop using the CHG and inform your nurse when you arrive at Short Stay. Do not shave (including legs and underarms) for at least 48 hours prior to the first CHG shower.   Please follow these instructions carefully:   1.  Shower with CHG Soap the night before surgery and the   morning of Surgery.  2.  If you choose to wash your hair, wash your hair first as usual with your  normal  shampoo.  3.  After you shampoo, rinse your hair and body thoroughly to remove the  shampoo.                                        4.  Use CHG as you would any other liquid soap.  You can apply chg directly  to the skin and wash                       Gently with a scrungie or clean washcloth.  5.  Apply the CHG Soap to your body ONLY FROM THE NECK DOWN.   Do not use on face/ open                           Wound or open sores. Avoid contact with eyes, ears mouth and genitals (private parts).                       Wash face,  Genitals (private parts) with your normal soap.             6.  Wash thoroughly, paying special attention to the area where your surgery  will be performed.  7.  Thoroughly rinse your body with warm water from the neck down.  8.  DO NOT shower/wash with your normal soap after using and rinsing off  the CHG Soap.             9.  Pat yourself dry with a clean towel.            10.  Wear clean pajamas.            11.  Place clean sheets on your bed the night of your first shower and do not  sleep with pets. Day of Surgery : Do not apply any lotions/deodorants the morning of surgery.  Please wear clean clothes to the hospital/surgery center.  FAILURE TO FOLLOW THESE INSTRUCTIONS MAY RESULT IN THE CANCELLATION OF YOUR SURGERY PATIENT SIGNATURE_________________________________  NURSE SIGNATURE__________________________________  ________________________________________________________________________   Adam Phenix  An incentive spirometer is a tool that can help keep your lungs clear and active. This tool measures how well you are filling your lungs with each breath. Taking long deep breaths may help reverse or decrease the chance of developing breathing (pulmonary) problems (especially infection)  following:  A long period of time when you are unable to move  or be active. BEFORE THE PROCEDURE   If the spirometer includes an indicator to show your best effort, your nurse or respiratory therapist will set it to a desired goal.  If possible, sit up straight or lean slightly forward. Try not to slouch.  Hold the incentive spirometer in an upright position. INSTRUCTIONS FOR USE  1. Sit on the edge of your bed if possible, or sit up as far as you can in bed or on a chair. 2. Hold the incentive spirometer in an upright position. 3. Breathe out normally. 4. Place the mouthpiece in your mouth and seal your lips tightly around it. 5. Breathe in slowly and as deeply as possible, raising the piston or the ball toward the top of the column. 6. Hold your breath for 3-5 seconds or for as long as possible. Allow the piston or ball to fall to the bottom of the column. 7. Remove the mouthpiece from your mouth and breathe out normally. 8. Rest for a few seconds and repeat Steps 1 through 7 at least 10 times every 1-2 hours when you are awake. Take your time and take a few normal breaths between deep breaths. 9. The spirometer may include an indicator to show your best effort. Use the indicator as a goal to work toward during each repetition. 10. After each set of 10 deep breaths, practice coughing to be sure your lungs are clear. If you have an incision (the cut made at the time of surgery), support your incision when coughing by placing a pillow or rolled up towels firmly against it. Once you are able to get out of bed, walk around indoors and cough well. You may stop using the incentive spirometer when instructed by your caregiver.  RISKS AND COMPLICATIONS  Take your time so you do not get dizzy or light-headed.  If you are in pain, you may need to take or ask for pain medication before doing incentive spirometry. It is harder to take a deep breath if you are having pain. AFTER USE  Rest and breathe slowly and easily.  It can be helpful to keep track of a  log of your progress. Your caregiver can provide you with a simple table to help with this. If you are using the spirometer at home, follow these instructions: [place] IF:   You are having difficultly using the spirometer.  You have trouble using the spirometer as often as instructed.  Your pain medication is not giving enough relief while using the spirometer.  You develop fever of 100.5 F (38.1 C) or higher. SEEK IMMEDIATE MEDICAL CARE IF:   You cough up bloody sputum that had not been present before.  You develop fever of 102 F (38.9 C) or greater.  You develop worsening pain at or near the incision site. MAKE SURE YOU:   Understand these instructions.  Will watch your condition.  Will get help right away if you are not doing well or get worse. Document Released: 09/08/2006 Document Revised: 07/21/2011 Document Reviewed: 11/09/2006 [facility] Patient Information 2014 [place], [place].   ________________________________________________________________________

## 2020-08-14 ENCOUNTER — Encounter (HOSPITAL_COMMUNITY)
Admission: RE | Admit: 2020-08-14 | Discharge: 2020-08-14 | Disposition: A | Discharge status: Home / Self Care | Payer: Medicaid Other | Source: Ambulatory Visit | Attending: General Surgery | Admitting: General Surgery

## 2020-08-14 ENCOUNTER — Other Ambulatory Visit: Payer: Self-pay

## 2020-08-14 ENCOUNTER — Encounter (HOSPITAL_COMMUNITY): Payer: Self-pay

## 2020-08-14 DIAGNOSIS — Z01818 Encounter for other preprocedural examination: Secondary | ICD-10-CM | POA: Insufficient documentation

## 2020-08-14 HISTORY — DX: Family history of other specified conditions: Z84.89

## 2020-08-14 HISTORY — DX: Dyspnea, unspecified: R06.00

## 2020-08-14 HISTORY — DX: Hypothyroidism, unspecified: E03.9

## 2020-08-14 HISTORY — DX: Gastro-esophageal reflux disease without esophagitis: K21.9

## 2020-08-14 LAB — BASIC METABOLIC PANEL
Anion gap: 9 (ref 5–15)
BUN: 12 mg/dL (ref 6–20)
CO2: 29 mmol/L (ref 22–32)
Calcium: 9.5 mg/dL (ref 8.9–10.3)
Chloride: 99 mmol/L (ref 98–111)
Creatinine, Ser: 0.69 mg/dL (ref 0.44–1.00)
GFR, Estimated: >60 mL/min (ref >60)
Glucose, Bld: 156 mg/dL — ABNORMAL HIGH (ref 70–99)
Potassium: 4 mmol/L (ref 3.5–5.1)
Sodium: 137 mmol/L (ref 135–145)

## 2020-08-14 LAB — GLUCOSE, CAPILLARY: Glucose-Capillary: 171 mg/dL — ABNORMAL HIGH (ref 70–99)

## 2020-08-14 LAB — CBC
HCT: 38.4 % (ref 36.0–46.0)
Hemoglobin: 12.8 g/dL (ref 12.0–15.0)
MCH: 29.1 pg (ref 26.0–34.0)
MCHC: 33.3 g/dL (ref 30.0–36.0)
MCV: 87.3 fL (ref 80.0–100.0)
Platelets: 310 10*3/uL (ref 150–400)
RBC: 4.4 MIL/uL (ref 3.87–5.11)
RDW: 13.9 % (ref 11.5–15.5)
WBC: 5.2 10*3/uL (ref 4.0–10.5)
nRBC: 0 % (ref 0.0–0.2)

## 2020-08-14 LAB — HEMOGLOBIN A1C
Hgb A1c MFr Bld: 8 % — ABNORMAL HIGH (ref 4.8–5.6)
Mean Plasma Glucose: 182.9 mg/dL

## 2020-08-14 NOTE — Progress Notes (Signed)
COVID Vaccine Completed:no Date COVID Vaccine completed: COVID vaccine manufacturer: [employer]   PCP - Denzil Magnuson Cardiologist - no  Chest x-ray - no EKG - 08/14/20-chart,epic Stress Test - no ECHO - no Cardiac Cath - NA Pacemaker/ICD device last checked:NA  Sleep Study - yes CPAP - no Pt had Wt loss  Fasting Blood Sugar - 130-200 Checks Blood Sugar _BID____ times a day  Blood Thinner Instructions:NA Aspirin Instructions: Last Dose:  Anesthesia review:   Patient denies shortness of breath, fever, cough and chest pain at PAT appointment yes  Patient verbalized understanding of instructions that were given to them at the PAT appointment. Patient was also instructed that they will need to review over the PAT instructions again at home before surgery. Yes Pt has asthma and uses inhalers but she gets SOB walking up an incline and climbing stairs. No SOB doing housework or with ADLs.

## 2020-08-14 NOTE — Consult Note (Addendum)
[place] Nurse requested for preoperative stoma site marking  Discussed surgical procedure and stoma creation with patient. Explained role of the [place] nurse team.  Provided the patient with educational booklet and provided samples of pouching options.  Answered patient's questions.   Examined patient sitting and standing in order to place the marking in the patient's visual field, away from any creases or abdominal contour issues and within the rectus muscle.  Attempted to mark below the patient's belt line. There is a significant crease located above and below the site markings which occurs when the patient leans forward and should be avoided if possible.   Marked for colostomy in the LLQ  _7___ cm to the left of the umbilicus and _3___XV below the umbilicus.  Marked for ileostomy in the RLQ  __7__cm to the right of the umbilicus and  __4__ cm below the umbilicus.  Patient's abdomen cleansed with CHG wipes at site markings, allowed to air dry prior to marking.Covered mark with thin film transparent dressing to preserve mark until date of surgery. Gave the patient a marking pen and instructed her to color in the sites if they begin to fade prior to surgery. [place] Nurse team will follow up with patient after surgery for continued ostomy care and teaching.  Julien Girt MSN, RN, [place], [place], [place]

## 2020-08-16 ENCOUNTER — Other Ambulatory Visit (HOSPITAL_COMMUNITY): Payer: Self-pay

## 2020-08-18 ENCOUNTER — Inpatient Hospital Stay (HOSPITAL_COMMUNITY): Admission: RE | Admit: 2020-08-18 | Payer: Medicaid Other | Source: Ambulatory Visit

## 2020-08-20 ENCOUNTER — Other Ambulatory Visit (HOSPITAL_COMMUNITY)
Admission: RE | Admit: 2020-08-20 | Discharge: 2020-08-20 | Disposition: A | Discharge status: Home / Self Care | Payer: Medicaid Other | Source: Ambulatory Visit | Attending: General Surgery | Admitting: General Surgery

## 2020-08-20 DIAGNOSIS — Z01812 Encounter for preprocedural laboratory examination: Secondary | ICD-10-CM | POA: Insufficient documentation

## 2020-08-20 DIAGNOSIS — Z20822 Contact with and (suspected) exposure to covid-19: Secondary | ICD-10-CM | POA: Insufficient documentation

## 2020-08-20 LAB — SARS CORONAVIRUS 2 (TAT 6-24 HRS): SARS Coronavirus 2: NEGATIVE

## 2020-08-21 ENCOUNTER — Encounter (HOSPITAL_COMMUNITY): Payer: Self-pay | Admitting: General Surgery

## 2020-08-21 MED ORDER — BUPIVACAINE LIPOSOME 1.3 % IJ SUSP
20.0000 mL | Freq: Once | INTRAMUSCULAR | Status: DC
  Filled 2020-08-21: qty 20

## 2020-08-22 ENCOUNTER — Encounter (HOSPITAL_COMMUNITY)
Admission: RE | Disposition: A | Discharge status: Home Health | Payer: Self-pay | Source: Home / Self Care | Attending: General Surgery

## 2020-08-22 ENCOUNTER — Encounter (HOSPITAL_COMMUNITY): Payer: Self-pay | Admitting: General Surgery

## 2020-08-22 ENCOUNTER — Inpatient Hospital Stay (HOSPITAL_COMMUNITY): Payer: Medicaid Other | Admitting: Anesthesiology

## 2020-08-22 ENCOUNTER — Inpatient Hospital Stay (HOSPITAL_COMMUNITY)
Admission: RE | Admit: 2020-08-22 | Discharge: 2020-08-25 | DRG: 334 | Disposition: A | Discharge status: Home Health | Payer: Medicaid Other | Attending: General Surgery | Admitting: General Surgery

## 2020-08-22 ENCOUNTER — Inpatient Hospital Stay (HOSPITAL_COMMUNITY): Payer: Medicaid Other | Admitting: Physician Assistant

## 2020-08-22 DIAGNOSIS — Z8261 Family history of arthritis: Secondary | ICD-10-CM | POA: Diagnosis not present

## 2020-08-22 DIAGNOSIS — Z923 Personal history of irradiation: Secondary | ICD-10-CM

## 2020-08-22 DIAGNOSIS — E1165 Type 2 diabetes mellitus with hyperglycemia: Secondary | ICD-10-CM | POA: Diagnosis not present

## 2020-08-22 DIAGNOSIS — Z7984 Long term (current) use of oral hypoglycemic drugs: Secondary | ICD-10-CM

## 2020-08-22 DIAGNOSIS — Z791 Long term (current) use of non-steroidal anti-inflammatories (NSAID): Secondary | ICD-10-CM

## 2020-08-22 DIAGNOSIS — Z79890 Hormone replacement therapy (postmenopausal): Secondary | ICD-10-CM

## 2020-08-22 DIAGNOSIS — E039 Hypothyroidism, unspecified: Secondary | ICD-10-CM | POA: Diagnosis present

## 2020-08-22 DIAGNOSIS — Z9221 Personal history of antineoplastic chemotherapy: Secondary | ICD-10-CM | POA: Diagnosis not present

## 2020-08-22 DIAGNOSIS — C20 Malignant neoplasm of rectum: Principal | ICD-10-CM | POA: Diagnosis present

## 2020-08-22 DIAGNOSIS — Z8249 Family history of ischemic heart disease and other diseases of the circulatory system: Secondary | ICD-10-CM | POA: Diagnosis not present

## 2020-08-22 DIAGNOSIS — E7800 Pure hypercholesterolemia, unspecified: Secondary | ICD-10-CM | POA: Diagnosis present

## 2020-08-22 DIAGNOSIS — Z833 Family history of diabetes mellitus: Secondary | ICD-10-CM

## 2020-08-22 DIAGNOSIS — Z7982 Long term (current) use of aspirin: Secondary | ICD-10-CM

## 2020-08-22 DIAGNOSIS — K219 Gastro-esophageal reflux disease without esophagitis: Secondary | ICD-10-CM | POA: Diagnosis present

## 2020-08-22 DIAGNOSIS — Z880 Allergy status to penicillin: Secondary | ICD-10-CM | POA: Diagnosis not present

## 2020-08-22 DIAGNOSIS — Z79899 Other long term (current) drug therapy: Secondary | ICD-10-CM

## 2020-08-22 HISTORY — PX: XI ROBOTIC ASSISTED LOWER ANTERIOR RESECTION: SHX6558

## 2020-08-22 LAB — GLUCOSE, CAPILLARY
Glucose-Capillary: 133 mg/dL — ABNORMAL HIGH (ref 70–99)
Glucose-Capillary: 253 mg/dL — ABNORMAL HIGH (ref 70–99)
Glucose-Capillary: 199 mg/dL — ABNORMAL HIGH (ref 70–99)

## 2020-08-22 LAB — TYPE AND SCREEN
ABO/RH(D): A POS
Antibody Screen: NEGATIVE

## 2020-08-22 LAB — ABO/RH: ABO/RH(D): A POS

## 2020-08-22 SURGERY — RESECTION, RECTUM, LOW ANTERIOR, ROBOT-ASSISTED
Anesthesia: General | Site: Abdomen

## 2020-08-22 MED ORDER — LIDOCAINE 2% (20 MG/ML) 5 ML SYRINGE
INTRAMUSCULAR | Status: AC
  Filled 2020-08-22: qty 5

## 2020-08-22 MED ORDER — ENSURE SURGERY PO LIQD
237.0000 mL | Freq: Two times a day (BID) | ORAL | Status: DC
  Administered 2020-08-23 – 2020-08-24 (×3): 237 mL via ORAL

## 2020-08-22 MED ORDER — EPHEDRINE SULFATE-NACL 50-0.9 MG/10ML-% IV SOSY
PREFILLED_SYRINGE | INTRAVENOUS | Status: DC | PRN
  Administered 2020-08-22: 10 mg via INTRAVENOUS
  Administered 2020-08-22: 15 mg via INTRAVENOUS
  Administered 2020-08-22 (×2): 10 mg via INTRAVENOUS

## 2020-08-22 MED ORDER — ALVIMOPAN 12 MG PO CAPS
12.0000 mg | ORAL_CAPSULE | ORAL | Status: AC
  Administered 2020-08-22: 12 mg via ORAL
  Filled 2020-08-22: qty 1

## 2020-08-22 MED ORDER — ALUM & MAG HYDROXIDE-SIMETH 200-200-20 MG/5ML PO SUSP: 30.0000 mL | Freq: Four times a day (QID) | ORAL | Status: DC | PRN

## 2020-08-22 MED ORDER — METFORMIN HCL ER 500 MG PO TB24
500.0000 mg | ORAL_TABLET | Freq: Two times a day (BID) | ORAL | Status: DC
  Administered 2020-08-24 – 2020-08-25 (×2): 500 mg via ORAL
  Filled 2020-08-22 (×3): qty 1

## 2020-08-22 MED ORDER — PROMETHAZINE HCL 25 MG/ML IJ SOLN: 6.2500 mg | INTRAMUSCULAR | Status: DC | PRN

## 2020-08-22 MED ORDER — EPHEDRINE 5 MG/ML INJ
INTRAVENOUS | Status: AC
  Filled 2020-08-22: qty 10

## 2020-08-22 MED ORDER — ACETAMINOPHEN 500 MG PO TABS
1000.0000 mg | ORAL_TABLET | ORAL | Status: AC
  Administered 2020-08-22: 1000 mg via ORAL
  Filled 2020-08-22: qty 2

## 2020-08-22 MED ORDER — LEVOTHYROXINE SODIUM 100 MCG PO TABS
100.0000 ug | ORAL_TABLET | Freq: Every day | ORAL | Status: DC
  Administered 2020-08-23 – 2020-08-25 (×3): 100 ug via ORAL
  Filled 2020-08-22 (×3): qty 1

## 2020-08-22 MED ORDER — KETAMINE HCL 10 MG/ML IJ SOLN
INTRAMUSCULAR | Status: AC
  Filled 2020-08-22: qty 1

## 2020-08-22 MED ORDER — HYDROCODONE-ACETAMINOPHEN 7.5-325 MG PO TABS
1.0000 | ORAL_TABLET | Freq: Four times a day (QID) | ORAL | Status: DC | PRN
Start: 2020-08-22 — End: 2020-08-24
  Administered 2020-08-23 (×2): 1 via ORAL
  Filled 2020-08-22 (×2): qty 1

## 2020-08-22 MED ORDER — LORATADINE 10 MG PO TABS
10.0000 mg | ORAL_TABLET | Freq: Every day | ORAL | Status: DC
  Administered 2020-08-23 – 2020-08-25 (×3): 10 mg via ORAL
  Filled 2020-08-22 (×3): qty 1

## 2020-08-22 MED ORDER — BUPIVACAINE-EPINEPHRINE 0.25% -1:200000 IJ SOLN
INTRAMUSCULAR | Status: DC | PRN
  Administered 2020-08-22: 30 mL

## 2020-08-22 MED ORDER — MONTELUKAST SODIUM 10 MG PO TABS
10.0000 mg | ORAL_TABLET | Freq: Every day | ORAL | Status: DC
  Administered 2020-08-23 – 2020-08-25 (×3): 10 mg via ORAL
  Filled 2020-08-22 (×3): qty 1

## 2020-08-22 MED ORDER — BUPIVACAINE LIPOSOME 1.3 % IJ SUSP
INTRAMUSCULAR | Status: DC | PRN
Start: 2020-08-22 — End: 2020-08-22
  Administered 2020-08-22: 20 mL

## 2020-08-22 MED ORDER — FENTANYL CITRATE (PF) 100 MCG/2ML IJ SOLN
INTRAMUSCULAR | Status: AC
  Filled 2020-08-22: qty 2

## 2020-08-22 MED ORDER — DIPHENHYDRAMINE HCL 50 MG/ML IJ SOLN: 12.5000 mg | Freq: Four times a day (QID) | INTRAMUSCULAR | Status: DC | PRN

## 2020-08-22 MED ORDER — GABAPENTIN 300 MG PO CAPS
300.0000 mg | ORAL_CAPSULE | Freq: Two times a day (BID) | ORAL | Status: DC
  Administered 2020-08-22 – 2020-08-25 (×6): 300 mg via ORAL
  Filled 2020-08-22 (×6): qty 1

## 2020-08-22 MED ORDER — ALBUTEROL SULFATE (2.5 MG/3ML) 0.083% IN NEBU: 2.5000 mg | INHALATION_SOLUTION | Freq: Four times a day (QID) | RESPIRATORY_TRACT | Status: DC | PRN

## 2020-08-22 MED ORDER — ONDANSETRON HCL 4 MG/2ML IJ SOLN
INTRAMUSCULAR | Status: AC
  Filled 2020-08-22: qty 2

## 2020-08-22 MED ORDER — ENSURE PRE-SURGERY PO LIQD: 592.0000 mL | Freq: Once | ORAL | Status: DC

## 2020-08-22 MED ORDER — LIDOCAINE HCL 2 % IJ SOLN
INTRAMUSCULAR | Status: AC
  Filled 2020-08-22: qty 20

## 2020-08-22 MED ORDER — SIMETHICONE 80 MG PO CHEW
40.0000 mg | CHEWABLE_TABLET | Freq: Four times a day (QID) | ORAL | Status: DC | PRN
  Administered 2020-08-24: 40 mg via ORAL
  Filled 2020-08-22: qty 1

## 2020-08-22 MED ORDER — PROPOFOL 1000 MG/100ML IV EMUL
INTRAVENOUS | Status: AC
  Filled 2020-08-22: qty 100

## 2020-08-22 MED ORDER — HEPARIN SODIUM (PORCINE) 5000 UNIT/ML IJ SOLN
5000.0000 [IU] | Freq: Once | INTRAMUSCULAR | Status: AC
  Administered 2020-08-22: 5000 [IU] via SUBCUTANEOUS
  Filled 2020-08-22: qty 1

## 2020-08-22 MED ORDER — ALVIMOPAN 12 MG PO CAPS
12.0000 mg | ORAL_CAPSULE | Freq: Two times a day (BID) | ORAL | Status: DC
  Administered 2020-08-23 – 2020-08-24 (×3): 12 mg via ORAL
  Filled 2020-08-22 (×3): qty 1

## 2020-08-22 MED ORDER — ONDANSETRON HCL 4 MG/2ML IJ SOLN: 4.0000 mg | Freq: Four times a day (QID) | INTRAMUSCULAR | Status: DC | PRN

## 2020-08-22 MED ORDER — LIDOCAINE 2% (20 MG/ML) 5 ML SYRINGE
INTRAMUSCULAR | Status: DC | PRN
  Administered 2020-08-22: 80 mg via INTRAVENOUS

## 2020-08-22 MED ORDER — GABAPENTIN 300 MG PO CAPS
300.0000 mg | ORAL_CAPSULE | ORAL | Status: AC
  Administered 2020-08-22: 300 mg via ORAL
  Filled 2020-08-22: qty 1

## 2020-08-22 MED ORDER — FENTANYL CITRATE (PF) 100 MCG/2ML IJ SOLN: 25.0000 ug | INTRAMUSCULAR | Status: DC | PRN

## 2020-08-22 MED ORDER — POTASSIUM CHLORIDE CRYS ER 20 MEQ PO TBCR
20.0000 meq | EXTENDED_RELEASE_TABLET | Freq: Two times a day (BID) | ORAL | Status: DC
  Administered 2020-08-22 – 2020-08-25 (×6): 20 meq via ORAL
  Filled 2020-08-22 (×6): qty 1

## 2020-08-22 MED ORDER — PROPOFOL 10 MG/ML IV BOLUS
INTRAVENOUS | Status: DC | PRN
  Administered 2020-08-22: 25 ug/kg/min via INTRAVENOUS
  Administered 2020-08-22: 180 mg via INTRAVENOUS

## 2020-08-22 MED ORDER — ROCURONIUM BROMIDE 10 MG/ML (PF) SYRINGE
PREFILLED_SYRINGE | INTRAVENOUS | Status: DC | PRN
  Administered 2020-08-22 (×2): 20 mg via INTRAVENOUS
  Administered 2020-08-22: 60 mg via INTRAVENOUS

## 2020-08-22 MED ORDER — ACETAMINOPHEN 500 MG PO TABS
1000.0000 mg | ORAL_TABLET | Freq: Four times a day (QID) | ORAL | Status: DC
  Administered 2020-08-22 – 2020-08-25 (×8): 1000 mg via ORAL
  Filled 2020-08-22 (×10): qty 2

## 2020-08-22 MED ORDER — HYDROMORPHONE HCL 1 MG/ML IJ SOLN
0.5000 mg | INTRAMUSCULAR | Status: DC | PRN
  Administered 2020-08-22 – 2020-08-24 (×4): 0.5 mg via INTRAVENOUS
  Filled 2020-08-22 (×4): qty 0.5

## 2020-08-22 MED ORDER — LACTATED RINGERS IR SOLN
Status: DC | PRN
  Administered 2020-08-22: 1000 mL

## 2020-08-22 MED ORDER — DEXAMETHASONE SODIUM PHOSPHATE 10 MG/ML IJ SOLN
INTRAMUSCULAR | Status: AC
  Filled 2020-08-22: qty 1

## 2020-08-22 MED ORDER — INSULIN ASPART 100 UNIT/ML ~~LOC~~ SOLN
SUBCUTANEOUS | Status: AC
  Administered 2020-08-22: 11 [IU] via SUBCUTANEOUS
  Filled 2020-08-22: qty 1

## 2020-08-22 MED ORDER — PROPOFOL 10 MG/ML IV BOLUS
INTRAVENOUS | Status: AC
  Filled 2020-08-22: qty 40

## 2020-08-22 MED ORDER — ENOXAPARIN SODIUM 40 MG/0.4ML ~~LOC~~ SOLN
40.0000 mg | SUBCUTANEOUS | Status: DC
  Administered 2020-08-23 – 2020-08-25 (×3): 40 mg via SUBCUTANEOUS
  Filled 2020-08-22 (×3): qty 0.4

## 2020-08-22 MED ORDER — SODIUM CHLORIDE 0.9 % IV SOLN
2.0000 g | INTRAVENOUS | Status: AC
  Administered 2020-08-22: 2 g via INTRAVENOUS
  Filled 2020-08-22: qty 2

## 2020-08-22 MED ORDER — ONDANSETRON HCL 4 MG PO TABS
4.0000 mg | ORAL_TABLET | Freq: Four times a day (QID) | ORAL | Status: DC | PRN
Start: 2020-08-22 — End: 2020-08-25

## 2020-08-22 MED ORDER — KETAMINE HCL 10 MG/ML IJ SOLN
INTRAMUSCULAR | Status: DC | PRN
  Administered 2020-08-22: 40 mg via INTRAVENOUS

## 2020-08-22 MED ORDER — ORAL CARE MOUTH RINSE: 15.0000 mL | Freq: Once | OROMUCOSAL | Status: AC

## 2020-08-22 MED ORDER — FUROSEMIDE 20 MG PO TABS
20.0000 mg | ORAL_TABLET | Freq: Every day | ORAL | Status: DC
  Administered 2020-08-23 – 2020-08-25 (×3): 20 mg via ORAL
  Filled 2020-08-22 (×3): qty 1

## 2020-08-22 MED ORDER — ROCURONIUM BROMIDE 10 MG/ML (PF) SYRINGE
PREFILLED_SYRINGE | INTRAVENOUS | Status: AC
  Filled 2020-08-22: qty 10

## 2020-08-22 MED ORDER — INSULIN ASPART 100 UNIT/ML ~~LOC~~ SOLN
0.0000 [IU] | Freq: Three times a day (TID) | SUBCUTANEOUS | Status: DC
  Administered 2020-08-23 – 2020-08-24 (×4): 4 [IU] via SUBCUTANEOUS
  Administered 2020-08-24: 3 [IU] via SUBCUTANEOUS
  Administered 2020-08-24 – 2020-08-25 (×2): 4 [IU] via SUBCUTANEOUS

## 2020-08-22 MED ORDER — 0.9 % SODIUM CHLORIDE (POUR BTL) OPTIME
TOPICAL | Status: DC | PRN
  Administered 2020-08-22: 2000 mL

## 2020-08-22 MED ORDER — DEXAMETHASONE SODIUM PHOSPHATE 10 MG/ML IJ SOLN
INTRAMUSCULAR | Status: DC | PRN
  Administered 2020-08-22: 10 mg via INTRAVENOUS

## 2020-08-22 MED ORDER — MIDAZOLAM HCL 2 MG/2ML IJ SOLN
INTRAMUSCULAR | Status: AC
  Filled 2020-08-22: qty 2

## 2020-08-22 MED ORDER — SUGAMMADEX SODIUM 200 MG/2ML IV SOLN
INTRAVENOUS | Status: DC | PRN
  Administered 2020-08-22: 200 mg via INTRAVENOUS

## 2020-08-22 MED ORDER — KCL IN DEXTROSE-NACL 20-5-0.45 MEQ/L-%-% IV SOLN
INTRAVENOUS | Status: DC
  Filled 2020-08-22 (×2): qty 1000

## 2020-08-22 MED ORDER — PANTOPRAZOLE SODIUM 40 MG PO TBEC
40.0000 mg | DELAYED_RELEASE_TABLET | Freq: Two times a day (BID) | ORAL | Status: DC
  Administered 2020-08-22 – 2020-08-25 (×6): 40 mg via ORAL
  Filled 2020-08-22 (×6): qty 1

## 2020-08-22 MED ORDER — ENSURE PRE-SURGERY PO LIQD: 296.0000 mL | Freq: Once | ORAL | Status: DC

## 2020-08-22 MED ORDER — SACCHAROMYCES BOULARDII 250 MG PO CAPS
250.0000 mg | ORAL_CAPSULE | Freq: Two times a day (BID) | ORAL | Status: DC
  Administered 2020-08-22 – 2020-08-25 (×6): 250 mg via ORAL
  Filled 2020-08-22 (×6): qty 1

## 2020-08-22 MED ORDER — MIDAZOLAM HCL 5 MG/5ML IJ SOLN
INTRAMUSCULAR | Status: DC | PRN
  Administered 2020-08-22: 2 mg via INTRAVENOUS

## 2020-08-22 MED ORDER — CHLORHEXIDINE GLUCONATE 0.12 % MT SOLN
15.0000 mL | Freq: Once | OROMUCOSAL | Status: AC
  Administered 2020-08-22: 15 mL via OROMUCOSAL

## 2020-08-22 MED ORDER — DIPHENHYDRAMINE HCL 12.5 MG/5ML PO ELIX: 12.5000 mg | ORAL_SOLUTION | Freq: Four times a day (QID) | ORAL | Status: DC | PRN

## 2020-08-22 MED ORDER — ONDANSETRON HCL 4 MG/2ML IJ SOLN
INTRAMUSCULAR | Status: DC | PRN
  Administered 2020-08-22: 4 mg via INTRAVENOUS

## 2020-08-22 MED ORDER — LACTATED RINGERS IV SOLN: INTRAVENOUS | Status: DC

## 2020-08-22 MED ORDER — LACTATED RINGERS IV SOLN: INTRAVENOUS | Status: DC | PRN

## 2020-08-22 MED ORDER — INSULIN ASPART 100 UNIT/ML ~~LOC~~ SOLN
0.0000 [IU] | Freq: Every day | SUBCUTANEOUS | Status: DC
  Administered 2020-08-24: 2 [IU] via SUBCUTANEOUS

## 2020-08-22 MED ORDER — FENTANYL CITRATE (PF) 100 MCG/2ML IJ SOLN
INTRAMUSCULAR | Status: DC | PRN
  Administered 2020-08-22 (×2): 100 ug via INTRAVENOUS
  Administered 2020-08-22: 50 ug via INTRAVENOUS

## 2020-08-22 MED ORDER — BUPIVACAINE-EPINEPHRINE (PF) 0.25% -1:200000 IJ SOLN
INTRAMUSCULAR | Status: AC
  Filled 2020-08-22: qty 30

## 2020-08-22 MED ORDER — PROPOFOL 500 MG/50ML IV EMUL
INTRAVENOUS | Status: AC
  Filled 2020-08-22: qty 50

## 2020-08-22 MED ORDER — FENTANYL CITRATE (PF) 250 MCG/5ML IJ SOLN
INTRAMUSCULAR | Status: AC
  Filled 2020-08-22: qty 5

## 2020-08-22 SURGICAL SUPPLY — 88 items
BLADE EXTENDED COATED 6.5IN (ELECTRODE) IMPLANT
CANNULA REDUC XI 12-8 STAPL (CANNULA)
CANNULA REDUC XI 12-8MM STAPL (CANNULA)
CANNULA REDUCER 12-8 DVNC XI (CANNULA) IMPLANT
CELLS DAT CNTRL 66122 CELL SVR (MISCELLANEOUS) IMPLANT
COVER SURGICAL LIGHT HANDLE (MISCELLANEOUS) ×6 IMPLANT
COVER TIP SHEARS 8 DVNC (MISCELLANEOUS) ×1 IMPLANT
COVER TIP SHEARS 8MM DA VINCI (MISCELLANEOUS) ×3
COVER WAND RF STERILE (DRAPES) ×3 IMPLANT
DECANTER SPIKE VIAL GLASS SM (MISCELLANEOUS) IMPLANT
DRAIN CHANNEL 19F RND (DRAIN) ×3 IMPLANT
DRAPE ARM DVNC X/XI (DISPOSABLE) ×4 IMPLANT
DRAPE COLUMN DVNC XI (DISPOSABLE) ×1 IMPLANT
DRAPE DA VINCI XI ARM (DISPOSABLE) ×12
DRAPE DA VINCI XI COLUMN (DISPOSABLE) ×3
DRAPE SURG IRRIG POUCH 19X23 (DRAPES) ×3 IMPLANT
DRSG OPSITE POSTOP 4X10 (GAUZE/BANDAGES/DRESSINGS) IMPLANT
DRSG OPSITE POSTOP 4X6 (GAUZE/BANDAGES/DRESSINGS) IMPLANT
DRSG OPSITE POSTOP 4X8 (GAUZE/BANDAGES/DRESSINGS) IMPLANT
ELECT PENCIL ROCKER SW 15FT (MISCELLANEOUS) ×3 IMPLANT
ELECT REM PT RETURN 15FT ADLT (MISCELLANEOUS) ×3 IMPLANT
ENDOLOOP SUT PDS II  0 18 (SUTURE)
ENDOLOOP SUT PDS II 0 18 (SUTURE) IMPLANT
EVACUATOR SILICONE 100CC (DRAIN) ×3 IMPLANT
GLOVE SURG ENC MOIS LTX SZ6.5 (GLOVE) ×9 IMPLANT
GLOVE SURG UNDER POLY LF SZ7 (GLOVE) ×6 IMPLANT
GOWN STRL REUS W/TWL XL LVL3 (GOWN DISPOSABLE) ×9 IMPLANT
GRASPER SUT TROCAR 14GX15 (MISCELLANEOUS) IMPLANT
HOLDER FOLEY CATH W/STRAP (MISCELLANEOUS) ×3 IMPLANT
IRRIG SUCT STRYKERFLOW 2 WTIP (MISCELLANEOUS) ×3
IRRIGATION SUCT STRKRFLW 2 WTP (MISCELLANEOUS) ×1 IMPLANT
KIT PROCEDURE DA VINCI SI (MISCELLANEOUS)
KIT PROCEDURE DVNC SI (MISCELLANEOUS) IMPLANT
KIT TURNOVER KIT A (KITS) ×3 IMPLANT
NEEDLE INSUFFLATION 14GA 120MM (NEEDLE) ×3 IMPLANT
PACK CARDIOVASCULAR III (CUSTOM PROCEDURE TRAY) ×3 IMPLANT
PACK COLON (CUSTOM PROCEDURE TRAY) ×3 IMPLANT
PAD POSITIONING PINK XL (MISCELLANEOUS) ×3 IMPLANT
PORT LAP GEL ALEXIS MED 5-9CM (MISCELLANEOUS) IMPLANT
RELOAD STAPLER 3.5X60 BLU DVNC (STAPLE) ×1 IMPLANT
RELOAD STAPLER 4.3X60 GRN DVNC (STAPLE) IMPLANT
RTRCTR WOUND ALEXIS 18CM MED (MISCELLANEOUS)
SCISSORS LAP 5X35 DISP (ENDOMECHANICALS) IMPLANT
SEAL CANN UNIV 5-8 DVNC XI (MISCELLANEOUS) ×3 IMPLANT
SEAL XI 5MM-8MM UNIVERSAL (MISCELLANEOUS) ×9
SEALER VESSEL DA VINCI XI (MISCELLANEOUS) ×3
SEALER VESSEL EXT DVNC XI (MISCELLANEOUS) ×1 IMPLANT
SOLUTION ELECTROLUBE (MISCELLANEOUS) ×3 IMPLANT
STAPLER 60 DA VINCI SURE FORM (STAPLE) ×3
STAPLER 60 SUREFORM DVNC (STAPLE) ×1 IMPLANT
STAPLER CANNULA SEAL DVNC XI (STAPLE) IMPLANT
STAPLER CANNULA SEAL XI (STAPLE)
STAPLER ECHELON POWER CIR 29 (STAPLE) IMPLANT
STAPLER ECHELON POWER CIR 31 (STAPLE) IMPLANT
STAPLER RELOAD 3.5X60 BLU DVNC (STAPLE) ×1
STAPLER RELOAD 3.5X60 BLUE (STAPLE) ×3
STAPLER RELOAD 4.3X60 GREEN (STAPLE)
STAPLER RELOAD 4.3X60 GRN DVNC (STAPLE)
STOPCOCK 4 WAY LG BORE MALE ST (IV SETS) ×6 IMPLANT
SUT ETHILON 2 0 PS N (SUTURE) IMPLANT
SUT NOVA NAB DX-16 0-1 5-0 T12 (SUTURE) ×6 IMPLANT
SUT PROLENE 2 0 KS (SUTURE) IMPLANT
SUT SILK 2 0 (SUTURE) ×3
SUT SILK 2 0 SH CR/8 (SUTURE) IMPLANT
SUT SILK 2-0 18XBRD TIE 12 (SUTURE) ×1 IMPLANT
SUT SILK 3 0 (SUTURE)
SUT SILK 3 0 SH CR/8 (SUTURE) ×3 IMPLANT
SUT SILK 3-0 18XBRD TIE 12 (SUTURE) IMPLANT
SUT V-LOC BARB 180 2/0GR6 GS22 (SUTURE)
SUT VIC AB 2-0 SH 18 (SUTURE) ×9 IMPLANT
SUT VIC AB 2-0 SH 27 (SUTURE)
SUT VIC AB 2-0 SH 27X BRD (SUTURE) IMPLANT
SUT VIC AB 3-0 SH 18 (SUTURE) IMPLANT
SUT VIC AB 4-0 PS2 27 (SUTURE) ×6 IMPLANT
SUT VICRYL 0 UR6 27IN ABS (SUTURE) ×3 IMPLANT
SUT VLOC 180 0 9IN  GS21 (SUTURE) ×3
SUT VLOC 180 0 9IN GS21 (SUTURE) ×1 IMPLANT
SUTURE V-LC BRB 180 2/0GR6GS22 (SUTURE) IMPLANT
SYR 10ML ECCENTRIC (SYRINGE) ×3 IMPLANT
SYS LAPSCP GELPORT 120MM (MISCELLANEOUS)
SYSTEM LAPSCP GELPORT 120MM (MISCELLANEOUS) IMPLANT
TOWEL OR 17X26 10 PK STRL BLUE (TOWEL DISPOSABLE) IMPLANT
TOWEL OR NON WOVEN STRL DISP B (DISPOSABLE) ×3 IMPLANT
TRAY FOLEY MTR SLVR 16FR STAT (SET/KITS/TRAYS/PACK) ×3 IMPLANT
TROCAR ADV FIXATION 5X100MM (TROCAR) ×3 IMPLANT
TUBING CONNECTING 10 (TUBING) ×4 IMPLANT
TUBING CONNECTING 10' (TUBING) ×2
TUBING INSUFFLATION 10FT LAP (TUBING) ×3 IMPLANT

## 2020-08-22 NOTE — Transfer of Care (Signed)
Immediate Anesthesia Transfer of Care Note  Patient: Tanya Mcclure  Procedure(s) Performed: XI ROBOTIC ABDOMINAL PERIITONEAL RESECTION (N/A Abdomen)  Patient Location: PACU  Anesthesia Type:General  Level of Consciousness: awake and drowsy  Airway & Oxygen Therapy: Patient Spontanous Breathing and Patient connected to face mask oxygen  Post-op Assessment: Report given to RN and Post -op Vital signs reviewed and stable  Post vital signs: Reviewed and stable  Last Vitals:  Vitals Value Taken Time  BP 133/76 08/22/20 1600  Temp    Pulse 89 08/22/20 1601  Resp 14 08/22/20 1601  SpO2 100 % 08/22/20 1601  Vitals shown include unvalidated device data.  Last Pain:  Vitals:   08/22/20 1551  TempSrc:   PainSc: 0-No pain      Patients Stated Pain Goal: 3 (15/18/34 3735)  Complications: No complications documented.

## 2020-08-22 NOTE — Anesthesia Procedure Notes (Signed)
Procedure Name: Intubation Date/Time: 08/22/2020 12:06 PM Performed by: Lavina Hamman, CRNA Pre-anesthesia Checklist: Patient identified, Emergency Drugs available, Suction available, Patient being monitored and Timeout performed Patient Re-evaluated:Patient Re-evaluated prior to induction Oxygen Delivery Method: Circle system utilized Preoxygenation: Pre-oxygenation with 100% oxygen Induction Type: IV induction Ventilation: Mask ventilation without difficulty Laryngoscope Size: Mac and 3 Grade View: Grade I Tube type: Oral Tube size: 7.5 mm Number of attempts: 1 Airway Equipment and Method: Stylet Placement Confirmation: ETT inserted through vocal cords under direct vision,  positive ETCO2,  CO2 detector and breath sounds checked- equal and bilateral Secured at: 21 cm Tube secured with: Tape Dental Injury: Teeth and Oropharynx as per pre-operative assessment  Comments: ATOI, very poor dentition.  Teeth as in preop, unchanged.

## 2020-08-22 NOTE — Op Note (Signed)
08/22/2020  3:28 PM  PATIENT:  Tanya Mcclure  60 y.o. female  Patient Care Team: Little Ishikawa, MD as PCP - General  PRE-OPERATIVE DIAGNOSIS:  distal rectal cancer  POST-OPERATIVE DIAGNOSIS:  distal rectal cancer  PROCEDURE:  XI ROBOTIC ABDOMINAL PERIITONEAL RESECTION    Surgeon(s): Leighton Ruff, MD Michael Boston, MD  ASSISTANT: Dr Johney [place]   ANESTHESIA:   local and general  EBL: 100 ml  Total I/O In: -  Out: 100 [Urine:100]  Delay start of Pharmacological VTE agent (>24hrs) due to surgical blood loss or risk of bleeding:  no  DRAINS: (48F) Jackson-Pratt drain(s) with closed bulb suction in the pelvis   SPECIMEN:  Source of Specimen:  Sigmoid, rectum and anus  DISPOSITION OF SPECIMEN:  PATHOLOGY  COUNTS:  YES  PLAN OF CARE: Admit to inpatient   PATIENT DISPOSITION:  PACU - hemodynamically stable.  INDICATION:    60 y.o. 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  I recommended abdominal perineal resection:  The anatomy & physiology of the digestive tract was discussed.  The pathophysiology was discussed.  Natural history risks without surgery was discussed.   I worked to give an overview of the disease and the frequent need to have multispecialty involvement.  I feel the risks of no intervention will lead to serious problems that outweigh the operative risks; therefore, I recommended a partial colectomy to remove the pathology.  Laparoscopic & open techniques were discussed.   Risks such as bleeding, infection, abscess, leak, reoperation, possible ostomy, hernia, heart attack, death, and other risks were discussed.  I noted a good likelihood this will help address the problem.   Goals of post-operative recovery were discussed as well.    The patient expressed understanding & wished to proceed with surgery.  OR FINDINGS:   Patient had L posterior distal rectal cancer afixed to the pelvic floor and sphincter  complex  No obvious metastatic disease on visceral parietal peritoneum or liver.   DESCRIPTION:   Informed consent was confirmed.  The patient underwent general anaesthesia without difficulty.  The patient was positioned appropriately.  VTE prevention in place.  The patient's abdomen was clipped, prepped, & draped in a sterile fashion.  Surgical timeout confirmed our plan.  The patient was positioned in reverse Trendelenburg.  Abdominal entry was gained using a varies needle in the left upper quadrant.  Entry was clean.  I induced carbon dioxide insufflation.  An 69mm robotic port was placed in the right upper quadrant.  Camera inspection revealed no injury.  Extra ports were carefully placed under direct laparoscopic visualization.  I laparoscopically reflected the greater omentum and the upper abdomen the small bowel in the upper abdomen. The patient was appropriately positioned and the robot was docked to the patient's left side.  Instruments were placed under direct visualization.    I mobilized the sigmoid colon off of the pelvic sidewall.  I scored the base of peritoneum of the right side of the mesentery of the left colon from the ligament of Treitz to the peritoneal reflection of the mid rectum.  The patient had a loop of small intestines adherent to the distal sigmoid colon.  This was taken down using blunt dissection and the vessel sealer device.  The small bowel was inspected and no injury was noted.  This was then placed in the right lower quadrant.   I elevated the sigmoid mesentery and enetered into the retro-mesenteric plane. We were able to  identify the left ureter and gonadal vessels. We kept those posterior within the retroperitoneum and elevated the left colon mesentery off that. I did isolated IMA pedicle but did not ligate it yet.  I continued distally and got into the avascular plane posterior to the mesorectum. This allowed me to help mobilize the rectum as well by freeing the  mesorectum off the sacrum.  I mobilized the peritoneal coverings towards the peritoneal reflection on both the right and left sides of the rectum.  I could see the right and left ureters and stayed away from them.   I skeletonized the inferior mesenteric artery pedicle.  I went down to its takeoff from the aorta.  After confirming the left ureter was out of the way, I went ahead and ligated the inferior mesenteric artery pedicle with bipolar robotic vessel sealer ~2cm above its takeoff from the aorta.   We ensured hemostasis. I mobilized the left colon in a lateral to medial fashion off the line of Toldt up towards the splenic flexure to ensure good mobilization of the left colon to reach the abdominal wall.  Next I divided the mesentery just lateral to the divided mesenteric vessels.  I then divided the sigmoid colon using a robotic blue load stapler.  I then entered into the mesorectal plane.  I continued my dissection posteriorly.  I used the robotic vessel sealer to divide the lateral stalks.  I then divided the remaining peritoneum to the level of the peritoneal reflection.  I then bluntly dissected out the rectovaginal plane.  I continue my anterior dissection to the right lateral mesorectal plane.  I connected the posterior planes.  I continued down on the left side anteriorly to the level of the tumor.  At this point I entered into the pelvic floor lateral to the tumor site using the robotic vessel sealer.  A portion of pelvic floor was divided and this was continued down into the ischio rectal fossa.  I continue my dissection of the pelvic floor down to the level of the coccyx.  I then was able to enter my previously dissected posterior plane.  Once I was around the distal rectum circumferentially, I switched to my perineal dissection.  I made an incision around the anal canal using Bovie electrocautery.  A [place] device was used for retraction.  I continue my dissection around the anal canal  posteriorly until I was able to enter into the plane developed robotically.  I then divided the remainder of the pelvic floor on the right side.  Lastly, I finished my anterior dissection making sure to clear the vaginal wall completely.  My specimen was then removed from the pelvis and sent to pathology for further examination.  The pelvic floor was then closed using interrupted 2-0 Vicryl sutures in multiple layers.  I closed the subcutaneous tissue in multiple layers of interrupted 2-0 Vicryl sutures as well.  The dermal layer was then closed using interrupted 2-0 Vicryl sutures.  A sterile dressing was applied.  At this point the abdomen was reinsufflated and the robot was docked.  The peritoneum of the pelvis was closed using a running 2 OV lock suture.  The omentum was brought down and secured to this V-Loc suture to completely close off the pelvic space.  A 19 French drain was placed prior to this and brought out through the right lower quadrant and secured into place.  The abdomen was irrigated.  Hemostasis was good.  The left lower quadrant port  site that was placed through the previously marked ostomy site was enlarged to approximately 2 fingerbreadths.  The colon was pulled through this and secured.  The abdomen was desufflated and we confirmed no twisting of the mesentery around the ostomy site.  The drain was in place and hemostasis was good.  The abdomen was desufflated and the ports were removed.  The port sites were closed using interrupted 4-0 Vicryl sutures and Dermabond.  The ostomy was then matured in standard [place] fashion using interrupted 2-0 Vicryl sutures.  An ostomy appliance was placed.  The patient was then awakened from anesthesia and sent to the postanesthesia care unit in stable condition.  All counts were correct per operating room staff.  An MD assistant was necessary for tissue manipulation, retraction and positioning due to the complexity of the case and hospital policies

## 2020-08-22 NOTE — Anesthesia Preprocedure Evaluation (Addendum)
Anesthesia Evaluation  Patient identified by MRN, date of birth, ID band Patient awake    Reviewed: Allergy & Precautions, NPO status , Patient's Chart, lab work & pertinent test results  History of Anesthesia Complications (+) PONV, Family history of anesthesia reaction and history of anesthetic complications  Airway Mallampati: II  TM Distance: >3 FB Neck ROM: Full    Dental  (+) Dental Advisory Given, Missing   Pulmonary shortness of breath and with exertion, asthma ,    Pulmonary exam normal breath sounds clear to auscultation       Cardiovascular hypertension, Pt. on medications Normal cardiovascular exam Rhythm:Regular Rate:Normal     Neuro/Psych PSYCHIATRIC DISORDERS Depression negative neurological ROS     GI/Hepatic Neg liver ROS, GERD  Medicated,distal rectal cancer   Endo/Other  diabetes, Type 2, Oral Hypoglycemic AgentsHypothyroidism Obesity   Renal/GU negative Renal ROS     Musculoskeletal  (+) Arthritis ,   Abdominal   Peds  Hematology negative hematology ROS (+)   Anesthesia Other Findings Day of surgery medications reviewed with the patient.  Reproductive/Obstetrics                            Anesthesia Physical Anesthesia Plan  ASA: III  Anesthesia Plan: General   Post-op Pain Management:    Induction: Intravenous  PONV Risk Score and Plan: 4 or greater and Midazolam, Dexamethasone, Ondansetron and Propofol infusion  Airway Management Planned: Oral ETT  Additional Equipment:   Intra-op Plan:   Post-operative Plan: Extubation in OR  Informed Consent: I have reviewed the patients History and Physical, chart, labs and discussed the procedure including the risks, benefits and alternatives for the proposed anesthesia with the patient or authorized representative who has indicated his/her understanding and acceptance.     Dental advisory given  Plan  Discussed with: CRNA  Anesthesia Plan Comments: (2nd PIV, propofol infusion for PONV ppx)        Anesthesia Quick Evaluation

## 2020-08-22 NOTE — Anesthesia Postprocedure Evaluation (Signed)
Anesthesia Post Note  Patient: Tanya Mcclure  Procedure(s) Performed: XI ROBOTIC ABDOMINAL PERIITONEAL RESECTION (N/A Abdomen)     Patient location during evaluation: PACU Anesthesia Type: General Level of consciousness: awake and alert Pain management: pain level controlled Vital Signs Assessment: post-procedure vital signs reviewed and stable Respiratory status: spontaneous breathing, nonlabored ventilation, respiratory function stable and patient connected to nasal cannula oxygen Cardiovascular status: blood pressure returned to baseline and stable Postop Assessment: no apparent nausea or vomiting Anesthetic complications: no   No complications documented.  Last Vitals:  Vitals:   08/22/20 1715 08/22/20 1729  BP: 138/82 (!) 149/81  Pulse: 95 95  Resp: 15 16  Temp: 36.6 C 36.6 C  SpO2: 100% 100%    Last Pain:  Vitals:   08/22/20 1729  TempSrc: Oral  PainSc: 4                  Catalina Gravel

## 2020-08-22 NOTE — H&P (Signed)
The patient is a 60 year old female who presents with colorectal cancer. 60 year old female who presents to the office for evaluation of a rectal cancer diagnosed in December 2021 due to rectal bleeding and diarrhea. She was noted to have a distal rectal mass. Biopsy showed adenocarcinoma. CT scan show some perirectal enlarged lymph nodes, but no signs of metastatic disease. She underwent ports placement and started chemotherapy and radiation. This was completed on February 16. She presents to the office today for evaluation for surgery. Past surgical history is significant for open cholecystectomy and laparoscopic hysterectomy. Patient is recovering well from radiation. Her symptoms are improving. She is not a smoker. She continues to have some diarrhea and incontinence.   Past Surgical History  Gallbladder Surgery - Laparoscopic Hysterectomy (due to cancer) - Complete Hysterectomy (not due to cancer) - Complete  Diagnostic Studies History  Colonoscopy within last year Mammogram 1-3 years ago Pap Smear 1-5 years ago  Allergies  Penicillins  Medication History  Albuterol Sulfate (0.63MG /3ML Nebulized Soln, Inhalation) Active. Aspirin (81MG  Tablet, Oral) Active. Atorvastatin Calcium (40MG  Tablet, Oral) Active. Symbicort (160-4.5MCG/ACT Aerosol, Inhalation) Active. Bentyl (10MG  Capsule, Oral) Active. Vitamin D (50000UNIT Capsule, Oral) Active. Exenatide ER (2MG  Pen-injector, Subcutaneous) Active. Furosemide (20MG  Tablet, Oral) Active. Gabapentin (300MG  Capsule, Oral) Active. Hydrocodone-Acetaminophen (7.5-500MG  Tablet, Oral) Active. Levothyroxine Sodium (100MCG Tablet, Oral) Active. LORazepam (0.5MG  Tablet, Oral) Active. Meloxicam (15MG  Tablet, Oral) Active. metFORMIN HCl (500MG  Tablet, Oral) Active. Singulair (10MG  Tablet, Oral) Active. oxyCODONE HCl (5MG  Tablet, Oral) Active. Pantoprazole Sodium (40MG  Tablet DR, Oral) Active. Potassium  Chloride (20MEQ Tablet ER, Oral) Active. Compazine (10MG  Tablet, Oral) Active. Medications Reconciled  Social History  Caffeine use Carbonated beverages, Coffee, Tea. No alcohol use No drug use Tobacco use Never smoker.  Family History  Alcohol Abuse Daughter, Father. Arthritis Father, Mother. Colon Polyps Father. Diabetes Mellitus Father, Mother, Sister. Heart Disease Mother. Hypertension Mother. Respiratory Condition Daughter, Sister.  Pregnancy / Birth History  Age at menarche 80 years. Gravida 3 Maternal age 41-25 Para 3  Other Problems  Asthma Back Pain Cholelithiasis Colon Cancer Diabetes Mellitus Gastric Ulcer Gastroesophageal Reflux Disease High blood pressure Hypercholesterolemia Thyroid Disease     Review of Systems  General Present- Appetite Loss, Fatigue and Weight Loss. Not Present- Chills, Fever, Night Sweats and Weight Gain. Skin Not Present- Change in Wart/Mole, Dryness, Hives, Jaundice, New Lesions, Non-Healing Wounds, Rash and Ulcer. HEENT Present- Seasonal Allergies and Wears glasses/contact lenses. Not Present- Earache, Hearing Loss, Hoarseness, Nose Bleed, Oral Ulcers, Ringing in the Ears, Sinus Pain, Sore Throat, Visual Disturbances and Yellow Eyes. Respiratory Present- Snoring. Not Present- Bloody sputum, Chronic Cough, Difficulty Breathing and Wheezing. Breast Not Present- Breast Mass, Breast Pain, Nipple Discharge and Skin Changes. Cardiovascular Present- Palpitations. Not Present- Chest Pain, Difficulty Breathing Lying Down, Leg Cramps, Rapid Heart Rate, Shortness of Breath and Swelling of Extremities. Gastrointestinal Present- Abdominal Pain, Chronic diarrhea, Nausea and Vomiting. Not Present- Bloating, Bloody Stool, Change in Bowel Habits, Constipation, Difficulty Swallowing, Excessive gas, Gets full quickly at meals, Hemorrhoids, Indigestion and Rectal Pain. Female Genitourinary Present- Frequency. Not  Present- Nocturia, Painful Urination, Pelvic Pain and Urgency. Musculoskeletal Present- Back Pain. Not Present- Joint Pain, Joint Stiffness, Muscle Pain, Muscle Weakness and Swelling of Extremities. Neurological Not Present- Decreased Memory, Fainting, Headaches, Numbness, Seizures, Tingling, Tremor, Trouble walking and Weakness. Psychiatric Not Present- Anxiety, Bipolar, Change in Sleep Pattern, Depression, Fearful and Frequent crying. Endocrine Not Present- Cold Intolerance, Excessive Hunger, Hair Changes, Heat Intolerance, Hot flashes and New Diabetes. Hematology  Not Present- Blood Thinners, Easy Bruising, Excessive bleeding, Gland problems, HIV and Persistent Infections.  BP (!) 161/86   Pulse 87   Temp 98.4 F (36.9 C) (Oral)   Resp 17   LMP  (LMP Unknown)   SpO2 98%    Physical Exam   General Mental Status-Alert. General Appearance-Cooperative. RRR Lungs: Clear Abdomen Palpation/Percussion Palpation and Percussion of the abdomen reveal - Soft and Non Tender.  Rectal Anorectal Exam Internal - normal sphincter tone. Note: Mass palpated in the left posterior lateral distal rectum, approximately 1 cm margin between tumor and sphincter complex.    Assessment & Plan   RECTAL CANCER ([facility]) Impression: 60 year old female who presents to the office for evaluation of rectal cancer. She has just completed chemotherapy and radiation for a distal rectal cancer, clinical stage III. Marland Kitchen Digital rectal exam reveals a left posterior lateral tumor with approximately 1 cm margin to the top of the anal canal. We will get an open MRI to evaluate for possibility of low anterior resection versus abdominal perineal resection. We have discussed those options in detail. Patient agrees that priority should be placed on removing her cancer safely first and keeping her continent second. We discussed the need for at least a temporary ileostomy. We discussed the timing of surgery. The surgery  and anatomy were described to the patient as well as the risks of surgery and the possible complications. These include: Bleeding, deep abdominal infections and possible wound complications such as hernia and infection, damage to adjacent structures, leak of surgical connections, which can lead to other surgeries and possibly an ostomy, possible need for other procedures, such as abscess drains in radiology, possible prolonged hospital stay, possible diarrhea from removal of part of the colon, possible constipation from narcotics, possible bowel, bladder or sexual dysfunction if having rectal surgery, prolonged fatigue/weakness or appetite loss, possible early recurrence of of disease, possible complications of their medical problems such as heart disease or arrhythmias or lung problems, death (less than 1%). I believe the patient understands and wishes to proceed with the surgery.

## 2020-08-23 ENCOUNTER — Encounter (HOSPITAL_COMMUNITY): Payer: Self-pay | Admitting: General Surgery

## 2020-08-23 LAB — CBC
HCT: 37.8 % (ref 36.0–46.0)
Hemoglobin: 12.2 g/dL (ref 12.0–15.0)
MCH: 28.9 pg (ref 26.0–34.0)
MCHC: 32.3 g/dL (ref 30.0–36.0)
MCV: 89.6 fL (ref 80.0–100.0)
Platelets: 304 10*3/uL (ref 150–400)
RBC: 4.22 MIL/uL (ref 3.87–5.11)
RDW: 14.2 % (ref 11.5–15.5)
WBC: 9 10*3/uL (ref 4.0–10.5)
nRBC: 0 % (ref 0.0–0.2)

## 2020-08-23 LAB — BASIC METABOLIC PANEL
Anion gap: 8 (ref 5–15)
BUN: 12 mg/dL (ref 8–23)
CO2: 28 mmol/L (ref 22–32)
Calcium: 9.1 mg/dL (ref 8.9–10.3)
Chloride: 100 mmol/L (ref 98–111)
Creatinine, Ser: 0.76 mg/dL (ref 0.44–1.00)
GFR, Estimated: >60 mL/min (ref >60)
Glucose, Bld: 263 mg/dL — ABNORMAL HIGH (ref 70–99)
Potassium: 5.2 mmol/L — ABNORMAL HIGH (ref 3.5–5.1)
Sodium: 136 mmol/L (ref 135–145)

## 2020-08-23 LAB — GLUCOSE, CAPILLARY
Glucose-Capillary: 170 mg/dL — ABNORMAL HIGH (ref 70–99)
Glucose-Capillary: 176 mg/dL — ABNORMAL HIGH (ref 70–99)
Glucose-Capillary: 172 mg/dL — ABNORMAL HIGH (ref 70–99)
Glucose-Capillary: 154 mg/dL — ABNORMAL HIGH (ref 70–99)

## 2020-08-23 MED ORDER — CHLORHEXIDINE GLUCONATE CLOTH 2 % EX PADS
6.0000 | MEDICATED_PAD | Freq: Every day | CUTANEOUS | Status: DC
  Administered 2020-08-23 – 2020-08-25 (×2): 6 via TOPICAL

## 2020-08-23 MED ORDER — DEXTROSE-NACL 5-0.45 % IV SOLN: INTRAVENOUS | Status: DC

## 2020-08-23 MED ORDER — TRAMADOL HCL 50 MG PO TABS
50.0000 mg | ORAL_TABLET | Freq: Four times a day (QID) | ORAL | Status: DC | PRN
  Administered 2020-08-25: 100 mg via ORAL
  Filled 2020-08-23: qty 2

## 2020-08-23 NOTE — Evaluation (Signed)
Physical Therapy Evaluation Patient Details Name: Tanya Mcclure MRN: [id number] DOB: 1960/12/30 Today's Date: 08/23/2020   History of Present Illness  60 y.o. 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  Patient will benefit from skilled PT to increase their independence and safety with mobility to allow discharge to the venue listed below.  Pt assisted with ambulating in hallway and reports most pain/soreness with transitional movements (as anticipated post op).  Pt reports she lives with 3 children however family can take care of them if need upon d/c so pt can focus on recovery.  Recommend RW upon d/c at this time and anticipate pt to progress home without PT needs.     Follow Up Recommendations No PT follow up    Equipment Recommendations  Rolling walker with 5" wheels    Recommendations for Other Services       Precautions / Restrictions Precautions Precautions: Fall Precaution Comments: L UQ colostomy, JP drain on right      Mobility  Bed Mobility Overal bed mobility: Needs Assistance Bed Mobility: Supine to Sit;Rolling;Sit to Sidelying Rolling: Min assist   Supine to sit: Mod assist   Sit to sidelying: Min assist General bed mobility comments: pt twisted in bed on arrival and required mod assist for trunk upright; pt educated on log roll technique for pain and performed upon returning to bed    Transfers Overall transfer level: Needs assistance Equipment used: Rolling walker (2 wheeled) Transfers: Sit to/from Stand Sit to Stand: Min assist         General transfer comment: assist to rise, cues for hand placement  Ambulation/Gait Ambulation/Gait assistance: Min assist Gait Distance (Feet): 160 Feet Assistive  device: Rolling walker (2 wheeled) Gait Pattern/deviations: Step-through pattern;Decreased stride length     General Gait Details: slow but steady with RW, only 4/10 pain at surgical sites (premedicated)  Stairs            Wheelchair Mobility    Modified Rankin (Stroke Patients Only)       Balance                                             Pertinent Vitals/Pain Pain Assessment: 0-10 Pain Score: 4  Pain Location: surgical site Pain Descriptors / Indicators: Guarding;Grimacing;Sore Pain Intervention(s): Monitored during session;Premedicated before session;Repositioned;RN gave pain meds during session (IV med premedicate for mobility)    Home Living Family/patient expects to be discharged to:: Private residence Living Arrangements: Children (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) Available Help at Discharge: Family;Available PRN/intermittently (family lives across the street) Type of Home: Apartment Home Access: Level entry     Home Layout: One level Home Equipment: None      Prior Function Level of Independence: Independent               Hand Dominance        Extremity/Trunk Assessment        Lower Extremity Assessment Lower Extremity Assessment: Generalized weakness    Cervical / Trunk Assessment Cervical / Trunk Assessment: Normal  Communication   Communication: No difficulties  Cognition Arousal/Alertness: Awake/alert Behavior During Therapy: WFL for tasks assessed/performed Overall Cognitive Status: Within Functional  Limits for tasks assessed                                        General Comments      Exercises     Assessment/Plan    PT Assessment Patient needs continued PT services  PT Problem List Decreased strength;Decreased mobility;Decreased activity tolerance;Decreased knowledge of use of DME       PT Treatment Interventions Gait training;Therapeutic exercise;DME instruction;Therapeutic  activities;Patient/family education;Functional mobility training    PT Goals (Current goals can be found in the Care Plan section)  Acute Rehab PT Goals PT Goal Formulation: With patient Time For Goal Achievement: 09/06/20 Potential to Achieve Goals: Good    Frequency Min 3X/week   Barriers to discharge        Co-evaluation               AM-PAC PT "6 Clicks" Mobility  Outcome Measure Help needed turning from your back to your side while in a flat bed without using bedrails?: A Lot Help needed moving from lying on your back to sitting on the side of a flat bed without using bedrails?: A Lot Help needed moving to and from a bed to a chair (including a wheelchair)?: A Lot Help needed standing up from a chair using your arms (e.g., wheelchair or bedside chair)?: A Little Help needed to walk in hospital room?: A Little Help needed climbing 3-5 steps with a railing? : A Lot 6 Click Score: 14    End of Session Equipment Utilized During Treatment: Gait belt Activity Tolerance: Patient tolerated treatment well Patient left: with call bell/phone within reach;in bed Nurse Communication: Mobility status PT Visit Diagnosis: Difficulty in walking, not elsewhere classified (R26.2)    Time: 0630-1601 PT Time Calculation (min) (ACUTE ONLY): 20 min   Charges:   PT Evaluation $PT Eval Low Complexity: 1 Low         Kati PT, DPT Acute Rehabilitation Services Pager: [phone] Office: [phone]  York Ram E 08/23/2020, 3:28 PM

## 2020-08-23 NOTE — Progress Notes (Addendum)
1 Day Post-Op robotic assisted APR Subjective: Having perineal pain.  Tolerating sips of liquids.  Has gotten out of bed to the chair  Objective: Vital signs in last 24 hours: Temp:  [97.8 F (36.6 C)-99 F (37.2 C)] 99 F (37.2 C) (04/14 0440) Pulse Rate:  [76-100] 91 (04/14 0917) Resp:  [12-18] 18 (04/14 0917) BP: (127-153)/(62-87) 127/62 (04/14 0917) SpO2:  [99 %-100 %] 99 % (04/14 0917) Weight:  [80.3 kg] 80.3 kg (04/13 1105)   Intake/Output from previous day: 04/13 0701 - 04/14 0700 In: 2483.4 [P.O.:180; I.V.:2203.4; IV Piggyback:100] Out: 1875 [Urine:1550; Drains:95; Blood:150] Intake/Output this shift: Total I/O In: 199.9 [I.V.:199.9] Out: 680 [Urine:300; Drains:380]   General appearance: alert and cooperative GI: normal findings: soft, appropriately tender Ostomy pink, edematous Incision: no significant drainage  Lab Results:  Recent Labs    08/23/20 0412  WBC 9.0  HGB 12.2  HCT 37.8  PLT 304   BMET Recent Labs    08/23/20 0412  NA 136  K 5.2*  CL 100  CO2 28  GLUCOSE 263*  BUN 12  CREATININE 0.76  CALCIUM 9.1   PT/INR No results for input(s): LABPROT, INR in the last 72 hours. ABG No results for input(s): PHART, HCO3 in the last 72 hours.  Invalid input(s): PCO2, PO2  MEDS, Scheduled . acetaminophen  1,000 mg Oral Q6H  . alvimopan  12 mg Oral BID  . Chlorhexidine Gluconate Cloth  6 each Topical Daily  . enoxaparin (LOVENOX) injection  40 mg Subcutaneous Q24H  . feeding supplement  237 mL Oral BID BM  . furosemide  20 mg Oral Daily  . gabapentin  300 mg Oral BID  . insulin aspart  0-20 Units Subcutaneous TID WC  . insulin aspart  0-5 Units Subcutaneous QHS  . levothyroxine  100 mcg Oral QAC breakfast  . loratadine  10 mg Oral Daily  . [START ON 08/24/2020] metFORMIN  500 mg Oral BID WC  . montelukast  10 mg Oral Daily  . pantoprazole  40 mg Oral BID  . potassium chloride SA  20 mEq Oral BID  . saccharomyces boulardii  250 mg Oral BID     Studies/Results: No results found.  Assessment: s/p Procedure(s): XI ROBOTIC ABDOMINAL PERIITONEAL RESECTION Patient Active Problem List   Diagnosis Date Noted  . Hypokalemia 06/29/2020  . Rectal cancer ([place]) 04/23/2020    Class: Diagnosis of  . UTI 01/28/2008  . MENORRHAGIA, PERIMENOPAUSAL 01/28/2008  . GASTROENTERITIS, VIRAL 10/12/2007  . VAGINITIS, CANDIDAL 07/05/2007  . URI 07/05/2007  . POLYURIA 07/05/2007  . PYELONEPHRITIS, ACUTE 03/03/2007  . HYPOTHYROIDISM 12/29/2006  . OBESITY NOS 12/29/2006  . DEPRESSION 12/29/2006  . HYPERTENSION 12/29/2006  . ALLERGIC RHINITIS, SEASONAL 12/29/2006  . BRONCHITIS 12/29/2006  . ASTHMA 12/29/2006  . ENDOMETRIOSIS 12/29/2006  . BREAST MASS, LEFT 11/03/2005    Expected post op course  Plan: Cont clears for now, advance as tolerated  Ambulate in hall, will get PT eval  Ostomy teaching Uncontrolled DM, pt states she's not taking her meds.  Will have diabetic educator discuss with pt   LOS: 1 day     .Rosario Adie, MD [facility], [place]    08/23/2020 10:30 AM

## 2020-08-23 NOTE — Consult Note (Signed)
WOC Nurse ostomy consult note Stoma type/location: LUQ colostomy Stomal assessment/size: 1 3/4" stoma  Pink and moist.Slightly budded.  Abdomen obese, round and edematous Peristomal assessment: rounded abdomen may make pouching a challenge.  I have fitted into a pouch that will accommodate an ostomy belt, but will need larger size than we carry,  Will have it sent in secure start kit. Enrolled today.  Discussed the outpatient ostomy clinic within [facility] if she needs further assistance after discharge.  [phone] Treatment options for stomal/peristomal skin: barrier ring and 1piece convex pouch Output blood tinged liquid only today.  Ostomy pouching: 1pc.convex with pouch Education provided: Patient feels up to doing a pouch change/teaching today.  She lives at home with 3 children and thinks she will be the only one involved in her ostomy care. We remove the pouch. Discuss twice weekly pouches, but also that everyone is different and some potential challenges with rounded abdomen and creasing to abdominal planes.  We discuss a belt option to add to her pouch if needed (would be special order) and the possibility of being seen in the outpatient ostomy clinic. I provide her with written materials.  We measure the stoma and cut pouch to fit.  She observes barrier ring application and pouch application.  She is able to demonstrate roll open and close.  Ongoing teaching will be provided once bowel function returns.  Enrolled patient in [employer] Secure Start DC program: Yes today Will follow.  Karen Sanders MSN, RN, FNP-BC CWON Wound, Ostomy, Continence Nurse Pager [phone]   

## 2020-08-24 ENCOUNTER — Other Ambulatory Visit: Payer: Self-pay

## 2020-08-24 LAB — CBC
HCT: 35.1 % — ABNORMAL LOW (ref 36.0–46.0)
Hemoglobin: 11.3 g/dL — ABNORMAL LOW (ref 12.0–15.0)
MCH: 28.9 pg (ref 26.0–34.0)
MCHC: 32.2 g/dL (ref 30.0–36.0)
MCV: 89.8 fL (ref 80.0–100.0)
Platelets: 254 10*3/uL (ref 150–400)
RBC: 3.91 MIL/uL (ref 3.87–5.11)
RDW: 14.2 % (ref 11.5–15.5)
WBC: 6.9 10*3/uL (ref 4.0–10.5)
nRBC: 0 % (ref 0.0–0.2)

## 2020-08-24 LAB — GLUCOSE, CAPILLARY
Glucose-Capillary: 164 mg/dL — ABNORMAL HIGH (ref 70–99)
Glucose-Capillary: 186 mg/dL — ABNORMAL HIGH (ref 70–99)
Glucose-Capillary: 150 mg/dL — ABNORMAL HIGH (ref 70–99)
Glucose-Capillary: 201 mg/dL — ABNORMAL HIGH (ref 70–99)

## 2020-08-24 LAB — BASIC METABOLIC PANEL
Anion gap: 6 (ref 5–15)
BUN: 10 mg/dL (ref 8–23)
CO2: 28 mmol/L (ref 22–32)
Calcium: 8.6 mg/dL — ABNORMAL LOW (ref 8.9–10.3)
Chloride: 100 mmol/L (ref 98–111)
Creatinine, Ser: 0.6 mg/dL (ref 0.44–1.00)
GFR, Estimated: >60 mL/min (ref >60)
Glucose, Bld: 173 mg/dL — ABNORMAL HIGH (ref 70–99)
Potassium: 3.4 mmol/L — ABNORMAL LOW (ref 3.5–5.1)
Sodium: 134 mmol/L — ABNORMAL LOW (ref 135–145)

## 2020-08-24 MED ORDER — OXYCODONE HCL 5 MG PO TABS
5.0000 mg | ORAL_TABLET | ORAL | Status: DC | PRN
  Administered 2020-08-24 (×3): 10 mg via ORAL
  Filled 2020-08-24 (×3): qty 2

## 2020-08-24 NOTE — Progress Notes (Signed)
2 Days Post-Op robotic assisted APR Subjective: Having perineal pain.  Tolerating clears.  Has been walking in hall  Objective: Vital signs in last 24 hours: Temp:  [98.5 F (36.9 C)-99.9 F (37.7 C)] 98.6 F (37 C) (04/15 0601) Pulse Rate:  [84-94] 84 (04/15 0601) Resp:  [16-20] 16 (04/15 0601) BP: (109-127)/(58-73) 122/73 (04/15 0601) SpO2:  [96 %-99 %] 96 % (04/15 0601)   Intake/Output from previous day: 04/14 0701 - 04/15 0700 In: 1815.1 [P.O.:600; I.V.:1215.1] Out: 4580 [Urine:4150; Drains:430] Intake/Output this shift: No intake/output data recorded.   General appearance: alert and cooperative GI: normal findings: soft, appropriately tender Ostomy pink, edematous, air and stool in bag Incision: no significant drainage  Lab Results:  Recent Labs    08/23/20 0412 08/24/20 0400  WBC 9.0 6.9  HGB 12.2 11.3*  HCT 37.8 35.1*  PLT 304 254   BMET Recent Labs    08/23/20 0412 08/24/20 0400  NA 136 134*  K 5.2* 3.4*  CL 100 100  CO2 28 28  GLUCOSE 263* 173*  BUN 12 10  CREATININE 0.76 0.60  CALCIUM 9.1 8.6*   PT/INR No results for input(s): LABPROT, INR in the last 72 hours. ABG No results for input(s): PHART, HCO3 in the last 72 hours.  Invalid input(s): PCO2, PO2  MEDS, Scheduled . acetaminophen  1,000 mg Oral Q6H  . Chlorhexidine Gluconate Cloth  6 each Topical Daily  . enoxaparin (LOVENOX) injection  40 mg Subcutaneous Q24H  . feeding supplement  237 mL Oral BID BM  . furosemide  20 mg Oral Daily  . gabapentin  300 mg Oral BID  . insulin aspart  0-20 Units Subcutaneous TID WC  . insulin aspart  0-5 Units Subcutaneous QHS  . levothyroxine  100 mcg Oral QAC breakfast  . loratadine  10 mg Oral Daily  . metFORMIN  500 mg Oral BID WC  . montelukast  10 mg Oral Daily  . pantoprazole  40 mg Oral BID  . potassium chloride SA  20 mEq Oral BID  . saccharomyces boulardii  250 mg Oral BID    Studies/Results: No results found.  Assessment: s/p  Procedure(s): XI ROBOTIC ABDOMINAL PERIITONEAL RESECTION Patient Active Problem List   Diagnosis Date Noted  . Hypokalemia 06/29/2020  . Rectal cancer ([place]) 04/23/2020    Class: Diagnosis of  . UTI 01/28/2008  . MENORRHAGIA, PERIMENOPAUSAL 01/28/2008  . GASTROENTERITIS, VIRAL 10/12/2007  . VAGINITIS, CANDIDAL 07/05/2007  . URI 07/05/2007  . POLYURIA 07/05/2007  . PYELONEPHRITIS, ACUTE 03/03/2007  . HYPOTHYROIDISM 12/29/2006  . OBESITY NOS 12/29/2006  . DEPRESSION 12/29/2006  . HYPERTENSION 12/29/2006  . ALLERGIC RHINITIS, SEASONAL 12/29/2006  . BRONCHITIS 12/29/2006  . ASTHMA 12/29/2006  . ENDOMETRIOSIS 12/29/2006  . BREAST MASS, LEFT 11/03/2005    Expected post op course  Plan: CM diet Ambulate in hall,  PT eval shows no home health needs Ostomy teaching in progress, will arrange f/u in outpt ostomy clinic Uncontrolled DM, pt states she's not taking her meds.      -diabetic educator consulted to discuss with pt but they have not seen her yet   -SSI and restart home meds today with PO diet    LOS: 2 days     .Rosario Adie, [facility], [place]    08/24/2020 8:55 AM

## 2020-08-24 NOTE — Progress Notes (Signed)
Physical Therapy Treatment Patient Details Name: Tanya Mcclure MRN: [id number] DOB: 1961/01/31 Today's Date: 08/24/2020    History of Present Illness 60 y.o. 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 RW used more for pain control today as her gait stability and tolerance is much improved;  Bariatric chair removed from room and  Replaced with regular recliner (after significant cleaning of said chair which was taken from clean room, however visibly soiled).  Provided pt with over bed table since she has been "eating off her lap".    Follow Up Recommendations  No PT follow up     Equipment Recommendations  Rolling walker with 5" wheels    Recommendations for Other Services       Precautions / Restrictions Precautions Precautions: Fall Precaution Comments: L UQ colostomy, JP drain on right Restrictions Weight Bearing Restrictions: No    Mobility  Bed Mobility Overal bed mobility: Needs Assistance Bed Mobility: Rolling;Sit to Sidelying Rolling: Supervision       Sit to sidelying: Min guard General bed mobility comments: min/guard for lines and safety. cues for technique to decr pain    Transfers Overall transfer level: Needs assistance Equipment used: Rolling walker (2 wheeled) Transfers: Sit to/from Stand Sit to Stand: Min guard         General transfer comment: min/guard for safety, cues for hand placement  Ambulation/Gait Ambulation/Gait assistance: Min guard;Supervision Gait Distance (Feet): 280 Feet Assistive device: Rolling walker (2 wheeled) Gait Pattern/deviations: Step-through pattern;Decreased stride length     General Gait Details: slow but steady with RW, pain incr with distance but pt able to tolerate well (premedicated)   Stairs             Wheelchair Mobility    Modified  [place] (Stroke Patients Only)       Balance                                            Cognition Arousal/Alertness: Awake/alert Behavior During Therapy: WFL for tasks assessed/performed Overall Cognitive Status: Within Functional Limits for tasks assessed                                        Exercises      General Comments        Pertinent Vitals/Pain Pain Assessment: 0-10 Pain Score: 6  Pain Location: surgical site, pelvic floor Pain Descriptors / Indicators: Guarding;Grimacing;Sore Pain Intervention(s): Limited activity within patient's tolerance;Monitored during session;Repositioned;Premedicated before session    Home Living                      Prior Function            PT Goals (current goals can now be found in the care plan section) Acute Rehab PT Goals Patient Stated Goal: see her grandkids PT Goal Formulation: With patient Time For Goal Achievement: 09/06/20 Potential to Achieve Goals: Good Progress towards PT goals: Progressing toward goals    Frequency    Min 3X/week      PT Plan Current plan remains appropriate    Co-evaluation  AM-PAC PT "6 Clicks" Mobility   Outcome Measure  Help needed turning from your back to your side while in a flat bed without using bedrails?: A Little Help needed moving from lying on your back to sitting on the side of a flat bed without using bedrails?: A Little Help needed moving to and from a bed to a chair (including a wheelchair)?: A Little Help needed standing up from a chair using your arms (e.g., wheelchair or bedside chair)?: A Little Help needed to walk in hospital room?: A Little Help needed climbing 3-5 steps with a railing? : A Lot 6 Click Score: 17    End of Session   Activity Tolerance: Patient tolerated treatment well Patient left: with call bell/phone within reach;in bed   PT Visit Diagnosis: Difficulty in walking, not elsewhere  classified (R26.2)     Time: 3220-2542 PT Time Calculation (min) (ACUTE ONLY): 19 min  Charges:  $Gait Training: 8-22 mins                     Baxter Flattery, PT  Acute Rehab Dept ([place]) [phone] Pager [phone]  08/24/2020    [facility] 08/24/2020, 10:47 AM

## 2020-08-24 NOTE — Consult Note (Addendum)
[place] Nurse ostomy follow up Patient instructed in emptying ostomy pouch and is working toward independence in this today. Bedside RN H. Rogelia Boga to document when independent. Is ambulating in hallway.  [place] nursing team will follow, and will remain available to this patient, the nursing and medical teams.   Thanks, Maudie Flakes, MSN, RN, [place], Arther Abbott  Pager# [phone]

## 2020-08-24 NOTE — Progress Notes (Signed)
Patient successfully emptied ostomy bag on her own.

## 2020-08-25 ENCOUNTER — Other Ambulatory Visit (HOSPITAL_COMMUNITY): Payer: Self-pay

## 2020-08-25 ENCOUNTER — Other Ambulatory Visit: Payer: Self-pay

## 2020-08-25 LAB — BASIC METABOLIC PANEL
Anion gap: 12 (ref 5–15)
BUN: 14 mg/dL (ref 8–23)
CO2: 27 mmol/L (ref 22–32)
Calcium: 9.3 mg/dL (ref 8.9–10.3)
Chloride: 98 mmol/L (ref 98–111)
Creatinine, Ser: 0.72 mg/dL (ref 0.44–1.00)
GFR, Estimated: >60 mL/min (ref >60)
Glucose, Bld: 137 mg/dL — ABNORMAL HIGH (ref 70–99)
Potassium: 3.5 mmol/L (ref 3.5–5.1)
Sodium: 137 mmol/L (ref 135–145)

## 2020-08-25 LAB — CBC
HCT: 41.2 % (ref 36.0–46.0)
Hemoglobin: 13.2 g/dL (ref 12.0–15.0)
MCH: 28.6 pg (ref 26.0–34.0)
MCHC: 32 g/dL (ref 30.0–36.0)
MCV: 89.4 fL (ref 80.0–100.0)
Platelets: 280 10*3/uL (ref 150–400)
RBC: 4.61 MIL/uL (ref 3.87–5.11)
RDW: 13.8 % (ref 11.5–15.5)
WBC: 7.9 10*3/uL (ref 4.0–10.5)
nRBC: 0 % (ref 0.0–0.2)

## 2020-08-25 LAB — GLUCOSE, CAPILLARY: Glucose-Capillary: 158 mg/dL — ABNORMAL HIGH (ref 70–99)

## 2020-08-25 MED ORDER — OXYCODONE HCL 5 MG PO TABS
5.0000 mg | ORAL_TABLET | Freq: Four times a day (QID) | ORAL | 0 refills | Status: DC | PRN
  Filled 2020-08-25: qty 30, 4d supply, fill #0

## 2020-08-25 NOTE — Discharge Summary (Signed)
Physician Discharge Summary  Patient ID: Tanya Mcclure MRN: [id number] DOB/AGE: 1961-01-02 61 y.o.  Admit date: 08/22/2020 Discharge date: 08/25/2020  Admission Diagnoses: rectal cancer  Discharge Diagnoses:  Active Problems:   Rectal cancer [facility])   Discharged Condition: good  Hospital Course: Patient was admitted to the floor after robotic assisted abdominal perineal resection.  Her diet was advanced as tolerated.  Physical therapy was consulted for evaluation.  No home PT needs were identified except for rolling walker.  Patient's blood sugars were monitored and treated during the postoperative period.  By postop day 3 she was felt to be in stable condition and tolerating a regular diet.  She had undergone ostomy teaching and was able to empty and change her pouch.  We will discharge her to home with home health to evaluate her perineal wound and help with ostomy teaching.  She will also follow-up in the ostomy clinic for further ostomy teaching.  Consults: None  Significant Diagnostic Studies: labs: cbc, bmet  Treatments: IV hydration, analgesia: oxycodone, therapies: PT and surgery: robotic APR  Discharge Exam: Blood pressure 125/66, pulse 87, temperature 98.9 F (37.2 C), temperature source Oral, resp. rate 14, height 5' (1.524 m), weight 80.3 kg, SpO2 95 %. General appearance: alert and cooperative GI: normal findings: soft, non-tender Incision/Wound: clean, dry, intact  Disposition: Discharge disposition: 01-Home or Self Care        Allergies as of 08/25/2020      Reactions   Penicillins Rash      Medication List    STOP taking these medications   HYDROcodone-acetaminophen 7.5-325 MG tablet Commonly known as: NORCO     TAKE these medications   Accu-Chek Aviva Plus test strip Generic drug: glucose blood Check blood glucose twice daily and as needed. Dx E 11.65   Accu-Chek FastClix Lancet Kit Check blood glucose twice daily. Dx E 11.65   Accu-Chek Softclix  Lancets lancets USE TO CHECK BLOOD GLUCOSE TWICE DAILY.   albuterol (2.5 MG/3ML) 0.083% nebulizer solution Commonly known as: PROVENTIL Take 2.5 mg by nebulization every 6 (six) hours as needed for wheezing or shortness of breath.   albuterol 108 (90 Base) MCG/ACT inhaler Commonly known as: VENTOLIN HFA Inhale 2 puffs into the lungs every 6 (six) hours as needed for shortness of breath or wheezing.   aspirin 81 MG EC tablet Take 81 mg by mouth daily.   atorvastatin 40 MG tablet Commonly known as: LIPITOR Take 40 mg by mouth daily.   cetirizine 10 MG tablet Commonly known as: ZYRTEC Take 10 mg by mouth daily as needed for allergies.   dicyclomine 10 MG capsule Commonly known as: Bentyl Take 1 capsule (10 mg total) by mouth 4 (four) times daily -  before meals and at bedtime. What changed:   when to take this  reasons to take this   diphenoxylate-atropine 2.5-0.025 MG tablet Commonly known as: LOMOTIL Take 2 tablets by mouth 4 (four) times daily as needed for diarrhea or loose stools.   furosemide 20 MG tablet Commonly known as: LASIX Take 20 mg by mouth daily.   gabapentin 300 MG capsule Commonly known as: NEURONTIN Take 300 mg by mouth 2 (two) times daily.   levothyroxine 100 MCG tablet Commonly known as: SYNTHROID Take 100 mcg by mouth daily before breakfast.   metFORMIN 500 MG 24 hr tablet Commonly known as: GLUCOPHAGE-XR Take 500 mg by mouth 2 (two) times daily with a meal.   montelukast 10 MG tablet Commonly known as: SINGULAIR Take  10 mg by mouth daily.   ondansetron 4 MG tablet Commonly known as: ZOFRAN Take 1 tablet (4 mg total) by mouth every 4 (four) hours as needed for nausea.   oxyCODONE 5 MG immediate release tablet Commonly known as: Oxy IR/ROXICODONE Take 1-2 tablets (5-10 mg total) by mouth every 6 (six) hours as needed for moderate pain or severe pain.   pantoprazole 40 MG tablet Commonly known as: PROTONIX Take 40 mg by mouth 2 (two)  times daily.   potassium chloride SA 20 MEQ tablet Commonly known as: KLOR-CON Take 1 tablet (20 mEq total) by mouth 2 (two) times daily.   prochlorperazine 10 MG tablet Commonly known as: COMPAZINE Take 1 tablet (10 mg total) by mouth every 6 (six) hours as needed for nausea or vomiting.            Durable Medical Equipment  (From admission, onward)         Start     Ordered   08/25/20 0809  DME Walker rolling  (Discharge Planning)  Once       Question Answer Comment  Walker: With [place]   Patient needs a walker to treat with the following condition Post-operative pain      08/25/20 0809          Follow-up Information    Leighton Ruff, MD. Schedule an appointment as soon as possible for a visit in 2 week(s).   Specialties: General Surgery, Colon and Rectal Surgery Contact information: [place] [address] [phone]               Signed: Rosario Adie [birth year]34/1963, 8:18 AM

## 2020-08-25 NOTE — TOC Transition Note (Signed)
Transition of Care [facility]) - CM/SW Discharge Note   Patient Details  Name: Delsa Walder MRN: [id number] Date of Birth: 18-Nov-1960  Transition of Care (TOC) CM/SW Contact:  Joaquin Courts, RN Phone Number: 08/25/2020, 11:56 AM   Clinical Narrative:    Alvis Lemmings to provide home health RN services, rep Jenny Reichmann given referral.   Final next level of care: Home w Home Health Services Barriers to Discharge: No Barriers Identified   Patient Goals and CMS Choice Patient states their goals for this hospitalization and ongoing recovery are:: to go home CMS Medicare.gov Compare Post Acute Care list provided to:: Patient Choice offered to / list presented to : Patient  Discharge Placement                       Discharge Plan and Services   Discharge Planning Services: CM Consult Post Acute Care Choice: Home Health                    HH Arranged: RN [place]: [place] Date [place]: 08/25/20 Time [place]: 3570 Representative spoke with at [place]: [place] Determinants of Health ([place]) Interventions     Readmission Risk Interventions No flowsheet data found.

## 2020-08-25 NOTE — Discharge Instructions (Signed)
ABDOMINAL SURGERY: POST OP INSTRUCTIONS  1. DIET: Follow a light bland diet the first 24 hours after arrival home, such as soup, liquids, crackers, etc.  Be sure to include lots of fluids daily.  Avoid fast food or heavy meals as your are more likely to get nauseated.  Do not eat any uncooked fruits or vegetables for the next 2 weeks as your colon heals. 2. Take your usually prescribed home medications unless otherwise directed. 3. PAIN CONTROL: a. Pain is best controlled by a usual combination of three different methods TOGETHER: i. Ice/Heat ii. Over the counter pain medication iii. Prescription pain medication b. Most patients will experience some swelling and bruising around the incisions.  Ice packs or heating pads (30-60 minutes up to 6 times a day) will help. Use ice for the first few days to help decrease swelling and bruising, then switch to heat to help relax tight/sore spots and speed recovery.  Some people prefer to use ice alone, heat alone, alternating between ice & heat.  Experiment to what works for you.  Swelling and bruising can take several weeks to resolve.   c. It is helpful to take an over-the-counter pain medication regularly for the first few weeks.  Choose one of the following that works best for you: i. Naproxen (Aleve, etc)  Two 220mg  tabs twice a day ii. Ibuprofen (Advil, etc) Three 200mg  tabs four times a day (every meal & bedtime) iii. Acetaminophen (Tylenol, etc) 500-650mg  four times a day (every meal & bedtime) d. A  prescription for pain medication (such as oxycodone, hydrocodone, etc) should be given to you upon discharge.  Take your pain medication as prescribed.  i. If you are having problems/concerns with the prescription medicine (does not control pain, nausea, vomiting, rash, itching, etc), please call us [phone] to see if we need to switch you to a different pain medicine that will work better for you and/or control your side effect better. ii. If you  need a refill on your pain medication, please contact your pharmacy.  They will contact our office to request authorization. Prescriptions will not be filled after 5 pm or on week-ends. 4. Avoid getting constipated.  Between the surgery and the pain medications, it is common to experience some constipation.  Increasing fluid intake and taking a fiber supplement (such as Metamucil, Citrucel, FiberCon, MiraLax, etc) 1-2 times a day regularly will usually help prevent this problem from occurring.  A mild laxative (prune juice, Milk of Magnesia, MiraLax, etc) should be taken according to package directions if there are no bowel movements after 48 hours.   5. Watch out for diarrhea.  If you have many loose bowel movements, simplify your diet to bland foods & liquids for a few days.  Stop any stool softeners and decrease your fiber supplement.  Switching to mild anti-diarrheal medications (Kayopectate, Pepto Bismol) can help.  If this worsens or does not improve, please call us. 6. Wash / shower every day.  You may shower over the incision / wound.  Avoid baths until the skin is fully healed.  Continue to shower over incision(s) after the dressing is off. 7. Keep a dry dressing over your wound.  Wash gently with soap and water daily.  Do not sit on hard surfaces while it is healing 8. ACTIVITIES as tolerated:   a. You may resume regular (light) daily activities beginning the next day--such as daily self-care, walking, climbing stairs--gradually increasing activities as tolerated.  If you can walk  30 minutes without difficulty, it is safe to try more intense activity such as jogging, treadmill, bicycling, low-impact aerobics, swimming, etc. b. Save the most intensive and strenuous activity for last such as sit-ups, heavy lifting, contact sports, etc  Refrain from any heavy lifting or straining until you are off narcotics for pain control.   c. DO NOT PUSH THROUGH PAIN.  Let pain be your guide: If it hurts to do  something, don't do it.  Pain is your body warning you to avoid that activity for another week until the pain goes down. d. You may drive when you are no longer taking prescription pain medication, you can comfortably wear a seatbelt, and you can safely maneuver your car and apply brakes. e. Dennis Bast may have sexual intercourse when it is comfortable.  9. FOLLOW UP in our office a. Please call [facility] at [phone] to set up an appointment to see your surgeon in the office for a follow-up appointment approximately 1-2 weeks after your surgery. b. Make sure that you call for this appointment the day you arrive home to insure a convenient appointment time. 10. IF YOU HAVE DISABILITY OR FAMILY LEAVE FORMS, BRING THEM TO THE OFFICE FOR PROCESSING.  DO NOT GIVE THEM TO YOUR DOCTOR.   WHEN TO CALL us [phone]: 1. Poor pain control 2. Reactions / problems with new medications (rash/itching, nausea, etc)  3. Fever over 101.5 F (38.5 C) 4. Inability to urinate 5. Nausea and/or vomiting 6. Worsening swelling or bruising 7. Continued bleeding from incision. 8. Increased pain, redness, or drainage from the incision  The clinic staff is available to answer your questions during regular business hours (8:30am-5pm).  Please don't hesitate to call and ask to speak to one of our nurses for clinical concerns.   A surgeon from [facility] is always on call at the hospitals   If you have a medical emergency, go to the nearest emergency room or call 911.    [facility], [place], [place], [place], [place]  [address] ? MAIN: [phone] ? TOLL FREE: [phone] ? FAX [phone] [email or web address]

## 2020-08-27 ENCOUNTER — Other Ambulatory Visit (HOSPITAL_COMMUNITY): Payer: Self-pay

## 2020-08-30 LAB — SURGICAL PATHOLOGY

## 2020-09-03 ENCOUNTER — Other Ambulatory Visit (HOSPITAL_COMMUNITY): Payer: Self-pay

## 2020-09-05 ENCOUNTER — Other Ambulatory Visit: Payer: Self-pay

## 2020-09-05 NOTE — Progress Notes (Signed)
The proposed treatment discussed in conference is for discussion purposes only and is not a binding recommendation.  The patients have not been physically examined, or presented with their treatment options.  Therefore, final treatment plans cannot be decided.   

## 2020-09-26 ENCOUNTER — Inpatient Hospital Stay: Payer: Medicaid Other

## 2020-09-26 ENCOUNTER — Inpatient Hospital Stay: Payer: Medicaid Other | Admitting: Hematology and Oncology

## 2020-09-27 ENCOUNTER — Telehealth: Payer: Self-pay | Admitting: Hematology and Oncology

## 2020-09-27 NOTE — Telephone Encounter (Signed)
Patient called to rescheduled 5/18 Labs, Follow Up w/Kelli to 5/23

## 2020-10-01 ENCOUNTER — Encounter: Payer: Self-pay | Admitting: Hematology and Oncology

## 2020-10-01 ENCOUNTER — Inpatient Hospital Stay (INDEPENDENT_AMBULATORY_CARE_PROVIDER_SITE_OTHER): Payer: Medicaid Other | Admitting: Hematology and Oncology

## 2020-10-01 ENCOUNTER — Other Ambulatory Visit: Payer: Self-pay

## 2020-10-01 ENCOUNTER — Encounter: Payer: Self-pay | Admitting: Oncology

## 2020-10-01 ENCOUNTER — Inpatient Hospital Stay: Payer: Medicaid Other | Attending: Oncology

## 2020-10-01 VITALS — BP 139/68 | HR 81 | Temp 99.0°F | Resp 18 | Ht 60.0 in | Wt 173.5 lb

## 2020-10-01 DIAGNOSIS — E876 Hypokalemia: Secondary | ICD-10-CM | POA: Diagnosis not present

## 2020-10-01 DIAGNOSIS — E119 Type 2 diabetes mellitus without complications: Secondary | ICD-10-CM | POA: Insufficient documentation

## 2020-10-01 DIAGNOSIS — Z79899 Other long term (current) drug therapy: Secondary | ICD-10-CM | POA: Diagnosis not present

## 2020-10-01 DIAGNOSIS — Z90721 Acquired absence of ovaries, unilateral: Secondary | ICD-10-CM | POA: Insufficient documentation

## 2020-10-01 DIAGNOSIS — C20 Malignant neoplasm of rectum: Secondary | ICD-10-CM

## 2020-10-01 DIAGNOSIS — Z7982 Long term (current) use of aspirin: Secondary | ICD-10-CM | POA: Diagnosis not present

## 2020-10-01 DIAGNOSIS — Z9079 Acquired absence of other genital organ(s): Secondary | ICD-10-CM | POA: Diagnosis not present

## 2020-10-01 DIAGNOSIS — I10 Essential (primary) hypertension: Secondary | ICD-10-CM | POA: Diagnosis not present

## 2020-10-01 DIAGNOSIS — Z90710 Acquired absence of both cervix and uterus: Secondary | ICD-10-CM | POA: Insufficient documentation

## 2020-10-01 DIAGNOSIS — Z5111 Encounter for antineoplastic chemotherapy: Secondary | ICD-10-CM | POA: Insufficient documentation

## 2020-10-01 DIAGNOSIS — E039 Hypothyroidism, unspecified: Secondary | ICD-10-CM | POA: Diagnosis not present

## 2020-10-01 LAB — BASIC METABOLIC PANEL
BUN: 18 (ref 4–21)
CO2: 32 — AB (ref 13–22)
Chloride: 97 — AB (ref 99–108)
Creatinine: 0.6 (ref 0.5–1.1)
Glucose: 141
Potassium: 3.9 (ref 3.4–5.3)
Sodium: 137 (ref 137–147)

## 2020-10-01 LAB — CBC AND DIFFERENTIAL
HCT: 36 (ref 36–46)
Hemoglobin: 11.9 — AB (ref 12.0–16.0)
Neutrophils Absolute: 4.73
Platelets: 396 (ref 150–399)
WBC: 6.3

## 2020-10-01 LAB — CBC: RBC: 4.28 (ref 3.87–5.11)

## 2020-10-01 LAB — HEPATIC FUNCTION PANEL
ALT: 11 (ref 7–35)
AST: 21 (ref 13–35)
Alkaline Phosphatase: 69 (ref 25–125)
Bilirubin, Total: 0.5

## 2020-10-01 LAB — COMPREHENSIVE METABOLIC PANEL
Albumin: 4.6 (ref 3.5–5.0)
Calcium: 9.5 (ref 8.7–10.7)

## 2020-10-01 MED ORDER — HYDROCODONE-ACETAMINOPHEN 7.5-325 MG PO TABS: 1.0000 | ORAL_TABLET | Freq: Four times a day (QID) | ORAL | 0 refills | Status: DC | PRN

## 2020-10-01 NOTE — Progress Notes (Signed)
[name]  [address] [place],  [place]  [address] [phone]  Clinic Day:  10/01/2020  Referring physician: Kruppenbach, Katherine *   CHIEF COMPLAINT:  CC:   Clinical stage IIIB rectal cancer  Current Treatment:   Plan for adjuvant FOLFOX chemotherapy every 2 weeks for 8 cycles   HISTORY OF PRESENT ILLNESS:  Tanya Mcclure is a 60 y.o. female with clinical stage IIIB (T3N1bM0) rectal cancer diagnosed in December 2021.  She presented with daily rectal bleeding with bright red blood, chronic diarrhea and stool urgency.  She saw Dr. Butler and underwent colonoscopy in December. This revealed a rectal mass beginning at about 2 cm from the anal verge and was noted to obstruct 50% of the circumference of the rectum.  Benign polyps were also present and removed.  She had never undergone colonoscopy prior to this procedure. Biopsy revealed invasive adenocarcinoma. EKG D revealed an ulcer, so ibuprofen was discontinued and she was placed on pantoprazole 40 mg twice daily. CT abdomen and pelvis revealed several small perirectal and presacral lymph nodes, highly concerning for metastatic involvement.  No other evidence of metastatic disease was observed.  She was seen by Dr. Morgan in consultation to discuss surgical resection and recommended neoadjuvant chemoradiation.  She completed neoadjuvant chemoradiation with infusional 5 fluorouracil in February.  She developed severe hypocalcemia while on treatment requiring oral and IV replacement.  After neoadjuvant treatment she was referred to Dr. Thomas in [place] for possible low anterior resection. MRI after neoadjuvant therapy revealed a 5.8 cm circumferential distal rectal tumor abutting the internal anal sphincter. Bulky residual tumor posteriorly with gross perirectal extension along the left posterior aspect, abutting/involving the left levator ani muscle.  Due to the tumor involving the pelvic floor muscles  and abutting the anal sphincter, abdominoperitoneal resection was recommended.  INTERVAL HISTORY:  Tanya Mcclure is here today for repeat clinical assessment. She underwent abdominoperitoneal resection on April 13th.  Pathology revealed a residual 3 cm invasive adenocarcinoma extending into the perirectal connective tissue.  Nineteen lymph nodes were negative for metastasis. Margins were negative and no lymphovascular or perineural invasion was seen. She states her bowels have been moving well.  She has been having intermittent bleeding from the ostomy. She denies fevers or chills. She reports pain at the incision site and requests a refill of her hydrocodone/APAP 7.5/325 today. Her appetite is good. Her weight has been stable. She states she saw Dr. Thomas today and the incision is open slightly.  Dr. Thomas changed her ostomy bag and she has had some bleeding since.  REVIEW OF SYSTEMS:  Review of Systems  Constitutional: Negative for appetite change, chills, fatigue, fever and unexpected weight change.  HENT:   Negative for lump/mass, mouth sores and sore throat.   Respiratory: Negative for cough and shortness of breath.   Cardiovascular: Negative for chest pain and leg swelling.  Gastrointestinal: Negative for abdominal pain, constipation, diarrhea, nausea and vomiting.  Endocrine: Negative for hot flashes.  Genitourinary: Negative for difficulty urinating, dysuria, frequency and hematuria.   Musculoskeletal: Negative for arthralgias, back pain and myalgias.  Skin: Negative for rash.  Neurological: Negative for dizziness and headaches.  Hematological: Negative for adenopathy. Does not bruise/bleed easily.  Psychiatric/Behavioral: Negative for depression and sleep disturbance. The patient is not nervous/anxious.     VITALS:  Blood pressure 139/68, pulse 81, temperature 99 F (37.2 C), temperature source Oral, resp. rate 18, height 5' (1.524 m), weight 173 lb 8 oz (78.7 kg),   SpO2 98 %.  Wt Readings  from Last 3 Encounters:  10/01/20 173 lb 8 oz (78.7 kg)  08/22/20 177 lb (80.3 kg)  08/14/20 177 lb (80.3 kg)    Body mass index is 33.88 kg/m.  Performance status (ECOG): 1 - Symptomatic but completely ambulatory  PHYSICAL EXAM:  Physical Exam Vitals and nursing note reviewed.  Constitutional:      General: She is not in acute distress.    Appearance: Normal appearance.  HENT:     Head: Normocephalic and atraumatic.     Mouth/Throat:     Mouth: Mucous membranes are moist.     Pharynx: Oropharynx is clear. No oropharyngeal exudate or posterior oropharyngeal erythema.  Eyes:     General: No scleral icterus.    Extraocular Movements: Extraocular movements intact.     Conjunctiva/sclera: Conjunctivae normal.     Pupils: Pupils are equal, round, and reactive to light.  Cardiovascular:     Rate and Rhythm: Normal rate and regular rhythm.     Heart sounds: Normal heart sounds. No murmur heard. No friction rub. No gallop.   Pulmonary:     Effort: Pulmonary effort is normal.     Breath sounds: Normal breath sounds. No wheezing, rhonchi or rales.  Abdominal:     General: There is no distension.     Palpations: Abdomen is soft. There is no hepatomegaly, splenomegaly or mass.     Tenderness: There is no abdominal tenderness.     Comments:   There is bleeding from the ostomy site  Genitourinary:    Comments:  Anal incision looks clean.  There is about a 1 cm open area superiorly. Musculoskeletal:        General: Normal range of motion.     Cervical back: Normal range of motion and neck supple. No tenderness.     Right lower leg: No edema.     Left lower leg: No edema.  Lymphadenopathy:     Cervical: No cervical adenopathy.  Skin:    General: Skin is warm and dry.     Coloration: Skin is not jaundiced.     Findings: No rash.  Neurological:     Mental Status: She is alert and oriented to person, place, and time.     Cranial Nerves: No cranial nerve deficit.  Psychiatric:         Mood and Affect: Mood normal.        Behavior: Behavior normal.        Thought Content: Thought content normal.    LABS:   CBC Latest Ref Rng & Units 10/01/2020 08/25/2020 08/24/2020  WBC - 6.3 7.9 6.9  Hemoglobin 12.0 - 16.0 11.9(A) 13.2 11.3(L)  Hematocrit 36 - 46 36 41.2 35.1(L)  Platelets 150 - 399 396 280 254   CMP Latest Ref Rng & Units 10/01/2020 08/25/2020 08/24/2020  Glucose 70 - 99 mg/dL - 137(H) 173(H)  BUN 4 - 21 18 14 10  Creatinine 0.5 - 1.1 0.6 0.72 0.60  Sodium 137 - 147 137 137 134(L)  Potassium 3.4 - 5.3 3.9 3.5 3.4(L)  Chloride 99 - 108 97(A) 98 100  CO2 13 - 22 32(A) 27 28  Calcium 8.7 - 10.7 9.5 9.3 8.6(L)  Total Protein 6.3 - 8.2 g/dL - - -  Alkaline Phos 25 - 125 69 - -  AST 13 - 35 21 - -  ALT 7 - 35 11 - -     Lab Results  Component Value Date     CEA1 18.0 (H) 07/13/2020   /  CEA  Date Value Ref Range Status  07/13/2020 18.0 (H) 0.0 - 4.7 ng/mL Final    Comment:    (NOTE)                             Nonsmokers          <3.9                             Smokers             <5.6 [employer] Electrochemiluminescence Immunoassay (ECLIA) Values obtained with different assay methods or kits cannot be used interchangeably.  Results cannot be interpreted as absolute evidence of the presence or absence of malignant disease. Performed At: [facility] [place], [place] [id number] Rush Farmer MD BJ:[id number]    No results found for: PSA1 No results found for: CAN199 No results found for: CAN125  No results found for: TOTALPROTELP, ALBUMINELP, A1GS, A2GS, BETS, BETA2SER, GAMS, MSPIKE, SPEI No results found for: TIBC, FERRITIN, IRONPCTSAT No results found for: LDH  STUDIES:  No results found.    HISTORY:   Past Medical History:  Diagnosis Date  . Arthritis   . Asthma   . Cancer ([place]) 04/2020   colon  . Diabetes mellitus without complication ([place])   . Dyspnea    walking,  . Family history of adverse reaction  to anesthesia    daughter PONV  . GERD (gastroesophageal reflux disease)   . Hypertension   . Hypothyroidism   . Thyroid disease     Past Surgical History:  Procedure Laterality Date  . ABDOMINAL HYSTERECTOMY     due to fibroids  . CHOLECYSTECTOMY    . HYSTERECTOMY ABDOMINAL WITH SALPINGO-OOPHORECTOMY    . POLYPECTOMY    . PORTA CATH INSERTION    . TUBAL LIGATION    . XI ROBOTIC ASSISTED LOWER ANTERIOR RESECTION N/A 08/22/2020   Procedure: XI ROBOTIC [place];  Surgeon: Leighton Ruff, MD;  Location: [facility];  Service: General;  Laterality: N/A;  4 HOURS    Family History  Problem Relation Age of Onset  . Diabetes Mother   . Hypertension Mother   . Heart disease Mother   . Heart attack Mother   . Diabetes Father   . Hypertension Father   . Diabetes Sister   . COPD Sister     Social History:  reports that she has never smoked. She has never used smokeless tobacco. She reports previous alcohol use. She reports that she does not use drugs.The patient is alone today.  Allergies:  Allergies  Allergen Reactions  . Penicillins Rash    Current Medications: Current Outpatient Medications  Medication Sig Dispense Refill  . Accu-Chek Softclix Lancets lancets USE TO CHECK BLOOD GLUCOSE TWICE DAILY.    Marland Kitchen albuterol (PROVENTIL) (2.5 MG/3ML) 0.083% nebulizer solution Take 2.5 mg by nebulization every 6 (six) hours as needed for wheezing or shortness of breath.    Marland Kitchen albuterol (VENTOLIN HFA) 108 (90 Base) MCG/ACT inhaler Inhale 2 puffs into the lungs every 6 (six) hours as needed for shortness of breath or wheezing.    Marland Kitchen aspirin 81 MG EC tablet Take 81 mg by mouth daily. (Patient not taking: No sig reported)    . atorvastatin (LIPITOR) 40 MG tablet Take 40 mg by mouth daily.    Marland Kitchen  cetirizine (ZYRTEC) 10 MG tablet Take 10 mg by mouth daily as needed for allergies.    . dicyclomine (BENTYL) 10 MG capsule Take 1 capsule (10 mg total) by mouth 4 (four) times daily -   before meals and at bedtime. (Patient taking differently: Take 10 mg by mouth 4 (four) times daily as needed for spasms.) 90 capsule 0  . diphenoxylate-atropine (LOMOTIL) 2.5-0.025 MG tablet Take 2 tablets by mouth 4 (four) times daily as needed for diarrhea or loose stools. 60 tablet 0  . furosemide (LASIX) 20 MG tablet Take 20 mg by mouth daily.    . gabapentin (NEURONTIN) 300 MG capsule Take 300 mg by mouth 2 (two) times daily.    . glucose blood (ACCU-CHEK AVIVA PLUS) test strip Check blood glucose twice daily and as needed. Dx E 11.65    . HYDROcodone-acetaminophen (NORCO) 7.5-325 MG tablet Take 1 tablet by mouth every 6 (six) hours as needed for moderate pain. 60 tablet 0  . Lancets Misc. (ACCU-CHEK FASTCLIX LANCET) KIT Check blood glucose twice daily. Dx E 11.65    . levothyroxine (SYNTHROID) 100 MCG tablet Take 100 mcg by mouth daily before breakfast.    . metFORMIN (GLUCOPHAGE-XR) 500 MG 24 hr tablet Take 500 mg by mouth 2 (two) times daily with a meal.    . montelukast (SINGULAIR) 10 MG tablet Take 10 mg by mouth daily.    . ondansetron (ZOFRAN) 4 MG tablet Take 1 tablet (4 mg total) by mouth every 4 (four) hours as needed for nausea. 90 tablet 3  . pantoprazole (PROTONIX) 40 MG tablet Take 40 mg by mouth 2 (two) times daily.    . potassium chloride SA (KLOR-CON) 20 MEQ tablet Take 1 tablet (20 mEq total) by mouth 2 (two) times daily. 60 tablet 3  . prochlorperazine (COMPAZINE) 10 MG tablet Take 1 tablet (10 mg total) by mouth every 6 (six) hours as needed for nausea or vomiting. 90 tablet 3   Current Facility-Administered Medications  Medication Dose Route Frequency Provider Last Rate Last Admin  . triamcinolone acetonide (KENALOG) 10 MG/ML injection 10 mg  10 mg Other Once Stover, Titorya, DPM      . triamcinolone acetonide (KENALOG-40) injection 20 mg  20 mg Other Once Stover, Titorya, DPM         ASSESSMENT & PLAN:   Assessment: 1.  Clinical stage IIIB (T3N1bM0) rectal cancer,  based on CT reveal any several small perirectal and presacral lymph nodes, which were highly concerning for metastatic involvement. She underwent abdominoperitonealresection about 6 weeks ago and lymph nodes were negative.  However, we still recommend adjuvant chemotherapy with FOLFOX for 8 cycles.  I will have her meet with Melissa for chemotherapy education and get chemotherapy started as soon as possible. I contacted Dr. Thomas and she felt patient was healed enough to start chemotherapy.  2.  Benign polyps, removed at time of colonoscopy.  3.  History of peptic ulcer disease.  4.  Hypokalemia, resolved with oral supplementation.  She remains on furosemide, so she will continue the potassium.  Plan:    She will see Melissa for chemotherapy education on May 26th and then start adjuvant chemotherapy with FOLFOX next week. I will plan to see her back the following week with a CBC and comprehensive metabolic panel to see how she tolerated her 1st cycle. The patient understands the plans discussed today and is in agreement with them.  She knows to contact our office if she develops concerns prior   to her next appointment.     Marvia Pickles, PA-C

## 2020-10-02 ENCOUNTER — Encounter: Payer: Self-pay | Admitting: Oncology

## 2020-10-02 LAB — CEA: CEA: 1.1 ng/mL (ref 0.0–4.7)

## 2020-10-03 ENCOUNTER — Telehealth: Payer: Self-pay | Admitting: Hematology and Oncology

## 2020-10-03 NOTE — Telephone Encounter (Signed)
10/03/20 Left VM about upcoming chemo appt

## 2020-10-04 ENCOUNTER — Encounter: Payer: Self-pay | Admitting: Oncology

## 2020-10-04 ENCOUNTER — Inpatient Hospital Stay: Payer: Medicaid Other | Admitting: Hematology and Oncology

## 2020-10-05 ENCOUNTER — Encounter: Payer: Self-pay | Admitting: Oncology

## 2020-10-05 ENCOUNTER — Other Ambulatory Visit: Payer: Self-pay | Admitting: Oncology

## 2020-10-09 ENCOUNTER — Other Ambulatory Visit: Payer: Self-pay

## 2020-10-09 ENCOUNTER — Inpatient Hospital Stay: Payer: Medicaid Other

## 2020-10-09 DIAGNOSIS — Z5111 Encounter for antineoplastic chemotherapy: Secondary | ICD-10-CM | POA: Diagnosis not present

## 2020-10-09 DIAGNOSIS — C20 Malignant neoplasm of rectum: Secondary | ICD-10-CM

## 2020-10-09 MED ORDER — DEXTROSE 5 % IV SOLN
Freq: Once | INTRAVENOUS | Status: AC
  Filled 2020-10-09: qty 250

## 2020-10-09 MED ORDER — PALONOSETRON HCL INJECTION 0.25 MG/5ML
0.2500 mg | Freq: Once | INTRAVENOUS | Status: AC
  Administered 2020-10-09: 0.25 mg via INTRAVENOUS

## 2020-10-09 MED ORDER — PALONOSETRON HCL INJECTION 0.25 MG/5ML
INTRAVENOUS | Status: AC
  Filled 2020-10-09: qty 5

## 2020-10-09 MED ORDER — SODIUM CHLORIDE 0.9 % IV SOLN
10.0000 mg | Freq: Once | INTRAVENOUS | Status: AC
  Administered 2020-10-09: 10 mg via INTRAVENOUS
  Filled 2020-10-09: qty 10

## 2020-10-09 MED ORDER — OXALIPLATIN CHEMO INJECTION 100 MG/20ML
83.0000 mg/m2 | Freq: Once | INTRAVENOUS | Status: AC
  Administered 2020-10-09: 150 mg via INTRAVENOUS
  Filled 2020-10-09: qty 20

## 2020-10-09 MED ORDER — FLUOROURACIL CHEMO INJECTION 2.5 GM/50ML
400.0000 mg/m2 | Freq: Once | INTRAVENOUS | Status: AC
  Administered 2020-10-09: 750 mg via INTRAVENOUS
  Filled 2020-10-09: qty 15

## 2020-10-09 MED ORDER — LEUCOVORIN CALCIUM INJECTION 350 MG
400.0000 mg/m2 | Freq: Once | INTRAVENOUS | Status: AC
  Administered 2020-10-09: 732 mg via INTRAVENOUS
  Filled 2020-10-09: qty 36.6

## 2020-10-09 MED ORDER — SODIUM CHLORIDE 0.9 % IV SOLN
2400.0000 mg/m2 | INTRAVENOUS | Status: DC
  Administered 2020-10-09: 4400 mg via INTRAVENOUS
  Filled 2020-10-09: qty 88

## 2020-10-09 NOTE — Patient Instructions (Signed)
Fluorouracil, 5-FU injection What is this medicine? FLUOROURACIL, 5-FU (flure oh YOOR a sil) is a chemotherapy drug. It slows the growth of cancer cells. This medicine is used to treat many types of cancer like breast cancer, colon or rectal cancer, pancreatic cancer, and stomach cancer. This medicine may be used for other purposes; ask your health care provider or pharmacist if you have questions. COMMON BRAND NAME(S): Adrucil What should I tell my health care provider before I take this medicine? They need to know if you have any of these conditions:  blood disorders  dihydropyrimidine dehydrogenase (DPD) deficiency  infection (especially a virus infection such as chickenpox, cold sores, or herpes)  kidney disease  liver disease  malnourished, poor nutrition  recent or ongoing radiation therapy  an unusual or allergic reaction to fluorouracil, other chemotherapy, other medicines, foods, dyes, or preservatives  pregnant or trying to get pregnant  breast-feeding How should I use this medicine? This drug is given as an infusion or injection into a vein. It is administered in a hospital or clinic by a specially trained health care professional. Talk to your pediatrician regarding the use of this medicine in children. Special care may be needed. Overdosage: If you think you have taken too much of this medicine contact a poison control center or emergency room at once. NOTE: This medicine is only for you. Do not share this medicine with others. What if I miss a dose? It is important not to miss your dose. Call your doctor or health care professional if you are unable to keep an appointment. What may interact with this medicine? Do not take this medicine with any of the following medications:  live virus vaccines This medicine may also interact with the following medications:  medicines that treat or prevent blood clots like warfarin, enoxaparin, and dalteparin This list may not  describe all possible interactions. Give your health care provider a list of all the medicines, herbs, non-prescription drugs, or dietary supplements you use. Also tell them if you smoke, drink alcohol, or use illegal drugs. Some items may interact with your medicine. What should I watch for while using this medicine? Visit your doctor for checks on your progress. This drug may make you feel generally unwell. This is not uncommon, as chemotherapy can affect healthy cells as well as cancer cells. Report any side effects. Continue your course of treatment even though you feel ill unless your doctor tells you to stop. In some cases, you may be given additional medicines to help with side effects. Follow all directions for their use. Call your doctor or health care professional for advice if you get a fever, chills or sore throat, or other symptoms of a cold or flu. Do not treat yourself. This drug decreases your body's ability to fight infections. Try to avoid being around people who are sick. This medicine may increase your risk to bruise or bleed. Call your doctor or health care professional if you notice any unusual bleeding. Be careful brushing and flossing your teeth or using a toothpick because you may get an infection or bleed more easily. If you have any dental work done, tell your dentist you are receiving this medicine. Avoid taking products that contain aspirin, acetaminophen, ibuprofen, naproxen, or ketoprofen unless instructed by your doctor. These medicines may hide a fever. Do not become pregnant while taking this medicine. Women should inform their doctor if they wish to become pregnant or think they might be pregnant. There is a potential   for serious side effects to an unborn child. Talk to your health care professional or pharmacist for more information. Do not breast-feed an infant while taking this medicine. Men should inform their doctor if they wish to father a child. This medicine may  lower sperm counts. Do not treat diarrhea with over the counter products. Contact your doctor if you have diarrhea that lasts more than 2 days or if it is severe and watery. This medicine can make you more sensitive to the sun. Keep out of the sun. If you cannot avoid being in the sun, wear protective clothing and use sunscreen. Do not use sun lamps or tanning beds/booths. What side effects may I notice from receiving this medicine? Side effects that you should report to your doctor or health care professional as soon as possible:  allergic reactions like skin rash, itching or hives, swelling of the face, lips, or tongue  low blood counts - this medicine may decrease the number of white blood cells, red blood cells and platelets. You may be at increased risk for infections and bleeding.  signs of infection - fever or chills, cough, sore throat, pain or difficulty passing urine  signs of decreased platelets or bleeding - bruising, pinpoint red spots on the skin, black, tarry stools, blood in the urine  signs of decreased red blood cells - unusually weak or tired, fainting spells, lightheadedness  breathing problems  changes in vision  chest pain  mouth sores  nausea and vomiting  pain, swelling, redness at site where injected  pain, tingling, numbness in the hands or feet  redness, swelling, or sores on hands or feet  stomach pain  unusual bleeding Side effects that usually do not require medical attention (report to your doctor or health care professional if they continue or are bothersome):  changes in finger or toe nails  diarrhea  dry or itchy skin  hair loss  headache  loss of appetite  sensitivity of eyes to the light  stomach upset  unusually teary eyes This list may not describe all possible side effects. Call your doctor for medical advice about side effects. You may report side effects to FDA at 1-800-FDA-1088. Where should I keep my medicine? This  drug is given in a hospital or clinic and will not be stored at home. NOTE: This sheet is a summary. It may not cover all possible information. If you have questions about this medicine, talk to your doctor, pharmacist, or health care provider.  2021 [employer] Standard (2019-03-29 15:00:03) Leucovorin injection What is this medicine? LEUCOVORIN (loo koe VOR in) is used to prevent or treat the harmful effects of some medicines. This medicine is used to treat anemia caused by a low amount of folic acid in the body. It is also used with 5-fluorouracil (5-FU) to treat colon cancer. This medicine may be used for other purposes; ask your health care provider or pharmacist if you have questions. What should I tell my health care provider before I take this medicine? They need to know if you have any of these conditions:  anemia from low levels of vitamin B-12 in the blood  an unusual or allergic reaction to leucovorin, folic acid, other medicines, foods, dyes, or preservatives  pregnant or trying to get pregnant  breast-feeding How should I use this medicine? This medicine is for injection into a muscle or into a vein. It is given by a health care professional in a hospital or clinic setting. Talk to your pediatrician   regarding the use of this medicine in children. Special care may be needed. Overdosage: If you think you have taken too much of this medicine contact a poison control center or emergency room at once. NOTE: This medicine is only for you. Do not share this medicine with others. What if I miss a dose? This does not apply. What may interact with this medicine?  capecitabine  fluorouracil  phenobarbital  phenytoin  primidone  trimethoprim-sulfamethoxazole This list may not describe all possible interactions. Give your health care provider a list of all the medicines, herbs, non-prescription drugs, or dietary supplements you use. Also tell them if you smoke, drink alcohol,  or use illegal drugs. Some items may interact with your medicine. What should I watch for while using this medicine? Your condition will be monitored carefully while you are receiving this medicine. This medicine may increase the side effects of 5-fluorouracil, 5-FU. Tell your doctor or health care professional if you have diarrhea or mouth sores that do not get better or that get worse. What side effects may I notice from receiving this medicine? Side effects that you should report to your doctor or health care professional as soon as possible:  allergic reactions like skin rash, itching or hives, swelling of the face, lips, or tongue  breathing problems  fever, infection  mouth sores  unusual bleeding or bruising  unusually weak or tired Side effects that usually do not require medical attention (report to your doctor or health care professional if they continue or are bothersome):  constipation or diarrhea  loss of appetite  nausea, vomiting This list may not describe all possible side effects. Call your doctor for medical advice about side effects. You may report side effects to FDA at 1-800-FDA-1088. Where should I keep my medicine? This drug is given in a hospital or clinic and will not be stored at home. NOTE: This sheet is a summary. It may not cover all possible information. If you have questions about this medicine, talk to your doctor, pharmacist, or health care provider.  2021 Elsevier/Gold Standard (2007-11-02 16:50:29) Oxaliplatin Injection What is this medicine? OXALIPLATIN (ox AL i PLA tin) is a chemotherapy drug. It targets fast dividing cells, like cancer cells, and causes these cells to die. This medicine is used to treat cancers of the colon and rectum, and many other cancers. This medicine may be used for other purposes; ask your health care provider or pharmacist if you have questions. COMMON BRAND NAME(S): Eloxatin What should I tell my health care provider  before I take this medicine? They need to know if you have any of these conditions:  heart disease  history of irregular heartbeat  liver disease  low blood counts, like white cells, platelets, or red blood cells  lung or breathing disease, like asthma  take medicines that treat or prevent blood clots  tingling of the fingers or toes, or other nerve disorder  an unusual or allergic reaction to oxaliplatin, other chemotherapy, other medicines, foods, dyes, or preservatives  pregnant or trying to get pregnant  breast-feeding How should I use this medicine? This drug is given as an infusion into a vein. It is administered in a hospital or clinic by a specially trained health care professional. Talk to your pediatrician regarding the use of this medicine in children. Special care may be needed. Overdosage: If you think you have taken too much of this medicine contact a poison control center or emergency room at once. NOTE: This medicine   is only for you. Do not share this medicine with others. What if I miss a dose? It is important not to miss a dose. Call your doctor or health care professional if you are unable to keep an appointment. What may interact with this medicine? Do not take this medicine with any of the following medications:  cisapride  dronedarone  pimozide  thioridazine This medicine may also interact with the following medications:  aspirin and aspirin-like medicines  certain medicines that treat or prevent blood clots like warfarin, apixaban, dabigatran, and rivaroxaban  cisplatin  cyclosporine  diuretics  medicines for infection like acyclovir, adefovir, amphotericin B, bacitracin, cidofovir, foscarnet, ganciclovir, gentamicin, pentamidine, vancomycin  NSAIDs, medicines for pain and inflammation, like ibuprofen or naproxen  other medicines that prolong the QT interval (an abnormal heart rhythm)  pamidronate  zoledronic acid This list may not  describe all possible interactions. Give your health care provider a list of all the medicines, herbs, non-prescription drugs, or dietary supplements you use. Also tell them if you smoke, drink alcohol, or use illegal drugs. Some items may interact with your medicine. What should I watch for while using this medicine? Your condition will be monitored carefully while you are receiving this medicine. You may need blood work done while you are taking this medicine. This medicine may make you feel generally unwell. This is not uncommon as chemotherapy can affect healthy cells as well as cancer cells. Report any side effects. Continue your course of treatment even though you feel ill unless your healthcare professional tells you to stop. This medicine can make you more sensitive to cold. Do not drink cold drinks or use ice. Cover exposed skin before coming in contact with cold temperatures or cold objects. When out in cold weather wear warm clothing and cover your mouth and nose to warm the air that goes into your lungs. Tell your doctor if you get sensitive to the cold. Do not become pregnant while taking this medicine or for 9 months after stopping it. Women should inform their health care professional if they wish to become pregnant or think they might be pregnant. Men should not father a child while taking this medicine and for 6 months after stopping it. There is potential for serious side effects to an unborn child. Talk to your health care professional for more information. Do not breast-feed a child while taking this medicine or for 3 months after stopping it. This medicine has caused ovarian failure in some women. This medicine may make it more difficult to get pregnant. Talk to your health care professional if you are concerned about your fertility. This medicine has caused decreased sperm counts in some men. This may make it more difficult to father a child. Talk to your health care professional if  you are concerned about your fertility. This medicine may increase your risk of getting an infection. Call your health care professional for advice if you get a fever, chills, or sore throat, or other symptoms of a cold or flu. Do not treat yourself. Try to avoid being around people who are sick. Avoid taking medicines that contain aspirin, acetaminophen, ibuprofen, naproxen, or ketoprofen unless instructed by your health care professional. These medicines may hide a fever. Be careful brushing or flossing your teeth or using a toothpick because you may get an infection or bleed more easily. If you have any dental work done, tell your dentist you are receiving this medicine. What side effects may I notice from receiving   this medicine? Side effects that you should report to your doctor or health care professional as soon as possible:  allergic reactions like skin rash, itching or hives, swelling of the face, lips, or tongue  breathing problems  cough  low blood counts - this medicine may decrease the number of white blood cells, red blood cells, and platelets. You may be at increased risk for infections and bleeding  nausea, vomiting  pain, redness, or irritation at site where injected  pain, tingling, numbness in the hands or feet  signs and symptoms of bleeding such as bloody or black, tarry stools; red or dark brown urine; spitting up blood or brown material that looks like coffee grounds; red spots on the skin; unusual bruising or bleeding from the eyes, gums, or nose  signs and symptoms of a dangerous change in heartbeat or heart rhythm like chest pain; dizziness; fast, irregular heartbeat; palpitations; feeling faint or lightheaded; falls  signs and symptoms of infection like fever; chills; cough; sore throat; pain or trouble passing urine  signs and symptoms of liver injury like dark yellow or brown urine; general ill feeling or flu-like symptoms; light-colored stools; loss of  appetite; nausea; right upper belly pain; unusually weak or tired; yellowing of the eyes or skin  signs and symptoms of low red blood cells or anemia such as unusually weak or tired; feeling faint or lightheaded; falls  signs and symptoms of muscle injury like dark urine; trouble passing urine or change in the amount of urine; unusually weak or tired; muscle pain; back pain Side effects that usually do not require medical attention (report to your doctor or health care professional if they continue or are bothersome):  changes in taste  diarrhea  gas  hair loss  loss of appetite  mouth sores This list may not describe all possible side effects. Call your doctor for medical advice about side effects. You may report side effects to FDA at 1-800-FDA-1088. Where should I keep my medicine? This drug is given in a hospital or clinic and will not be stored at home. NOTE: This sheet is a summary. It may not cover all possible information. If you have questions about this medicine, talk to your doctor, pharmacist, or health care provider.  2021 [employer]vier/Gold Standard (2018-09-15 12:20:35)  

## 2020-10-10 ENCOUNTER — Telehealth: Payer: Self-pay

## 2020-10-10 NOTE — Telephone Encounter (Addendum)
6/3 @ 925am - I spoke with pt's sister, Mickel Baas (emergency contact). She states Sarafina called her this morning to take the kids to school because she was sick. I asked her to please have Allyssia to call me, so I can try to help her. Mickel Baas states as soon as she gets back, she will have [name] call me.   Call attempted to check on pt since infusion, no answer @ home, nor cell numbers.

## 2020-10-11 ENCOUNTER — Inpatient Hospital Stay: Payer: Medicaid Other | Attending: Oncology

## 2020-10-11 ENCOUNTER — Other Ambulatory Visit: Payer: Self-pay

## 2020-10-11 VITALS — BP 141/74 | HR 77 | Temp 98.2°F | Resp 18

## 2020-10-11 DIAGNOSIS — C20 Malignant neoplasm of rectum: Secondary | ICD-10-CM | POA: Insufficient documentation

## 2020-10-11 DIAGNOSIS — D649 Anemia, unspecified: Secondary | ICD-10-CM | POA: Diagnosis not present

## 2020-10-11 DIAGNOSIS — I10 Essential (primary) hypertension: Secondary | ICD-10-CM | POA: Insufficient documentation

## 2020-10-11 DIAGNOSIS — Z79899 Other long term (current) drug therapy: Secondary | ICD-10-CM | POA: Diagnosis not present

## 2020-10-11 DIAGNOSIS — E119 Type 2 diabetes mellitus without complications: Secondary | ICD-10-CM | POA: Diagnosis not present

## 2020-10-11 DIAGNOSIS — Z5111 Encounter for antineoplastic chemotherapy: Secondary | ICD-10-CM | POA: Insufficient documentation

## 2020-10-11 DIAGNOSIS — Z7982 Long term (current) use of aspirin: Secondary | ICD-10-CM | POA: Diagnosis not present

## 2020-10-11 DIAGNOSIS — E039 Hypothyroidism, unspecified: Secondary | ICD-10-CM | POA: Insufficient documentation

## 2020-10-11 DIAGNOSIS — E876 Hypokalemia: Secondary | ICD-10-CM | POA: Diagnosis not present

## 2020-10-11 MED ORDER — HEPARIN SOD (PORK) LOCK FLUSH 100 UNIT/ML IV SOLN
500.0000 [IU] | Freq: Once | INTRAVENOUS | Status: AC | PRN
  Administered 2020-10-11: 500 [IU]
  Filled 2020-10-11: qty 5

## 2020-10-11 MED ORDER — SODIUM CHLORIDE 0.9% FLUSH
10.0000 mL | INTRAVENOUS | Status: DC | PRN
  Administered 2020-10-11: 10 mL
  Filled 2020-10-11: qty 10

## 2020-10-11 NOTE — Patient Instructions (Signed)
Fluorouracil, 5-FU injection What is this medicine? FLUOROURACIL, 5-FU (flure oh YOOR a sil) is a chemotherapy drug. It slows the growth of cancer cells. This medicine is used to treat many types of cancer like breast cancer, colon or rectal cancer, pancreatic cancer, and stomach cancer. This medicine may be used for other purposes; ask your health care provider or pharmacist if you have questions. COMMON BRAND NAME(S): Adrucil What should I tell my health care provider before I take this medicine? They need to know if you have any of these conditions:  blood disorders  dihydropyrimidine dehydrogenase (DPD) deficiency  infection (especially a virus infection such as chickenpox, cold sores, or herpes)  kidney disease  liver disease  malnourished, poor nutrition  recent or ongoing radiation therapy  an unusual or allergic reaction to fluorouracil, other chemotherapy, other medicines, foods, dyes, or preservatives  pregnant or trying to get pregnant  breast-feeding How should I use this medicine? This drug is given as an infusion or injection into a vein. It is administered in a hospital or clinic by a specially trained health care professional. Talk to your pediatrician regarding the use of this medicine in children. Special care may be needed. Overdosage: If you think you have taken too much of this medicine contact a poison control center or emergency room at once. NOTE: This medicine is only for you. Do not share this medicine with others. What if I miss a dose? It is important not to miss your dose. Call your doctor or health care professional if you are unable to keep an appointment. What may interact with this medicine? Do not take this medicine with any of the following medications:  live virus vaccines This medicine may also interact with the following medications:  medicines that treat or prevent blood clots like warfarin, enoxaparin, and dalteparin This list may not  describe all possible interactions. Give your health care provider a list of all the medicines, herbs, non-prescription drugs, or dietary supplements you use. Also tell them if you smoke, drink alcohol, or use illegal drugs. Some items may interact with your medicine. What should I watch for while using this medicine? Visit your doctor for checks on your progress. This drug may make you feel generally unwell. This is not uncommon, as chemotherapy can affect healthy cells as well as cancer cells. Report any side effects. Continue your course of treatment even though you feel ill unless your doctor tells you to stop. In some cases, you may be given additional medicines to help with side effects. Follow all directions for their use. Call your doctor or health care professional for advice if you get a fever, chills or sore throat, or other symptoms of a cold or flu. Do not treat yourself. This drug decreases your body's ability to fight infections. Try to avoid being around people who are sick. This medicine may increase your risk to bruise or bleed. Call your doctor or health care professional if you notice any unusual bleeding. Be careful brushing and flossing your teeth or using a toothpick because you may get an infection or bleed more easily. If you have any dental work done, tell your dentist you are receiving this medicine. Avoid taking products that contain aspirin, acetaminophen, ibuprofen, naproxen, or ketoprofen unless instructed by your doctor. These medicines may hide a fever. Do not become pregnant while taking this medicine. Women should inform their doctor if they wish to become pregnant or think they might be pregnant. There is a potential   for serious side effects to an unborn child. Talk to your health care professional or pharmacist for more information. Do not breast-feed an infant while taking this medicine. Men should inform their doctor if they wish to father a child. This medicine may  lower sperm counts. Do not treat diarrhea with over the counter products. Contact your doctor if you have diarrhea that lasts more than 2 days or if it is severe and watery. This medicine can make you more sensitive to the sun. Keep out of the sun. If you cannot avoid being in the sun, wear protective clothing and use sunscreen. Do not use sun lamps or tanning beds/booths. What side effects may I notice from receiving this medicine? Side effects that you should report to your doctor or health care professional as soon as possible:  allergic reactions like skin rash, itching or hives, swelling of the face, lips, or tongue  low blood counts - this medicine may decrease the number of white blood cells, red blood cells and platelets. You may be at increased risk for infections and bleeding.  signs of infection - fever or chills, cough, sore throat, pain or difficulty passing urine  signs of decreased platelets or bleeding - bruising, pinpoint red spots on the skin, black, tarry stools, blood in the urine  signs of decreased red blood cells - unusually weak or tired, fainting spells, lightheadedness  breathing problems  changes in vision  chest pain  mouth sores  nausea and vomiting  pain, swelling, redness at site where injected  pain, tingling, numbness in the hands or feet  redness, swelling, or sores on hands or feet  stomach pain  unusual bleeding Side effects that usually do not require medical attention (report to your doctor or health care professional if they continue or are bothersome):  changes in finger or toe nails  diarrhea  dry or itchy skin  hair loss  headache  loss of appetite  sensitivity of eyes to the light  stomach upset  unusually teary eyes This list may not describe all possible side effects. Call your doctor for medical advice about side effects. You may report side effects to FDA at 1-800-FDA-1088. Where should I keep my medicine? This  drug is given in a hospital or clinic and will not be stored at home. NOTE: This sheet is a summary. It may not cover all possible information. If you have questions about this medicine, talk to your doctor, pharmacist, or health care provider.  2021 [employer] Standard (2019-03-29 15:00:03) Leucovorin injection What is this medicine? LEUCOVORIN (loo koe VOR in) is used to prevent or treat the harmful effects of some medicines. This medicine is used to treat anemia caused by a low amount of folic acid in the body. It is also used with 5-fluorouracil (5-FU) to treat colon cancer. This medicine may be used for other purposes; ask your health care provider or pharmacist if you have questions. What should I tell my health care provider before I take this medicine? They need to know if you have any of these conditions:  anemia from low levels of vitamin B-12 in the blood  an unusual or allergic reaction to leucovorin, folic acid, other medicines, foods, dyes, or preservatives  pregnant or trying to get pregnant  breast-feeding How should I use this medicine? This medicine is for injection into a muscle or into a vein. It is given by a health care professional in a hospital or clinic setting. Talk to your pediatrician   regarding the use of this medicine in children. Special care may be needed. Overdosage: If you think you have taken too much of this medicine contact a poison control center or emergency room at once. NOTE: This medicine is only for you. Do not share this medicine with others. What if I miss a dose? This does not apply. What may interact with this medicine?  capecitabine  fluorouracil  phenobarbital  phenytoin  primidone  trimethoprim-sulfamethoxazole This list may not describe all possible interactions. Give your health care provider a list of all the medicines, herbs, non-prescription drugs, or dietary supplements you use. Also tell them if you smoke, drink alcohol,  or use illegal drugs. Some items may interact with your medicine. What should I watch for while using this medicine? Your condition will be monitored carefully while you are receiving this medicine. This medicine may increase the side effects of 5-fluorouracil, 5-FU. Tell your doctor or health care professional if you have diarrhea or mouth sores that do not get better or that get worse. What side effects may I notice from receiving this medicine? Side effects that you should report to your doctor or health care professional as soon as possible:  allergic reactions like skin rash, itching or hives, swelling of the face, lips, or tongue  breathing problems  fever, infection  mouth sores  unusual bleeding or bruising  unusually weak or tired Side effects that usually do not require medical attention (report to your doctor or health care professional if they continue or are bothersome):  constipation or diarrhea  loss of appetite  nausea, vomiting This list may not describe all possible side effects. Call your doctor for medical advice about side effects. You may report side effects to FDA at 1-800-FDA-1088. Where should I keep my medicine? This drug is given in a hospital or clinic and will not be stored at home. NOTE: This sheet is a summary. It may not cover all possible information. If you have questions about this medicine, talk to your doctor, pharmacist, or health care provider.  2021 Elsevier/Gold Standard (2007-11-02 16:50:29) Oxaliplatin Injection What is this medicine? OXALIPLATIN (ox AL i PLA tin) is a chemotherapy drug. It targets fast dividing cells, like cancer cells, and causes these cells to die. This medicine is used to treat cancers of the colon and rectum, and many other cancers. This medicine may be used for other purposes; ask your health care provider or pharmacist if you have questions. COMMON BRAND NAME(S): Eloxatin What should I tell my health care provider  before I take this medicine? They need to know if you have any of these conditions:  heart disease  history of irregular heartbeat  liver disease  low blood counts, like white cells, platelets, or red blood cells  lung or breathing disease, like asthma  take medicines that treat or prevent blood clots  tingling of the fingers or toes, or other nerve disorder  an unusual or allergic reaction to oxaliplatin, other chemotherapy, other medicines, foods, dyes, or preservatives  pregnant or trying to get pregnant  breast-feeding How should I use this medicine? This drug is given as an infusion into a vein. It is administered in a hospital or clinic by a specially trained health care professional. Talk to your pediatrician regarding the use of this medicine in children. Special care may be needed. Overdosage: If you think you have taken too much of this medicine contact a poison control center or emergency room at once. NOTE: This medicine   is only for you. Do not share this medicine with others. What if I miss a dose? It is important not to miss a dose. Call your doctor or health care professional if you are unable to keep an appointment. What may interact with this medicine? Do not take this medicine with any of the following medications:  cisapride  dronedarone  pimozide  thioridazine This medicine may also interact with the following medications:  aspirin and aspirin-like medicines  certain medicines that treat or prevent blood clots like warfarin, apixaban, dabigatran, and rivaroxaban  cisplatin  cyclosporine  diuretics  medicines for infection like acyclovir, adefovir, amphotericin B, bacitracin, cidofovir, foscarnet, ganciclovir, gentamicin, pentamidine, vancomycin  NSAIDs, medicines for pain and inflammation, like ibuprofen or naproxen  other medicines that prolong the QT interval (an abnormal heart rhythm)  pamidronate  zoledronic acid This list may not  describe all possible interactions. Give your health care provider a list of all the medicines, herbs, non-prescription drugs, or dietary supplements you use. Also tell them if you smoke, drink alcohol, or use illegal drugs. Some items may interact with your medicine. What should I watch for while using this medicine? Your condition will be monitored carefully while you are receiving this medicine. You may need blood work done while you are taking this medicine. This medicine may make you feel generally unwell. This is not uncommon as chemotherapy can affect healthy cells as well as cancer cells. Report any side effects. Continue your course of treatment even though you feel ill unless your healthcare professional tells you to stop. This medicine can make you more sensitive to cold. Do not drink cold drinks or use ice. Cover exposed skin before coming in contact with cold temperatures or cold objects. When out in cold weather wear warm clothing and cover your mouth and nose to warm the air that goes into your lungs. Tell your doctor if you get sensitive to the cold. Do not become pregnant while taking this medicine or for 9 months after stopping it. Women should inform their health care professional if they wish to become pregnant or think they might be pregnant. Men should not father a child while taking this medicine and for 6 months after stopping it. There is potential for serious side effects to an unborn child. Talk to your health care professional for more information. Do not breast-feed a child while taking this medicine or for 3 months after stopping it. This medicine has caused ovarian failure in some women. This medicine may make it more difficult to get pregnant. Talk to your health care professional if you are concerned about your fertility. This medicine has caused decreased sperm counts in some men. This may make it more difficult to father a child. Talk to your health care professional if  you are concerned about your fertility. This medicine may increase your risk of getting an infection. Call your health care professional for advice if you get a fever, chills, or sore throat, or other symptoms of a cold or flu. Do not treat yourself. Try to avoid being around people who are sick. Avoid taking medicines that contain aspirin, acetaminophen, ibuprofen, naproxen, or ketoprofen unless instructed by your health care professional. These medicines may hide a fever. Be careful brushing or flossing your teeth or using a toothpick because you may get an infection or bleed more easily. If you have any dental work done, tell your dentist you are receiving this medicine. What side effects may I notice from receiving   this medicine? Side effects that you should report to your doctor or health care professional as soon as possible:  allergic reactions like skin rash, itching or hives, swelling of the face, lips, or tongue  breathing problems  cough  low blood counts - this medicine may decrease the number of white blood cells, red blood cells, and platelets. You may be at increased risk for infections and bleeding  nausea, vomiting  pain, redness, or irritation at site where injected  pain, tingling, numbness in the hands or feet  signs and symptoms of bleeding such as bloody or black, tarry stools; red or dark brown urine; spitting up blood or brown material that looks like coffee grounds; red spots on the skin; unusual bruising or bleeding from the eyes, gums, or nose  signs and symptoms of a dangerous change in heartbeat or heart rhythm like chest pain; dizziness; fast, irregular heartbeat; palpitations; feeling faint or lightheaded; falls  signs and symptoms of infection like fever; chills; cough; sore throat; pain or trouble passing urine  signs and symptoms of liver injury like dark yellow or brown urine; general ill feeling or flu-like symptoms; light-colored stools; loss of  appetite; nausea; right upper belly pain; unusually weak or tired; yellowing of the eyes or skin  signs and symptoms of low red blood cells or anemia such as unusually weak or tired; feeling faint or lightheaded; falls  signs and symptoms of muscle injury like dark urine; trouble passing urine or change in the amount of urine; unusually weak or tired; muscle pain; back pain Side effects that usually do not require medical attention (report to your doctor or health care professional if they continue or are bothersome):  changes in taste  diarrhea  gas  hair loss  loss of appetite  mouth sores This list may not describe all possible side effects. Call your doctor for medical advice about side effects. You may report side effects to FDA at 1-800-FDA-1088. Where should I keep my medicine? This drug is given in a hospital or clinic and will not be stored at home. NOTE: This sheet is a summary. It may not cover all possible information. If you have questions about this medicine, talk to your doctor, pharmacist, or health care provider.  2021 [employer]vier/Gold Standard (2018-09-15 12:20:35)  

## 2020-10-16 ENCOUNTER — Encounter: Payer: Self-pay | Admitting: Oncology

## 2020-10-16 ENCOUNTER — Other Ambulatory Visit: Payer: Self-pay

## 2020-10-16 ENCOUNTER — Inpatient Hospital Stay (INDEPENDENT_AMBULATORY_CARE_PROVIDER_SITE_OTHER): Payer: Medicaid Other | Admitting: Hematology and Oncology

## 2020-10-16 ENCOUNTER — Telehealth: Payer: Self-pay | Admitting: Hematology and Oncology

## 2020-10-16 ENCOUNTER — Encounter: Payer: Self-pay | Admitting: Hematology and Oncology

## 2020-10-16 ENCOUNTER — Inpatient Hospital Stay: Payer: Medicaid Other

## 2020-10-16 VITALS — BP 141/70 | HR 77 | Temp 98.9°F | Resp 16 | Ht 60.0 in | Wt 171.5 lb

## 2020-10-16 DIAGNOSIS — C20 Malignant neoplasm of rectum: Secondary | ICD-10-CM

## 2020-10-16 LAB — COMPREHENSIVE METABOLIC PANEL
Albumin: 4.4 (ref 3.5–5.0)
Calcium: 9.3 (ref 8.7–10.7)

## 2020-10-16 LAB — CBC AND DIFFERENTIAL
HCT: 33 — AB (ref 36–46)
Hemoglobin: 11.3 — AB (ref 12.0–16.0)
Neutrophils Absolute: 2.73
Platelets: 279 (ref 150–399)
WBC: 4.2

## 2020-10-16 LAB — HEPATIC FUNCTION PANEL
ALT: 11 (ref 7–35)
AST: 20 (ref 13–35)
Alkaline Phosphatase: 57 (ref 25–125)
Bilirubin, Total: 0.5

## 2020-10-16 LAB — CBC: RBC: 3.94 (ref 3.87–5.11)

## 2020-10-16 LAB — BASIC METABOLIC PANEL
BUN: 18 (ref 4–21)
CO2: 28 — AB (ref 13–22)
Chloride: 99 (ref 99–108)
Creatinine: 0.6 (ref 0.5–1.1)
Glucose: 157
Potassium: 3.8 (ref 3.4–5.3)
Sodium: 138 (ref 137–147)

## 2020-10-16 NOTE — Progress Notes (Signed)
Pt reports for 3 days after chemo, she was really sick. "I had nausea and vomiting. I had to keep trash can by my bed. I only drank about 1/2 of 20 ounce bottle of water those 3 days. The Zofran didn't really help. I was wondering if I could have a stronger dose for next time"?  I told pt I tried multiple times to reach her (7-8) without return call, to check on her. She said, "I heard your voice, and I tried to call back. But I just didn't feel well enough to wait for someone to answer the phone".  I asked her to please call us next time. We could have brought her in for IVF and IV anti-emetics. She said she would.  I gave her a card with my name, number and extension.  I reported all of the above to [facility].

## 2020-10-16 NOTE — Progress Notes (Signed)
[facility]  [address] [place],  [place]  [address] [phone]  Clinic Day:  10/16/2020  Referring physician: Little Ishikawa, MD   CHIEF COMPLAINT:  CC:   Clinical stage IIIB rectal cancer  Current Treatment:   Plan for adjuvant FOLFOX chemotherapy every 2 weeks for 8 cycles   HISTORY OF PRESENT ILLNESS:  Tanya Mcclure is a 60 y.o. female with clinical stage IIIB ([id number]) rectal cancer diagnosed in December 2021.  She presented with daily rectal bleeding with bright red blood, chronic diarrhea and stool urgency.  She saw Dr. Melina Copa and underwent colonoscopy in December. This revealed a rectal mass beginning at about 2 cm from the anal verge and was noted to obstruct 50% of the circumference of the rectum.  Benign polyps were also present and removed.  She had never undergone colonoscopy prior to this procedure. Biopsy revealed invasive adenocarcinoma. EKG D revealed an ulcer, so ibuprofen was discontinued and she was placed on pantoprazole 40 mg twice daily. CT abdomen and pelvis revealed several small perirectal and presacral lymph nodes, highly concerning for metastatic involvement.  No other evidence of metastatic disease was observed.  She was seen by Dr. Lilia Pro in consultation to discuss surgical resection and recommended neoadjuvant chemoradiation.  She completed neoadjuvant chemoradiation with infusional 5 fluorouracil in February.  She developed severe hypocalcemia while on treatment requiring oral and IV replacement.  After neoadjuvant treatment she was referred to Dr. Marcello Moores in [place] for possible low anterior resection. MRI after neoadjuvant therapy revealed a 5.8 cm circumferential distal rectal tumor abutting the internal anal sphincter. Bulky residual tumor posteriorly with gross perirectal extension along the left posterior aspect, abutting/involving the left levator ani muscle.  Due to the tumor involving the pelvic floor muscles and  abutting the anal sphincter, abdominoperitoneal resection was recommended.  She underwent abdominoperitoneal resection on April 13th.  Pathology revealed a residual 3 cm invasive adenocarcinoma extending into the perirectal connective tissue.  Nineteen lymph nodes were negative for metastasis. Margins were negative and no lymphovascular or perineural invasion was seen.   We recommended adjuvant FOLFOX chemotherapy for 8 cycles.  She had her 1st cycle on May 31st.  INTERVAL HISTORY:  Tanya Mcclure is here today for repeat clinical assessment after receiving her 1st cycle of adjuvant FOLFOX chemotherapy.  She had difficulty tolerating treatment due to severe nausea and vomiting for 3 days despite receiving Aloxi IV with her chemotherapy.   She states the Zofran 4 mg and Compazine 10 mg were ineffective.   She was unable to eat or drink during that time. She did not contact our office. She denies diarrhea.  She denies nausea and vomiting in the past 24 hours.  She is drinking plenty of fluids.  She continues to have intermittent bleeding from the ostomy, usually after changing the bag. She reports the pain at the incision site is improving and she is able to sit for longer periods of time.  She still has some clear drainage from the area.  She denies fevers or chills.  Herappetite is improving. Her weight has decreased 2 pounds over last 2 weeks.  REVIEW OF SYSTEMS:  Review of Systems  Constitutional: Negative for appetite change, chills, fatigue, fever and unexpected weight change.  HENT:   Negative for lump/mass, mouth sores and sore throat.   Respiratory: Negative for cough and shortness of breath.   Cardiovascular: Negative for chest pain and leg swelling.  Gastrointestinal: Negative for abdominal pain, constipation, diarrhea,  nausea and vomiting.  Endocrine: Negative for hot flashes.  Genitourinary: Negative for difficulty urinating, dysuria, frequency and hematuria.   Musculoskeletal: Negative for  arthralgias, back pain and myalgias.  Skin: Negative for rash.  Neurological: Negative for dizziness and headaches.  Hematological: Negative for adenopathy. Does not bruise/bleed easily.  Psychiatric/Behavioral: Negative for depression and sleep disturbance. The patient is not nervous/anxious.     VITALS:  Blood pressure (!) 141/70, pulse 77, temperature 98.9 F (37.2 C), resp. rate 16, height 5' (1.524 m), weight 171 lb 8 oz (77.8 kg), SpO2 100 %.  Wt Readings from Last 3 Encounters:  10/16/20 171 lb 8 oz (77.8 kg)  10/01/20 173 lb 8 oz (78.7 kg)  08/22/20 177 lb (80.3 kg)    Body mass index is 33.49 kg/m.  Performance status (ECOG): 1 - Symptomatic but completely ambulatory  PHYSICAL EXAM:  Physical Exam Vitals and nursing note reviewed.  Constitutional:      General: She is not in acute distress.    Appearance: Normal appearance.  HENT:     Head: Normocephalic and atraumatic.     Mouth/Throat:     Mouth: Mucous membranes are moist.     Pharynx: Oropharynx is clear. No oropharyngeal exudate or posterior oropharyngeal erythema.  Eyes:     General: No scleral icterus.    Extraocular Movements: Extraocular movements intact.     Conjunctiva/sclera: Conjunctivae normal.     Pupils: Pupils are equal, round, and reactive to light.  Cardiovascular:     Rate and Rhythm: Normal rate and regular rhythm.     Heart sounds: Normal heart sounds. No murmur heard. No friction rub. No gallop.   Pulmonary:     Effort: Pulmonary effort is normal.     Breath sounds: Normal breath sounds. No wheezing, rhonchi or rales.  Abdominal:     General: There is no distension.     Palpations: Abdomen is soft. There is no hepatomegaly, splenomegaly or mass.     Tenderness: There is no abdominal tenderness.  Genitourinary:    Comments:  Anal incision looks clean and is healing, with the open area down to about 5 mm. Musculoskeletal:        General: Normal range of motion.     Cervical back: Normal  range of motion and neck supple. No tenderness.     Right lower leg: No edema.     Left lower leg: No edema.  Lymphadenopathy:     Cervical: No cervical adenopathy.  Skin:    General: Skin is warm and dry.     Coloration: Skin is not jaundiced.     Findings: No rash.  Neurological:     Mental Status: She is alert and oriented to person, place, and time.     Cranial Nerves: No cranial nerve deficit.  Psychiatric:        Mood and Affect: Mood normal.        Behavior: Behavior normal.        Thought Content: Thought content normal.    LABS:   CBC Latest Ref Rng & Units 10/16/2020 10/01/2020 08/25/2020  WBC - 4.2 6.3 7.9  Hemoglobin 12.0 - 16.0 11.3(A) 11.9(A) 13.2  Hematocrit 36 - 46 33(A) 36 41.2  Platelets 150 - 399 279 396 280   CMP Latest Ref Rng & Units 10/16/2020 10/01/2020 08/25/2020  Glucose 70 - 99 mg/dL - - 137(H)  BUN 4 - _0 Creatinine 0.5 - 1.1 0.6 0.6 0.72  Sodium 137 - 147 138 137 137  Potassium 3.4 - 5.3 3.8 3.9 3.5  Chloride 99 - 108 99 97(A) 98  CO2 13 - 22 28(A) 32(A) 27  Calcium 8.7 - 10.7 9.3 9.5 9.3  Total Protein 6.3 - 8.2 g/dL - - -  Alkaline Phos 25 - 125 57 69 -  AST 13 - 35 20 21 -  ALT 7 - 35 11 11 -     Lab Results  Component Value Date   CEA1 1.1 10/01/2020   /  CEA  Date Value Ref Range Status  10/01/2020 1.1 0.0 - 4.7 ng/mL Final    Comment:    (NOTE)                             Nonsmokers          <3.9                             Smokers             <5.6 [employer] Electrochemiluminescence Immunoassay (ECLIA) Values obtained with different assay methods or kits cannot be used interchangeably.  Results cannot be interpreted as absolute evidence of the presence or absence of malignant disease. Performed At: [facility] [place], [place] [id number] Rush Farmer MD EQ:[id number]    No results found for: PSA1 No results found for: CAN199 No results found for: CAN125  No results found for:  TOTALPROTELP, ALBUMINELP, A1GS, A2GS, BETS, BETA2SER, GAMS, MSPIKE, SPEI No results found for: TIBC, FERRITIN, IRONPCTSAT No results found for: LDH  STUDIES:  No results found.    HISTORY:   Past Medical History:  Diagnosis Date  . Arthritis   . Asthma   . Cancer ([place]) 04/2020   colon  . Diabetes mellitus without complication ([place])   . Dyspnea    walking,  . Family history of adverse reaction to anesthesia    daughter PONV  . GERD (gastroesophageal reflux disease)   . Hypertension   . Hypothyroidism   . Thyroid disease     Past Surgical History:  Procedure Laterality Date  . ABDOMINAL HYSTERECTOMY     due to fibroids  . CHOLECYSTECTOMY    . HYSTERECTOMY ABDOMINAL WITH SALPINGO-OOPHORECTOMY    . POLYPECTOMY    . PORTA CATH INSERTION    . TUBAL LIGATION    . XI ROBOTIC ASSISTED LOWER ANTERIOR RESECTION N/A 08/22/2020   Procedure: XI ROBOTIC [place];  Surgeon: Leighton Ruff, MD;  Location: [facility];  Service: General;  Laterality: N/A;  4 HOURS    Family History  Problem Relation Age of Onset  . Diabetes Mother   . Hypertension Mother   . Heart disease Mother   . Heart attack Mother   . Diabetes Father   . Hypertension Father   . Diabetes Sister   . COPD Sister     Social History:  reports that she has never smoked. She has never used smokeless tobacco. She reports previous alcohol use. She reports that she does not use drugs.The patient is alone today.  Allergies:  Allergies  Allergen Reactions  . Penicillins Rash    Current Medications: Current Outpatient Medications  Medication Sig Dispense Refill  . albuterol (VENTOLIN HFA) 108 (90 Base) MCG/ACT inhaler Inhale 2 puffs into the lungs every 6 (six) hours as needed for shortness of breath or  wheezing.    Marland Kitchen atorvastatin (LIPITOR) 40 MG tablet Take 40 mg by mouth daily.    . cetirizine (ZYRTEC) 10 MG tablet Take 10 mg by mouth daily as needed for allergies.    .  diphenoxylate-atropine (LOMOTIL) 2.5-0.025 MG tablet Take 2 tablets by mouth 4 (four) times daily as needed for diarrhea or loose stools. 60 tablet 0  . furosemide (LASIX) 20 MG tablet Take 20 mg by mouth daily.    Marland Kitchen gabapentin (NEURONTIN) 300 MG capsule Take 300 mg by mouth 2 (two) times daily.    Marland Kitchen HYDROcodone-acetaminophen (NORCO) 7.5-325 MG tablet Take 1 tablet by mouth every 6 (six) hours as needed for moderate pain. 60 tablet 0  . levothyroxine (SYNTHROID) 100 MCG tablet Take 100 mcg by mouth daily before breakfast.    . metFORMIN (GLUCOPHAGE-XR) 500 MG 24 hr tablet Take 500 mg by mouth 2 (two) times daily with a meal.    . montelukast (SINGULAIR) 10 MG tablet Take 10 mg by mouth daily.    . ondansetron (ZOFRAN) 4 MG tablet Take 1 tablet (4 mg total) by mouth every 4 (four) hours as needed for nausea. 90 tablet 3  . pantoprazole (PROTONIX) 40 MG tablet Take 40 mg by mouth 2 (two) times daily.    . potassium chloride SA (KLOR-CON) 20 MEQ tablet Take 1 tablet (20 mEq total) by mouth 2 (two) times daily. 60 tablet 3  . prochlorperazine (COMPAZINE) 10 MG tablet Take 1 tablet (10 mg total) by mouth every 6 (six) hours as needed for nausea or vomiting. 90 tablet 3  . Accu-Chek Softclix Lancets lancets USE TO CHECK BLOOD GLUCOSE TWICE DAILY.    Marland Kitchen albuterol (PROVENTIL) (2.5 MG/3ML) 0.083% nebulizer solution Take 2.5 mg by nebulization every 6 (six) hours as needed for wheezing or shortness of breath. (Patient not taking: Reported on 10/16/2020)    . aspirin 81 MG EC tablet Take 81 mg by mouth daily. (Patient not taking: No sig reported)    . dicyclomine (BENTYL) 10 MG capsule Take 1 capsule (10 mg total) by mouth 4 (four) times daily -  before meals and at bedtime. (Patient not taking: Reported on 10/16/2020) 90 capsule 0  . glucose blood (ACCU-CHEK AVIVA PLUS) test strip Check blood glucose twice daily and as needed. Dx E 11.65    . Lancets Misc. (ACCU-CHEK FASTCLIX LANCET) KIT Check blood glucose twice  daily. Dx E 11.65     Current Facility-Administered Medications  Medication Dose Route Frequency Provider Last Rate Last Admin  . triamcinolone acetonide (KENALOG) 10 MG/ML injection 10 mg  10 mg Other Once Glidden, Titorya, DPM      . triamcinolone acetonide (KENALOG-40) injection 20 mg  20 mg Other Once Landis Martins, DPM         ASSESSMENT & PLAN:   Assessment: 1.  Clinical stage IIIB ([id number]) rectal cancer, based on CT reveal any several small perirectal and presacral lymph nodes, which were highly concerning for metastatic involvement. She underwent abdominoperitonealresection about 6 weeks ago and lymph nodes were negative.   She is now receiving adjuvant FOLFOX chemotherapy.  She had difficulty tolerating her 1st cycle due to nausea and vomiting.  2.  Benign polyps, removed at time of colonoscopy.  3.  History of peptic ulcer disease.  4.  Hypokalemia, resolved with oral supplementation.  She remains on furosemide, so she will continue potassium.  5.  Chemotherapy-induced nausea and vomiting despite multiple medications including IV Aloxi.  I will add IV  Emend with her chemotherapy.  I also instructed her to take Zofran 8 mg (i.e. 2-4 mg tablets) every 8 hours as needed and alternate this with Compazine if she has recurrent nausea and vomiting.    6. Worsening anemia, likely due to chemotherapy.  If it persists, we will plan to check vitamin levels to ensure there is no nutritional deficiency contributing to her anemia.  Plan:     I will plan to see her back in 1 week with a CBC and comprehensive metabolic panel prior to a 2nd cycle of FOLFOX. The patient understands the plans discussed today and is in agreement with them.  She knows to contact our office if she develops concerns prior to her next appointment.     Marvia Pickles, PA-C

## 2020-10-16 NOTE — Telephone Encounter (Signed)
Per 6/7 LOS, schedule patient for Follow Up after 6/13 Labs.  Patient scheduled for 6/13 Labs 11:00 am - Follow Up w/Kelli 11:30 am.  Gave patient Appt Summary

## 2020-10-17 ENCOUNTER — Encounter: Payer: Self-pay | Admitting: Oncology

## 2020-10-17 NOTE — Telephone Encounter (Signed)
Pt was in clinic 10/16/2020. I was able to talk with her about the side effects of first infusion (see documentation under progress note). I had attempted to reach pt 7-8 times without success. Pt encouraged to please call us if N/V remains uncontrolled @ any time. She verbalized understanding.

## 2020-10-18 ENCOUNTER — Encounter: Payer: Self-pay | Admitting: Oncology

## 2020-10-22 ENCOUNTER — Encounter: Payer: Self-pay | Admitting: Hematology and Oncology

## 2020-10-22 ENCOUNTER — Inpatient Hospital Stay: Payer: Medicaid Other

## 2020-10-22 ENCOUNTER — Telehealth: Payer: Self-pay | Admitting: Hematology and Oncology

## 2020-10-22 ENCOUNTER — Other Ambulatory Visit: Payer: Self-pay

## 2020-10-22 ENCOUNTER — Inpatient Hospital Stay (INDEPENDENT_AMBULATORY_CARE_PROVIDER_SITE_OTHER): Payer: Medicaid Other | Admitting: Hematology and Oncology

## 2020-10-22 VITALS — BP 142/67 | HR 68 | Temp 98.9°F | Resp 18 | Ht 60.0 in | Wt 173.9 lb

## 2020-10-22 DIAGNOSIS — C20 Malignant neoplasm of rectum: Secondary | ICD-10-CM | POA: Diagnosis not present

## 2020-10-22 LAB — BASIC METABOLIC PANEL
BUN: 10 (ref 4–21)
CO2: 30 — AB (ref 13–22)
Chloride: 101 (ref 99–108)
Creatinine: 0.6 (ref 0.5–1.1)
Glucose: 151
Potassium: 3.4 (ref 3.4–5.3)
Sodium: 138 (ref 137–147)

## 2020-10-22 LAB — COMPREHENSIVE METABOLIC PANEL
Albumin: 4.3 (ref 3.5–5.0)
Calcium: 9.2 (ref 8.7–10.7)

## 2020-10-22 LAB — CBC AND DIFFERENTIAL
HCT: 34 — AB (ref 36–46)
Hemoglobin: 11.5 — AB (ref 12.0–16.0)
Neutrophils Absolute: 2.63
Platelets: 230 (ref 150–399)
WBC: 3.7

## 2020-10-22 LAB — CBC: RBC: 4.14 (ref 3.87–5.11)

## 2020-10-22 LAB — HEPATIC FUNCTION PANEL
ALT: 11 (ref 7–35)
AST: 19 (ref 13–35)
Alkaline Phosphatase: 63 (ref 25–125)
Bilirubin, Total: 0.5

## 2020-10-22 NOTE — Progress Notes (Signed)
[facility]  [address] [place],  [place]  [address] [phone]  Clinic Day:  10/22/2020  Referring physician: Little Ishikawa, MD   CHIEF COMPLAINT:  CC:   Clinical stage IIIB rectal cancer  Current Treatment:   Plan for adjuvant FOLFOX chemotherapy every 2 weeks for 8 cycles   HISTORY OF PRESENT ILLNESS:  Tanya Mcclure is a 60 y.o. female with clinical stage IIIB ([id number]) rectal cancer diagnosed in December 2021.  She presented with daily rectal bleeding with bright red blood, chronic diarrhea and stool urgency.  She saw Dr. Melina Copa and underwent colonoscopy in December. This revealed a rectal mass beginning at about 2 cm from the anal verge and was noted to obstruct 50% of the circumference of the rectum.  Benign polyps were also present and removed.  She had never undergone colonoscopy prior to this procedure. Biopsy revealed invasive adenocarcinoma. EKG D revealed an ulcer, so ibuprofen was discontinued and she was placed on pantoprazole 40 mg twice daily. CT abdomen and pelvis revealed several small perirectal and presacral lymph nodes, highly concerning for metastatic involvement.  No other evidence of metastatic disease was observed.  She was seen by Dr. Lilia Pro in consultation to discuss surgical resection and recommended neoadjuvant chemoradiation.  She completed neoadjuvant chemoradiation with infusional 5 fluorouracil in February.  She developed severe hypocalcemia while on treatment requiring oral and IV replacement.  After neoadjuvant treatment she was referred to Dr. Marcello Moores in [place] for possible low anterior resection. MRI after neoadjuvant therapy revealed a 5.8 cm circumferential distal rectal tumor abutting the internal anal sphincter. Bulky residual tumor posteriorly with gross perirectal extension along the left posterior aspect, abutting/involving the left levator ani muscle.  Due to the tumor involving the pelvic floor muscles and  abutting the anal sphincter, abdominoperitoneal resection was recommended.  She underwent abdominoperitoneal resection on April 13th.  Pathology revealed a residual 3 cm invasive adenocarcinoma extending into the perirectal connective tissue.  Nineteen lymph nodes were negative for metastasis. Margins were negative and no lymphovascular or perineural invasion was seen.   We recommended adjuvant FOLFOX chemotherapy for 8 cycles.  She had her 1st cycle on May 31st.  INTERVAL HISTORY:  Tanya Mcclure is here today for repeat clinical assessment prior to a 2nd cycle of adjuvant FOLFOX chemotherapy.  She had difficulty tolerating her 1st cycle due to severe nausea and vomiting for 3 days despite receiving Aloxi IV with her chemotherapy.   She states the Zofran 4 mg and Compazine 10 mg were ineffective.   She was unable to eat or drink during that time. She did not contact our office. She denies diarrhea. The nausea and vomiting eventually resolved on its own. We are adding Emend with this 2nd cycle and she knows she can take 8 mg of Zofran.  She continues to have mild intermittent bleeding from the ostomy after changing the bag. She states her perianal incision is healing well.  She denies pain.  She denies fevers or chills.  Her appetite is improving. Her weight has increased 3 pounds over last 10 days .  She states she ran out of potassium for a few days, but got it filled this weekend.  REVIEW OF SYSTEMS:  Review of Systems  Constitutional:  Negative for appetite change, chills, fatigue, fever and unexpected weight change.  HENT:   Negative for lump/mass, mouth sores and sore throat.   Respiratory:  Negative for cough and shortness of breath.   Cardiovascular:  Negative for chest pain and leg swelling.  Gastrointestinal:  Negative for abdominal pain, constipation, diarrhea, nausea and vomiting.  Endocrine: Negative for hot flashes.  Genitourinary:  Negative for difficulty urinating, dysuria, frequency and  hematuria.   Musculoskeletal:  Negative for arthralgias, back pain and myalgias.  Skin:  Negative for rash.  Neurological:  Negative for dizziness and headaches.  Hematological:  Negative for adenopathy. Does not bruise/bleed easily.  Psychiatric/Behavioral:  Negative for depression and sleep disturbance. The patient is not nervous/anxious.    VITALS:  Blood pressure (!) 142/67, pulse 68, temperature 98.9 F (37.2 C), temperature source Oral, resp. rate 18, height 5' (1.524 m), weight 173 lb 14.4 oz (78.9 kg), SpO2 98 %.  Wt Readings from Last 3 Encounters:  10/22/20 173 lb 14.4 oz (78.9 kg)  10/16/20 171 lb 8 oz (77.8 kg)  10/01/20 173 lb 8 oz (78.7 kg)    Body mass index is 33.96 kg/m.  Performance status (ECOG): 0 - Asymptomatic  PHYSICAL EXAM:  Physical Exam Vitals and nursing note reviewed.  Constitutional:      General: She is not in acute distress.    Appearance: Normal appearance.  HENT:     Head: Normocephalic and atraumatic.     Mouth/Throat:     Mouth: Mucous membranes are moist.     Pharynx: Oropharynx is clear. No oropharyngeal exudate or posterior oropharyngeal erythema.  Eyes:     General: No scleral icterus.    Extraocular Movements: Extraocular movements intact.     Conjunctiva/sclera: Conjunctivae normal.     Pupils: Pupils are equal, round, and reactive to light.  Cardiovascular:     Rate and Rhythm: Normal rate and regular rhythm.     Heart sounds: Normal heart sounds. No murmur heard.   No friction rub. No gallop.  Pulmonary:     Effort: Pulmonary effort is normal.     Breath sounds: Normal breath sounds. No wheezing, rhonchi or rales.  Abdominal:     General: There is no distension.     Palpations: Abdomen is soft. There is no hepatomegaly, splenomegaly or mass.     Tenderness: There is no abdominal tenderness.  Genitourinary:    Comments:  Anal incision looks clean and is healing, with the open area down to about 5 mm. Musculoskeletal:         General: Normal range of motion.     Cervical back: Normal range of motion and neck supple. No tenderness.     Right lower leg: No edema.     Left lower leg: No edema.  Lymphadenopathy:     Cervical: No cervical adenopathy.  Skin:    General: Skin is warm and dry.     Coloration: Skin is not jaundiced.     Findings: No rash.  Neurological:     Mental Status: She is alert and oriented to person, place, and time.     Cranial Nerves: No cranial nerve deficit.  Psychiatric:        Mood and Affect: Mood normal.        Behavior: Behavior normal.        Thought Content: Thought content normal.   LABS:   CBC Latest Ref Rng & Units 10/22/2020 10/16/2020 10/01/2020  WBC - 3.7 4.2 6.3  Hemoglobin 12.0 - 16.0 11.5(A) 11.3(A) 11.9(A)  Hematocrit 36 - 46 34(A) 33(A) 36  Platelets 150 - 399 230 279 396   CMP Latest Ref Rng & Units 10/22/2020 10/16/2020 10/01/2020  Glucose 70 -  99 mg/dL - - -  BUN 4 - $R'21 10 18 18  'Xd$ Creatinine 0.5 - 1.1 0.6 0.6 0.6  Sodium 137 - 147 138 138 137  Potassium 3.4 - 5.3 3.4 3.8 3.9  Chloride 99 - 108 101 99 97(A)  CO2 13 - 22 30(A) 28(A) 32(A)  Calcium 8.7 - 10.7 9.2 9.3 9.5  Total Protein 6.3 - 8.2 g/dL - - -  Alkaline Phos 25 - 125 63 57 69  AST 13 - 35 $Re'19 20 21  'zJi$ ALT 7 - 35 $Re'11 11 11     'RlQ$ Lab Results  Component Value Date   CEA1 1.1 10/01/2020   /  CEA  Date Value Ref Range Status  10/01/2020 1.1 0.0 - 4.7 ng/mL Final    Comment:    (NOTE)                             Nonsmokers          <3.9                             Smokers             <5.6 [employer] Electrochemiluminescence Immunoassay (ECLIA) Values obtained with different assay methods or kits cannot be used interchangeably.  Results cannot be interpreted as absolute evidence of the presence or absence of malignant disease. Performed At: [facility] [place], [place] [id number] Rush Farmer MD ZL:[id number]    No results found for: PSA1 No results found for:  CAN199 No results found for: CAN125  No results found for: TOTALPROTELP, ALBUMINELP, A1GS, A2GS, BETS, BETA2SER, GAMS, MSPIKE, SPEI No results found for: TIBC, FERRITIN, IRONPCTSAT No results found for: LDH  STUDIES:  No results found.    HISTORY:   Past Medical History:  Diagnosis Date   Arthritis    Asthma    Cancer ([facility]) 04/2020   colon   Diabetes mellitus without complication (HCC)    Dyspnea    walking,   Family history of adverse reaction to anesthesia    daughter PONV   GERD (gastroesophageal reflux disease)    Hypertension    Hypothyroidism    Thyroid disease     Past Surgical History:  Procedure Laterality Date   ABDOMINAL HYSTERECTOMY     due to fibroids   CHOLECYSTECTOMY     HYSTERECTOMY ABDOMINAL WITH SALPINGO-OOPHORECTOMY     POLYPECTOMY     PORTA CATH INSERTION     TUBAL LIGATION     XI ROBOTIC ASSISTED LOWER ANTERIOR RESECTION N/A 08/22/2020   Procedure: XI ROBOTIC ABDOMINAL PERIITONEAL RESECTION;  Surgeon: Leighton Ruff, MD;  Location: [facility];  Service: General;  Laterality: N/A;  4 HOURS    Family History  Problem Relation Age of Onset   Diabetes Mother    Hypertension Mother    Heart disease Mother    Heart attack Mother    Diabetes Father    Hypertension Father    Diabetes Sister    COPD Sister     Social History:  reports that she has never smoked. She has never used smokeless tobacco. She reports previous alcohol use. She reports that she does not use drugs.The patient is alone today.  Allergies:  Allergies  Allergen Reactions   Penicillins Rash    Current Medications: Current Outpatient Medications  Medication Sig Dispense Refill   Accu-Chek Softclix Lancets  lancets USE TO CHECK BLOOD GLUCOSE TWICE DAILY.     albuterol (PROVENTIL) (2.5 MG/3ML) 0.083% nebulizer solution Take 2.5 mg by nebulization every 6 (six) hours as needed for wheezing or shortness of breath. (Patient not taking: Reported on 10/16/2020)     albuterol (VENTOLIN  HFA) 108 (90 Base) MCG/ACT inhaler Inhale 2 puffs into the lungs every 6 (six) hours as needed for shortness of breath or wheezing.     aspirin 81 MG EC tablet Take 81 mg by mouth daily. (Patient not taking: No sig reported)     atorvastatin (LIPITOR) 40 MG tablet Take 40 mg by mouth daily.     cetirizine (ZYRTEC) 10 MG tablet Take 10 mg by mouth daily as needed for allergies.     dicyclomine (BENTYL) 10 MG capsule Take 1 capsule (10 mg total) by mouth 4 (four) times daily -  before meals and at bedtime. (Patient not taking: Reported on 10/16/2020) 90 capsule 0   diphenoxylate-atropine (LOMOTIL) 2.5-0.025 MG tablet Take 2 tablets by mouth 4 (four) times daily as needed for diarrhea or loose stools. 60 tablet 0   furosemide (LASIX) 20 MG tablet Take 20 mg by mouth daily.     gabapentin (NEURONTIN) 300 MG capsule Take 300 mg by mouth 2 (two) times daily.     glucose blood (ACCU-CHEK AVIVA PLUS) test strip Check blood glucose twice daily and as needed. Dx E 11.65     HYDROcodone-acetaminophen (NORCO) 7.5-325 MG tablet Take 1 tablet by mouth every 6 (six) hours as needed for moderate pain. 60 tablet 0   Lancets Misc. (ACCU-CHEK FASTCLIX LANCET) KIT Check blood glucose twice daily. Dx E 11.65     levothyroxine (SYNTHROID) 100 MCG tablet Take 100 mcg by mouth daily before breakfast.     metFORMIN (GLUCOPHAGE-XR) 500 MG 24 hr tablet Take 500 mg by mouth 2 (two) times daily with a meal.     montelukast (SINGULAIR) 10 MG tablet Take 10 mg by mouth daily.     ondansetron (ZOFRAN) 4 MG tablet Take 1 tablet (4 mg total) by mouth every 4 (four) hours as needed for nausea. 90 tablet 3   pantoprazole (PROTONIX) 40 MG tablet Take 40 mg by mouth 2 (two) times daily.     potassium chloride SA (KLOR-CON) 20 MEQ tablet Take 1 tablet (20 mEq total) by mouth 2 (two) times daily. 60 tablet 3   prochlorperazine (COMPAZINE) 10 MG tablet Take 1 tablet (10 mg total) by mouth every 6 (six) hours as needed for nausea or vomiting.  90 tablet 3   Current Facility-Administered Medications  Medication Dose Route Frequency Provider Last Rate Last Admin   triamcinolone acetonide (KENALOG) 10 MG/ML injection 10 mg  10 mg Other Once Landis Martins, DPM       triamcinolone acetonide (KENALOG-40) injection 20 mg  20 mg Other Once Landis Martins, DPM         ASSESSMENT & PLAN:   Assessment: 1.  Clinical stage IIIB ([id number]) rectal cancer, based on CT reveal any several small perirectal and presacral lymph nodes, which were highly concerning for metastatic involvement. She underwent abdominoperitoneal resection about 6 weeks ago and lymph nodes were negative.   She is now receiving adjuvant FOLFOX chemotherapy.  She had difficulty tolerating her 1st cycle due to nausea and vomiting. We will add  IV Emend going forward. She knows she can take Zofran 8 mg every 8 hours as needed alternating with Compazine. She knows to contact our office if  she has nausea and vomiting which is not controlled.  2.  Benign polyps, removed at time of colonoscopy.   3.  History of peptic ulcer disease.   4.  Hypokalemia, resolved with oral supplementation.  She remains on furosemide, so she will continue potassium.   5.  Chemotherapy-induced nausea and vomiting despite multiple medications including IV Aloxi. We are adding IV Emend going forward.  I also instructed her to take Zofran 8 mg (i.e. 2-4 mg tablets) every 8 hours as needed and alternate this with Compazine if she has recurrent nausea and vomiting.    6. Anemia, likely due to chemotherapy, which is stable.   Plan:     I will plan to see her back in 2 weeks with a CBC and comprehensive metabolic panel prior to a 3rd cycle of FOLFOX. The patient understands the plans discussed today and is in agreement with them.  She knows to contact our office if she develops concerns prior to her next appointment.     Marvia Pickles, PA-C

## 2020-10-22 NOTE — Telephone Encounter (Signed)
Per 6/13 LOS, patient scheduled for 6/27 Labs, Follow - 6/28 Infusion - 6/30 Pump D/C.  Left patient message to get here Appt Summary at Next Visit, 6/14

## 2020-10-23 ENCOUNTER — Inpatient Hospital Stay: Payer: Medicaid Other

## 2020-10-23 VITALS — BP 138/73 | HR 83 | Temp 98.4°F | Resp 18 | Ht 60.0 in | Wt 176.0 lb

## 2020-10-23 DIAGNOSIS — Z5111 Encounter for antineoplastic chemotherapy: Secondary | ICD-10-CM | POA: Diagnosis not present

## 2020-10-23 DIAGNOSIS — C20 Malignant neoplasm of rectum: Secondary | ICD-10-CM

## 2020-10-23 MED ORDER — LEUCOVORIN CALCIUM INJECTION 350 MG
400.0000 mg/m2 | Freq: Once | INTRAVENOUS | Status: AC
  Administered 2020-10-23: 732 mg via INTRAVENOUS
  Filled 2020-10-23: qty 36.6

## 2020-10-23 MED ORDER — OXALIPLATIN CHEMO INJECTION 100 MG/20ML
83.0000 mg/m2 | Freq: Once | INTRAVENOUS | Status: AC
  Administered 2020-10-23: 150 mg via INTRAVENOUS
  Filled 2020-10-23: qty 20

## 2020-10-23 MED ORDER — FOSAPREPITANT DIMEGLUMINE INJECTION 150 MG
150.0000 mg | Freq: Once | INTRAVENOUS | Status: AC
Start: 2020-10-23 — End: 2020-10-23
  Administered 2020-10-23: 150 mg via INTRAVENOUS
  Filled 2020-10-23: qty 150

## 2020-10-23 MED ORDER — SODIUM CHLORIDE 0.9 % IV SOLN
2400.0000 mg/m2 | INTRAVENOUS | Status: DC
  Administered 2020-10-23: 4400 mg via INTRAVENOUS
  Filled 2020-10-23: qty 88

## 2020-10-23 MED ORDER — SODIUM CHLORIDE 0.9 % IV SOLN
10.0000 mg | Freq: Once | INTRAVENOUS | Status: AC
  Administered 2020-10-23: 10 mg via INTRAVENOUS
  Filled 2020-10-23: qty 10

## 2020-10-23 MED ORDER — PALONOSETRON HCL INJECTION 0.25 MG/5ML
0.2500 mg | Freq: Once | INTRAVENOUS | Status: AC
  Administered 2020-10-23: 0.25 mg via INTRAVENOUS

## 2020-10-23 MED ORDER — DEXTROSE 5 % IV SOLN
Freq: Once | INTRAVENOUS | Status: AC
  Filled 2020-10-23: qty 250

## 2020-10-23 MED ORDER — FLUOROURACIL CHEMO INJECTION 2.5 GM/50ML
400.0000 mg/m2 | Freq: Once | INTRAVENOUS | Status: AC
  Administered 2020-10-23: 750 mg via INTRAVENOUS
  Filled 2020-10-23: qty 15

## 2020-10-23 MED ORDER — PALONOSETRON HCL INJECTION 0.25 MG/5ML
INTRAVENOUS | Status: AC
  Filled 2020-10-23: qty 5

## 2020-10-23 NOTE — Patient Instructions (Signed)
[facility] [place]  Discharge Instructions: Thank you for choosing [facility] to provide your oncology and hematology care.  If you have a lab appointment with the Cancer Center, please go directly to the Cancer Center and check in at the registration area.   Wear comfortable clothing and clothing appropriate for easy access to any Portacath or PICC line.   We strive to give you quality time with your provider. You may need to reschedule your appointment if you arrive late (15 or more minutes).  Arriving late affects you and other patients whose appointments are after yours.  Also, if you miss three or more appointments without notifying the office, you may be dismissed from the clinic at the provider's discretion.      For prescription refill requests, have your pharmacy contact our office and allow 72 hours for refills to be completed.    Today you received the following chemotherapy and/or immunotherapy agents Oxaliplatin,Fluorouracil,Leucovorin     To help prevent nausea and vomiting after your treatment, we encourage you to take your nausea medication as directed.  BELOW ARE SYMPTOMS THAT SHOULD BE REPORTED IMMEDIATELY: . *FEVER GREATER THAN 100.4 F (38 C) OR HIGHER . *CHILLS OR SWEATING . *NAUSEA AND VOMITING THAT IS NOT CONTROLLED WITH YOUR NAUSEA MEDICATION . *UNUSUAL SHORTNESS OF BREATH . *UNUSUAL BRUISING OR BLEEDING . *URINARY PROBLEMS (pain or burning when urinating, or frequent urination) . *BOWEL PROBLEMS (unusual diarrhea, constipation, pain near the anus) . TENDERNESS IN MOUTH AND THROAT WITH OR WITHOUT PRESENCE OF ULCERS (sore throat, sores in mouth, or a toothache) . UNUSUAL RASH, SWELLING OR PAIN  . UNUSUAL VAGINAL DISCHARGE OR ITCHING   Items with * indicate a potential emergency and should be followed up as soon as possible or go to the Emergency Department if any problems should occur.  Please show the CHEMOTHERAPY ALERT CARD or  IMMUNOTHERAPY ALERT CARD at check-in to the Emergency Department and triage nurse.  Should you have questions after your visit or need to cancel or reschedule your appointment, please contact [facility]  Dept: [phone]  and follow the prompts.  Office hours are 8:00 a.m. to 4:30 p.m. Monday - Friday. Please note that voicemails left after 4:00 p.m. may not be returned until the following business day.  We are closed weekends and major holidays. You have access to a nurse at all times for urgent questions. Please call the main number to the clinic Dept: [phone] and follow the prompts.  For any non-urgent questions, you may also contact your provider using MyChart. We now offer e-Visits for anyone 18 and older to request care online for non-urgent symptoms. For details visit [email or web address].   Also download the MyChart app! Go to the app store, search "MyChart", open the app, select [facility], and log in with your MyChart username and password.  Due to Covid, a mask is required upon entering the hospital/clinic. If you do not have a mask, one will be given to you upon arrival. For doctor visits, patients may have 1 support person aged 18 or older with them. For treatment visits, patients cannot have anyone with them due to current Covid guidelines and our immunocompromised population.    

## 2020-10-23 NOTE — Progress Notes (Signed)
1431: PT STABLE AT TIME OF DISCHARGE

## 2020-10-25 ENCOUNTER — Inpatient Hospital Stay: Payer: Medicaid Other

## 2020-10-25 ENCOUNTER — Other Ambulatory Visit: Payer: Self-pay

## 2020-10-25 VITALS — BP 135/65 | HR 59 | Temp 98.6°F | Resp 16 | Ht 60.0 in | Wt 176.1 lb

## 2020-10-25 DIAGNOSIS — Z5111 Encounter for antineoplastic chemotherapy: Secondary | ICD-10-CM | POA: Diagnosis not present

## 2020-10-25 DIAGNOSIS — C20 Malignant neoplasm of rectum: Secondary | ICD-10-CM

## 2020-10-25 MED ORDER — SODIUM CHLORIDE 0.9% FLUSH
10.0000 mL | INTRAVENOUS | Status: DC | PRN
  Administered 2020-10-25: 10 mL
  Filled 2020-10-25: qty 10

## 2020-10-25 MED ORDER — HEPARIN SOD (PORK) LOCK FLUSH 100 UNIT/ML IV SOLN
500.0000 [IU] | Freq: Once | INTRAVENOUS | Status: AC | PRN
  Administered 2020-10-25: 500 [IU]
  Filled 2020-10-25: qty 5

## 2020-10-25 NOTE — Progress Notes (Signed)
Pump d/c at 1323 today.

## 2020-10-25 NOTE — Patient Instructions (Signed)
[facility]  Discharge Instructions: Thank you for choosing [facility] to provide your oncology and hematology care.  If you have a lab appointment with the Cancer Center, please go directly to the Cancer Center and check in at the registration area.   Wear comfortable clothing and clothing appropriate for easy access to any Portacath or PICC line.   We strive to give you quality time with your provider. You may need to reschedule your appointment if you arrive late (15 or more minutes).  Arriving late affects you and other patients whose appointments are after yours.  Also, if you miss three or more appointments without notifying the office, you may be dismissed from the clinic at the provider's discretion.      For prescription refill requests, have your pharmacy contact our office and allow 72 hours for refills to be completed.    Today you received the following chemotherapy and/or immunotherapy agents 5flouruoracil      To help prevent nausea and vomiting after your treatment, we encourage you to take your nausea medication as directed.  BELOW ARE SYMPTOMS THAT SHOULD BE REPORTED IMMEDIATELY: *FEVER GREATER THAN 100.4 F (38 C) OR HIGHER *CHILLS OR SWEATING *NAUSEA AND VOMITING THAT IS NOT CONTROLLED WITH YOUR NAUSEA MEDICATION *UNUSUAL SHORTNESS OF BREATH *UNUSUAL BRUISING OR BLEEDING *URINARY PROBLEMS (pain or burning when urinating, or frequent urination) *BOWEL PROBLEMS (unusual diarrhea, constipation, pain near the anus) TENDERNESS IN MOUTH AND THROAT WITH OR WITHOUT PRESENCE OF ULCERS (sore throat, sores in mouth, or a toothache) UNUSUAL RASH, SWELLING OR PAIN  UNUSUAL VAGINAL DISCHARGE OR ITCHING   Items with * indicate a potential emergency and should be followed up as soon as possible or go to the Emergency Department if any problems should occur.  Please show the CHEMOTHERAPY ALERT CARD or IMMUNOTHERAPY ALERT CARD at check-in to the  Emergency Department and triage nurse.  Should you have questions after your visit or need to cancel or reschedule your appointment, please contact  [facility]  Dept: [phone]  and follow the prompts.  Office hours are 8:00 a.m. to 4:30 p.m. Monday - Friday. Please note that voicemails left after 4:00 p.m. may not be returned until the following business day.  We are closed weekends and major holidays. You have access to a nurse at all times for urgent questions. Please call the main number to the clinic Dept: [phone] and follow the prompts.  For any non-urgent questions, you may also contact your provider using MyChart. We now offer e-Visits for anyone 18 and older to request care online for non-urgent symptoms. For details visit [email or web address].   Also download the MyChart app! Go to the app store, search "MyChart", open the app, select , and log in with your MyChart username and password.  Due to Covid, a mask is required upon entering the hospital/clinic. If you do not have a mask, one will be given to you upon arrival. For doctor visits, patients may have 1 support person aged 18 or older with them. For treatment visits, patients cannot have anyone with them due to current Covid guidelines and our immunocompromised population.    

## 2020-10-29 ENCOUNTER — Encounter: Payer: Self-pay | Admitting: Oncology

## 2020-11-05 ENCOUNTER — Inpatient Hospital Stay: Payer: Medicaid Other

## 2020-11-05 ENCOUNTER — Encounter: Payer: Self-pay | Admitting: Hematology and Oncology

## 2020-11-05 ENCOUNTER — Other Ambulatory Visit: Payer: Self-pay

## 2020-11-05 ENCOUNTER — Inpatient Hospital Stay (INDEPENDENT_AMBULATORY_CARE_PROVIDER_SITE_OTHER): Payer: Medicaid Other | Admitting: Hematology and Oncology

## 2020-11-05 VITALS — BP 148/82 | HR 83 | Temp 99.0°F | Resp 18 | Ht 60.0 in | Wt 173.7 lb

## 2020-11-05 DIAGNOSIS — C20 Malignant neoplasm of rectum: Secondary | ICD-10-CM

## 2020-11-05 DIAGNOSIS — R3911 Hesitancy of micturition: Secondary | ICD-10-CM | POA: Diagnosis not present

## 2020-11-05 LAB — CBC AND DIFFERENTIAL
HCT: 34 — AB (ref 36–46)
Hemoglobin: 11.5 — AB (ref 12.0–16.0)
Neutrophils Absolute: 2.21
Platelets: 161 (ref 150–399)
WBC: 3.4

## 2020-11-05 LAB — HEPATIC FUNCTION PANEL
ALT: 17 (ref 7–35)
AST: 26 (ref 13–35)
Alkaline Phosphatase: 66 (ref 25–125)
Bilirubin, Total: 0.3

## 2020-11-05 LAB — BASIC METABOLIC PANEL
BUN: 13 (ref 4–21)
CO2: 29 — AB (ref 13–22)
Chloride: 100 (ref 99–108)
Creatinine: 0.5 (ref 0.5–1.1)
Glucose: 219
Potassium: 3.8 (ref 3.4–5.3)
Sodium: 137 (ref 137–147)

## 2020-11-05 LAB — COMPREHENSIVE METABOLIC PANEL
Albumin: 4 (ref 3.5–5.0)
Calcium: 9.1 (ref 8.7–10.7)

## 2020-11-05 LAB — CBC: RBC: 4.03 (ref 3.87–5.11)

## 2020-11-05 NOTE — Progress Notes (Signed)
[facility]  [address] [place],  [place]  [address] [phone]  Clinic Day:  11/05/2020  Referring physician: Garnetta Buddy I, NP   CHIEF COMPLAINT:  CC:   Clinical stage IIIB rectal cancer  Current Treatment:   Plan for adjuvant FOLFOX chemotherapy every 2 weeks for 8 cycles   HISTORY OF PRESENT ILLNESS:  Tanya Mcclure is a 60 y.o. female with clinical stage IIIB ([id number]) rectal cancer diagnosed in December 2021.  She presented with daily rectal bleeding with bright red blood, chronic diarrhea and stool urgency.  She saw Dr. Melina Copa and underwent colonoscopy in December. This revealed a rectal mass beginning at about 2 cm from the anal verge and was noted to obstruct 50% of the circumference of the rectum.  Benign polyps were also present and removed.  She had never undergone colonoscopy prior to this procedure. Biopsy revealed invasive adenocarcinoma. EKG D revealed an ulcer, so ibuprofen was discontinued and she was placed on pantoprazole 40 mg twice daily. CT abdomen and pelvis revealed several small perirectal and presacral lymph nodes, highly concerning for metastatic involvement.  No other evidence of metastatic disease was observed.  She was seen by Dr. Lilia Pro in consultation to discuss surgical resection and recommended neoadjuvant chemoradiation.  She completed neoadjuvant chemoradiation with infusional 5 fluorouracil in February.  She developed severe hypocalcemia while on treatment requiring oral and IV replacement.  After neoadjuvant treatment she was referred to Dr. Marcello Moores in [place] for possible low anterior resection. MRI after neoadjuvant therapy revealed a 5.8 cm circumferential distal rectal tumor abutting the internal anal sphincter. Bulky residual tumor posteriorly with gross perirectal extension along the left posterior aspect, abutting/involving the left levator ani muscle.  Due to the tumor involving the pelvic floor muscles and  abutting the anal sphincter, abdominoperitoneal resection was recommended.  She underwent abdominoperitoneal resection on April 13th.  Pathology revealed a residual 3 cm invasive adenocarcinoma extending into the perirectal connective tissue.  Nineteen lymph nodes were negative for metastasis. Margins were negative and no lymphovascular or perineural invasion was seen.   We recommended adjuvant FOLFOX chemotherapy for 8 cycles.  She had her 1st cycle on May 31st.  She had difficulty tolerating her 1st cycle due to severe nausea and vomiting for 3 days despite receiving Aloxi IV with her chemotherapy.   She states the Zofran 4 mg and Compazine 10 mg were ineffective. We added Emend with the 2nd cycle and told her she could use $RemoveB'8mg'RBrEajcp$  of Zofran every 8 hours as needed.  INTERVAL HISTORY:  Tanya Mcclure is here today for repeat clinical assessment prior to a 3rd cycle of adjuvant FOLFOX chemotherapy.  She states she tolerated her 2nd cycle better but still felt "yucky". She only had vomiting for one day, but still nausea and anorexia for several days, so did not eat or drink well during that time. She continues to have mild intermittent bleeding from the ostomy after changing the bag, but now reports some redness of the surrounding skin.  I asked her to call Dr. Marcello Moores' office for recommendations as to what to use. She states her incision has healed. She denies pain.  She denies fevers or chills.  Her appetite is improving. Her weight has been stable.    REVIEW OF SYSTEMS:  Review of Systems  Constitutional:  Negative for appetite change, chills, fatigue, fever and unexpected weight change.  HENT:   Negative for lump/mass, mouth sores and sore throat.   Respiratory:  Negative  for cough and shortness of breath.   Cardiovascular:  Negative for chest pain and leg swelling.  Gastrointestinal:  Negative for abdominal pain, constipation, diarrhea, nausea and vomiting.  Endocrine: Negative for hot flashes.  Genitourinary:   Negative for difficulty urinating, dysuria, frequency and hematuria.   Musculoskeletal:  Negative for arthralgias, back pain and myalgias.  Skin:  Negative for rash.  Neurological:  Negative for dizziness and headaches.  Hematological:  Negative for adenopathy. Does not bruise/bleed easily.  Psychiatric/Behavioral:  Negative for depression and sleep disturbance. The patient is not nervous/anxious.     VITALS:  Blood pressure (!) 148/82, pulse 83, temperature 99 F (37.2 C), temperature source Oral, resp. rate 18, height 5' (1.524 m), weight 173 lb 11.2 oz (78.8 kg), SpO2 98 %.  Wt Readings from Last 3 Encounters:  11/05/20 173 lb 11.2 oz (78.8 kg)  10/25/20 176 lb 1.9 oz (79.9 kg)  10/23/20 176 lb (79.8 kg)    Body mass index is 33.92 kg/m.  Performance status (ECOG): 0 - Asymptomatic  PHYSICAL EXAM:  Physical Exam Vitals and nursing note reviewed.  Constitutional:      General: She is not in acute distress.    Appearance: Normal appearance.  HENT:     Head: Normocephalic and atraumatic.     Mouth/Throat:     Mouth: Mucous membranes are moist.     Pharynx: Oropharynx is clear. No oropharyngeal exudate or posterior oropharyngeal erythema.  Eyes:     General: No scleral icterus.    Extraocular Movements: Extraocular movements intact.     Conjunctiva/sclera: Conjunctivae normal.     Pupils: Pupils are equal, round, and reactive to light.  Cardiovascular:     Rate and Rhythm: Normal rate and regular rhythm.     Heart sounds: Normal heart sounds. No murmur heard.   No friction rub. No gallop.  Pulmonary:     Effort: Pulmonary effort is normal.     Breath sounds: Normal breath sounds. No wheezing, rhonchi or rales.  Abdominal:     General: There is no distension.     Palpations: Abdomen is soft. There is no hepatomegaly, splenomegaly or mass.     Tenderness: There is no abdominal tenderness.  Musculoskeletal:        General: Normal range of motion.     Cervical back:  Normal range of motion and neck supple. No tenderness.     Right lower leg: No edema.     Left lower leg: No edema.  Lymphadenopathy:     Cervical: No cervical adenopathy.  Skin:    General: Skin is warm and dry.     Coloration: Skin is not jaundiced.     Findings: No rash.  Neurological:     Mental Status: She is alert and oriented to person, place, and time.     Cranial Nerves: No cranial nerve deficit.  Psychiatric:        Mood and Affect: Mood normal.        Behavior: Behavior normal.        Thought Content: Thought content normal.   LABS:   CBC Latest Ref Rng & Units 11/05/2020 10/22/2020 10/16/2020  WBC - 3.4 3.7 4.2  Hemoglobin 12.0 - 16.0 11.5(A) 11.5(A) 11.3(A)  Hematocrit 36 - 46 34(A) 34(A) 33(A)  Platelets 150 - 399 161 230 279   CMP Latest Ref Rng & Units 11/05/2020 10/22/2020 10/16/2020  Glucose 70 - 99 mg/dL - - -  BUN 4 - 21 13 10  18  Creatinine 0.5 - 1.1 0.5 0.6 0.6  Sodium 137 - 147 137 138 138  Potassium 3.4 - 5.3 3.8 3.4 3.8  Chloride 99 - 108 100 101 99  CO2 13 - 22 29(A) 30(A) 28(A)  Calcium 8.7 - 10.7 9.1 9.2 9.3  Total Protein 6.3 - 8.2 g/dL - - -  Alkaline Phos 25 - 125 66 63 57  AST 13 - 35 $Re'26 19 20  'MwO$ ALT 7 - 35 $Re'17 11 11     'cLa$ Lab Results  Component Value Date   CEA1 1.1 10/01/2020   /  CEA  Date Value Ref Range Status  10/01/2020 1.1 0.0 - 4.7 ng/mL Final    Comment:    (NOTE)                             Nonsmokers          <3.9                             Smokers             <5.6 [employer] Electrochemiluminescence Immunoassay (ECLIA) Values obtained with different assay methods or kits cannot be used interchangeably.  Results cannot be interpreted as absolute evidence of the presence or absence of malignant disease. Performed At: [facility] [place], [place] [id number] Rush Farmer MD EM:[id number]    No results found for: PSA1 No results found for: CAN199 No results found for: CAN125  No results  found for: TOTALPROTELP, ALBUMINELP, A1GS, A2GS, BETS, BETA2SER, GAMS, MSPIKE, SPEI No results found for: TIBC, FERRITIN, IRONPCTSAT No results found for: LDH  STUDIES:  No results found.    HISTORY:   Past Medical History:  Diagnosis Date   Arthritis    Asthma    Cancer ([facility]) 04/2020   colon   Diabetes mellitus without complication (HCC)    Dyspnea    walking,   Family history of adverse reaction to anesthesia    daughter PONV   GERD (gastroesophageal reflux disease)    Hypertension    Hypothyroidism    Thyroid disease     Past Surgical History:  Procedure Laterality Date   ABDOMINAL HYSTERECTOMY     due to fibroids   CHOLECYSTECTOMY     HYSTERECTOMY ABDOMINAL WITH SALPINGO-OOPHORECTOMY     POLYPECTOMY     PORTA CATH INSERTION     TUBAL LIGATION     XI ROBOTIC ASSISTED LOWER ANTERIOR RESECTION N/A 08/22/2020   Procedure: XI ROBOTIC ABDOMINAL PERIITONEAL RESECTION;  Surgeon: Leighton Ruff, MD;  Location: [facility];  Service: General;  Laterality: N/A;  4 HOURS    Family History  Problem Relation Age of Onset   Diabetes Mother    Hypertension Mother    Heart disease Mother    Heart attack Mother    Diabetes Father    Hypertension Father    Diabetes Sister    COPD Sister     Social History:  reports that she has never smoked. She has never used smokeless tobacco. She reports previous alcohol use. She reports that she does not use drugs.The patient is alone today.  Allergies:  Allergies  Allergen Reactions   Penicillins Rash    Current Medications: Current Outpatient Medications  Medication Sig Dispense Refill   Accu-Chek Softclix Lancets lancets USE TO CHECK BLOOD GLUCOSE TWICE DAILY.  albuterol (PROVENTIL) (2.5 MG/3ML) 0.083% nebulizer solution Take 2.5 mg by nebulization every 6 (six) hours as needed for wheezing or shortness of breath. (Patient not taking: Reported on 10/16/2020)     albuterol (VENTOLIN HFA) 108 (90 Base) MCG/ACT inhaler Inhale 2 puffs  into the lungs every 6 (six) hours as needed for shortness of breath or wheezing.     aspirin 81 MG EC tablet Take 81 mg by mouth daily. (Patient not taking: No sig reported)     atorvastatin (LIPITOR) 40 MG tablet Take 40 mg by mouth daily.     cetirizine (ZYRTEC) 10 MG tablet Take 10 mg by mouth daily as needed for allergies.     dicyclomine (BENTYL) 10 MG capsule Take 1 capsule (10 mg total) by mouth 4 (four) times daily -  before meals and at bedtime. (Patient not taking: Reported on 10/16/2020) 90 capsule 0   diphenoxylate-atropine (LOMOTIL) 2.5-0.025 MG tablet Take 2 tablets by mouth 4 (four) times daily as needed for diarrhea or loose stools. 60 tablet 0   furosemide (LASIX) 20 MG tablet Take 20 mg by mouth daily.     gabapentin (NEURONTIN) 300 MG capsule Take 300 mg by mouth 2 (two) times daily.     glucose blood (ACCU-CHEK AVIVA PLUS) test strip Check blood glucose twice daily and as needed. Dx E 11.65     HYDROcodone-acetaminophen (NORCO) 7.5-325 MG tablet Take 1 tablet by mouth every 6 (six) hours as needed for moderate pain. 60 tablet 0   Lancets Misc. (ACCU-CHEK FASTCLIX LANCET) KIT Check blood glucose twice daily. Dx E 11.65     levothyroxine (SYNTHROID) 100 MCG tablet Take 100 mcg by mouth daily before breakfast.     metFORMIN (GLUCOPHAGE-XR) 500 MG 24 hr tablet Take 500 mg by mouth 2 (two) times daily with a meal.     montelukast (SINGULAIR) 10 MG tablet Take 10 mg by mouth daily.     ondansetron (ZOFRAN) 4 MG tablet Take 1 tablet (4 mg total) by mouth every 4 (four) hours as needed for nausea. 90 tablet 3   pantoprazole (PROTONIX) 40 MG tablet Take 40 mg by mouth 2 (two) times daily.     potassium chloride SA (KLOR-CON) 20 MEQ tablet Take 1 tablet (20 mEq total) by mouth 2 (two) times daily. 60 tablet 3   prochlorperazine (COMPAZINE) 10 MG tablet Take 1 tablet (10 mg total) by mouth every 6 (six) hours as needed for nausea or vomiting. 90 tablet 3   Current Facility-Administered  Medications  Medication Dose Route Frequency Provider Last Rate Last Admin   triamcinolone acetonide (KENALOG) 10 MG/ML injection 10 mg  10 mg Other Once Landis Martins, DPM       triamcinolone acetonide (KENALOG-40) injection 20 mg  20 mg Other Once Landis Martins, DPM         ASSESSMENT & PLAN:   Assessment:  1.  Clinical stage IIIB ([id number]) rectal cancer, based on CT reveal any several small perirectal and presacral lymph nodes, which were highly concerning for metastatic involvement. She underwent abdominoperitoneal resection about 6 weeks ago and lymph nodes were negative.   She is now receiving adjuvant FOLFOX chemotherapy with plans for 8 cycles.    She tolerated her 2nd cycle better with the addition of Emend and increasing Zofran to 8 mg every 8 hours as needed.  She only had one day of vomiting, but still had several days of nausea and anorexia.  2.  Benign polyps, removed at time  of colonoscopy.   3.  History of peptic ulcer disease.   4.  Hypokalemia, resolved with oral supplementation.  She remains on furosemide, so she will continue potassium.   5.  Chemotherapy-induced nausea and vomiting despite multiple medications including IV Aloxi.  This improved somewhat with IV Emend and Zofran 8 mg every 8 hours as needed alternating with Compazine.    6. Anemia, likely due to chemotherapy, which is stable.   Plan:      We will proceed with a 3rd cycle this week. I will plan to see her back in 2 weeks with a CBC and comprehensive metabolic panel prior to a 4th cycle of FOLFOX. The patient understands the plans discussed today and is in agreement with them.  She knows to contact our office if she develops concerns prior to her next appointment.     Marvia Pickles, PA-C

## 2020-11-06 ENCOUNTER — Other Ambulatory Visit: Payer: Self-pay | Admitting: Pharmacist

## 2020-11-06 ENCOUNTER — Encounter: Payer: Self-pay | Admitting: Oncology

## 2020-11-06 ENCOUNTER — Inpatient Hospital Stay: Payer: Medicaid Other

## 2020-11-06 DIAGNOSIS — Z5111 Encounter for antineoplastic chemotherapy: Secondary | ICD-10-CM | POA: Diagnosis not present

## 2020-11-06 DIAGNOSIS — C20 Malignant neoplasm of rectum: Secondary | ICD-10-CM

## 2020-11-06 MED ORDER — LEUCOVORIN CALCIUM INJECTION 350 MG
400.0000 mg/m2 | Freq: Once | INTRAVENOUS | Status: AC
  Administered 2020-11-06: 732 mg via INTRAVENOUS
  Filled 2020-11-06: qty 35

## 2020-11-06 MED ORDER — FLUOROURACIL CHEMO INJECTION 2.5 GM/50ML
400.0000 mg/m2 | Freq: Once | INTRAVENOUS | Status: AC
  Administered 2020-11-06: 750 mg via INTRAVENOUS
  Filled 2020-11-06: qty 15

## 2020-11-06 MED ORDER — DEXTROSE 5 % IV SOLN
Freq: Once | INTRAVENOUS | Status: AC
  Filled 2020-11-06: qty 250

## 2020-11-06 MED ORDER — PALONOSETRON HCL INJECTION 0.25 MG/5ML
INTRAVENOUS | Status: AC
  Filled 2020-11-06: qty 5

## 2020-11-06 MED ORDER — SODIUM CHLORIDE 0.9 % IV SOLN
2400.0000 mg/m2 | INTRAVENOUS | Status: DC
  Administered 2020-11-06: 4400 mg via INTRAVENOUS
  Filled 2020-11-06: qty 88

## 2020-11-06 MED ORDER — SODIUM CHLORIDE 0.9 % IV SOLN
150.0000 mg | Freq: Once | INTRAVENOUS | Status: AC
  Administered 2020-11-06: 150 mg via INTRAVENOUS
  Filled 2020-11-06: qty 150

## 2020-11-06 MED ORDER — PALONOSETRON HCL INJECTION 0.25 MG/5ML
0.2500 mg | Freq: Once | INTRAVENOUS | Status: AC
Start: 2020-11-06 — End: 2020-11-06
  Administered 2020-11-06: 0.25 mg via INTRAVENOUS

## 2020-11-06 MED ORDER — OXALIPLATIN CHEMO INJECTION 100 MG/20ML
83.0000 mg/m2 | Freq: Once | INTRAVENOUS | Status: AC
  Administered 2020-11-06: 150 mg via INTRAVENOUS
  Filled 2020-11-06: qty 20

## 2020-11-06 MED ORDER — SODIUM CHLORIDE 0.9 % IV SOLN
10.0000 mg | Freq: Once | INTRAVENOUS | Status: AC
  Administered 2020-11-06: 10 mg via INTRAVENOUS
  Filled 2020-11-06: qty 10

## 2020-11-06 NOTE — Patient Instructions (Signed)
Fluorouracil, 5-FU injection What is this medication? FLUOROURACIL, 5-FU (flure oh YOOR a sil) is a chemotherapy drug. It slows the growth of cancer cells. This medicine is used to treat many types of cancer like breast cancer, colon or rectal cancer, pancreatic cancer, and stomachcancer. This medicine may be used for other purposes; ask your health care provider orpharmacist if you have questions. COMMON BRAND NAME(S): Adrucil What should I tell my care team before I take this medication? They need to know if you have any of these conditions: blood disorders dihydropyrimidine dehydrogenase (DPD) deficiency infection (especially a virus infection such as chickenpox, cold sores, or herpes) kidney disease liver disease malnourished, poor nutrition recent or ongoing radiation therapy an unusual or allergic reaction to fluorouracil, other chemotherapy, other medicines, foods, dyes, or preservatives pregnant or trying to get pregnant breast-feeding How should I use this medication? This drug is given as an infusion or injection into a vein. It is administeredin a hospital or clinic by a specially trained health care professional. Talk to your pediatrician regarding the use of this medicine in children.Special care may be needed. Overdosage: If you think you have taken too much of this medicine contact apoison control center or emergency room at once. NOTE: This medicine is only for you. Do not share this medicine with others. What if I miss a dose? It is important not to miss your dose. Call your doctor or health careprofessional if you are unable to keep an appointment. What may interact with this medication? Do not take this medicine with any of the following medications: live virus vaccines This medicine may also interact with the following medications: medicines that treat or prevent blood clots like warfarin, enoxaparin, and dalteparin This list may not describe all possible  interactions. Give your health care provider a list of all the medicines, herbs, non-prescription drugs, or dietary supplements you use. Also tell them if you smoke, drink alcohol, or use illegaldrugs. Some items may interact with your medicine. What should I watch for while using this medication? Visit your doctor for checks on your progress. This drug may make you feel generally unwell. This is not uncommon, as chemotherapy can affect healthy cells as well as cancer cells. Report any side effects. Continue your course oftreatment even though you feel ill unless your doctor tells you to stop. In some cases, you may be given additional medicines to help with side effects.Follow all directions for their use. Call your doctor or health care professional for advice if you get a fever, chills or sore throat, or other symptoms of a cold or flu. Do not treat yourself. This drug decreases your body's ability to fight infections. Try toavoid being around people who are sick. This medicine may increase your risk to bruise or bleed. Call your doctor orhealth care professional if you notice any unusual bleeding. Be careful brushing and flossing your teeth or using a toothpick because you may get an infection or bleed more easily. If you have any dental work done,tell your dentist you are receiving this medicine. Avoid taking products that contain aspirin, acetaminophen, ibuprofen, naproxen, or ketoprofen unless instructed by your doctor. These medicines may hide afever. Do not become pregnant while taking this medicine. Women should inform their doctor if they wish to become pregnant or think they might be pregnant. There is a potential for serious side effects to an unborn child. Talk to your health care professional or pharmacist for more information. Do not breast-feed aninfant while taking this   medicine. Men should inform their doctor if they wish to father a child. This medicinemay lower sperm counts. Do not  treat diarrhea with over the counter products. Contact your doctor ifyou have diarrhea that lasts more than 2 days or if it is severe and watery. This medicine can make you more sensitive to the sun. Keep out of the sun. If you cannot avoid being in the sun, wear protective clothing and use sunscreen.Do not use sun lamps or tanning beds/booths. What side effects may I notice from receiving this medication? Side effects that you should report to your doctor or health care professionalas soon as possible: allergic reactions like skin rash, itching or hives, swelling of the face, lips, or tongue low blood counts - this medicine may decrease the number of white blood cells, red blood cells and platelets. You may be at increased risk for infections and bleeding. signs of infection - fever or chills, cough, sore throat, pain or difficulty passing urine signs of decreased platelets or bleeding - bruising, pinpoint red spots on the skin, black, tarry stools, blood in the urine signs of decreased red blood cells - unusually weak or tired, fainting spells, lightheadedness breathing problems changes in vision chest pain mouth sores nausea and vomiting pain, swelling, redness at site where injected pain, tingling, numbness in the hands or feet redness, swelling, or sores on hands or feet stomach pain unusual bleeding Side effects that usually do not require medical attention (report to yourdoctor or health care professional if they continue or are bothersome): changes in finger or toe nails diarrhea dry or itchy skin hair loss headache loss of appetite sensitivity of eyes to the light stomach upset unusually teary eyes This list may not describe all possible side effects. Call your doctor for medical advice about side effects. You may report side effects to FDA at1-800-FDA-1088. Where should I keep my medication? This drug is given in a hospital or clinic and will not be stored at home. NOTE:  This sheet is a summary. It may not cover all possible information. If you have questions about this medicine, talk to your doctor, pharmacist, orhealth care provider.  2022 Elsevier/Gold Standard (2019-03-29 15:00:03) Leucovorin injection What is this medication? LEUCOVORIN (loo koe VOR in) is used to prevent or treat the harmful effects of some medicines. This medicine is used to treat anemia caused by a low amount of folic acid in the body. It is also used with 5-fluorouracil (5-FU) to treatcolon cancer. This medicine may be used for other purposes; ask your health care provider orpharmacist if you have questions. What should I tell my care team before I take this medication? They need to know if you have any of these conditions: anemia from low levels of vitamin B-12 in the blood an unusual or allergic reaction to leucovorin, folic acid, other medicines, foods, dyes, or preservatives pregnant or trying to get pregnant breast-feeding How should I use this medication? This medicine is for injection into a muscle or into a vein. It is given by ahealth care professional in a hospital or clinic setting. Talk to your pediatrician regarding the use of this medicine in children.Special care may be needed. Overdosage: If you think you have taken too much of this medicine contact apoison control center or emergency room at once. NOTE: This medicine is only for you. Do not share this medicine with others. What if I miss a dose? This does not apply. What may interact with this medication? capecitabine fluorouracil phenobarbital   phenytoin primidone trimethoprim-sulfamethoxazole This list may not describe all possible interactions. Give your health care provider a list of all the medicines, herbs, non-prescription drugs, or dietary supplements you use. Also tell them if you smoke, drink alcohol, or use illegaldrugs. Some items may interact with your medicine. What should I watch for while using  this medication? Your condition will be monitored carefully while you are receiving thismedicine. This medicine may increase the side effects of 5-fluorouracil, 5-FU. Tell your doctor or health care professional if you have diarrhea or mouth sores that donot get better or that get worse. What side effects may I notice from receiving this medication? Side effects that you should report to your doctor or health care professionalas soon as possible: allergic reactions like skin rash, itching or hives, swelling of the face, lips, or tongue breathing problems fever, infection mouth sores unusual bleeding or bruising unusually weak or tired Side effects that usually do not require medical attention (report to yourdoctor or health care professional if they continue or are bothersome): constipation or diarrhea loss of appetite nausea, vomiting This list may not describe all possible side effects. Call your doctor for medical advice about side effects. You may report side effects to FDA at1-800-FDA-1088. Where should I keep my medication? This drug is given in a hospital or clinic and will not be stored at home. NOTE: This sheet is a summary. It may not cover all possible information. If you have questions about this medicine, talk to your doctor, pharmacist, orhealth care provider.  2022 Elsevier/Gold Standard (2007-11-02 16:50:29) Oxaliplatin Injection What is this medication? OXALIPLATIN (ox AL i PLA tin) is a chemotherapy drug. It targets fast dividing cells, like cancer cells, and causes these cells to die. This medicine is usedto treat cancers of the colon and rectum, and many other cancers. This medicine may be used for other purposes; ask your health care provider orpharmacist if you have questions. COMMON BRAND NAME(S): Eloxatin What should I tell my care team before I take this medication? They need to know if you have any of these conditions: heart disease history of irregular  heartbeat liver disease low blood counts, like white cells, platelets, or red blood cells lung or breathing disease, like asthma take medicines that treat or prevent blood clots tingling of the fingers or toes, or other nerve disorder an unusual or allergic reaction to oxaliplatin, other chemotherapy, other medicines, foods, dyes, or preservatives pregnant or trying to get pregnant breast-feeding How should I use this medication? This drug is given as an infusion into a vein. It is administered in a hospitalor clinic by a specially trained health care professional. Talk to your pediatrician regarding the use of this medicine in children.Special care may be needed. Overdosage: If you think you have taken too much of this medicine contact apoison control center or emergency room at once. NOTE: This medicine is only for you. Do not share this medicine with others. What if I miss a dose? It is important not to miss a dose. Call your doctor or health careprofessional if you are unable to keep an appointment. What may interact with this medication? Do not take this medicine with any of the following medications: cisapride dronedarone pimozide thioridazine This medicine may also interact with the following medications: aspirin and aspirin-like medicines certain medicines that treat or prevent blood clots like warfarin, apixaban, dabigatran, and rivaroxaban cisplatin cyclosporine diuretics medicines for infection like acyclovir, adefovir, amphotericin B, bacitracin, cidofovir, foscarnet, ganciclovir, gentamicin, pentamidine, vancomycin NSAIDs,   medicines for pain and inflammation, like ibuprofen or naproxen other medicines that prolong the QT interval (an abnormal heart rhythm) pamidronate zoledronic acid This list may not describe all possible interactions. Give your health care provider a list of all the medicines, herbs, non-prescription drugs, or dietary supplements you use. Also tell  them if you smoke, drink alcohol, or use illegaldrugs. Some items may interact with your medicine. What should I watch for while using this medication? Your condition will be monitored carefully while you are receiving thismedicine. You may need blood work done while you are taking this medicine. This medicine may make you feel generally unwell. This is not uncommon as chemotherapy can affect healthy cells as well as cancer cells. Report any side effects. Continue your course of treatment even though you feel ill unless yourhealthcare professional tells you to stop. This medicine can make you more sensitive to cold. Do not drink cold drinks or use ice. Cover exposed skin before coming in contact with cold temperatures or cold objects. When out in cold weather wear warm clothing and cover your mouth and nose to warm the air that goes into your lungs. Tell your doctor if you getsensitive to the cold. Do not become pregnant while taking this medicine or for 9 months after stopping it. Women should inform their health care professional if they wish to become pregnant or think they might be pregnant. Men should not father a child while taking this medicine and for 6 months after stopping it. There is potential for serious side effects to an unborn child. Talk to your health careprofessional for more information. Do not breast-feed a child while taking this medicine or for 3 months afterstopping it. This medicine has caused ovarian failure in some women. This medicine may make it more difficult to get pregnant. Talk to your health care professional if youare concerned about your fertility. This medicine has caused decreased sperm counts in some men. This may make it more difficult to father a child. Talk to your health care professional if youare concerned about your fertility. This medicine may increase your risk of getting an infection. Call your health care professional for advice if you get a fever, chills,  or sore throat, or other symptoms of a cold or flu. Do not treat yourself. Try to avoid beingaround people who are sick. Avoid taking medicines that contain aspirin, acetaminophen, ibuprofen, naproxen, or ketoprofen unless instructed by your health care professional.These medicines may hide a fever. Be careful brushing or flossing your teeth or using a toothpick because you may get an infection or bleed more easily. If you have any dental work done, tellyour dentist you are receiving this medicine. What side effects may I notice from receiving this medication? Side effects that you should report to your doctor or health care professionalas soon as possible: allergic reactions like skin rash, itching or hives, swelling of the face, lips, or tongue breathing problems cough low blood counts - this medicine may decrease the number of white blood cells, red blood cells, and platelets. You may be at increased risk for infections and bleeding nausea, vomiting pain, redness, or irritation at site where injected pain, tingling, numbness in the hands or feet signs and symptoms of bleeding such as bloody or black, tarry stools; red or dark brown urine; spitting up blood or brown material that looks like coffee grounds; red spots on the skin; unusual bruising or bleeding from the eyes, gums, or nose signs and symptoms of a dangerous   change in heartbeat or heart rhythm like chest pain; dizziness; fast, irregular heartbeat; palpitations; feeling faint or lightheaded; falls signs and symptoms of infection like fever; chills; cough; sore throat; pain or trouble passing urine signs and symptoms of liver injury like dark yellow or brown urine; general ill feeling or flu-like symptoms; light-colored stools; loss of appetite; nausea; right upper belly pain; unusually weak or tired; yellowing of the eyes or skin signs and symptoms of low red blood cells or anemia such as unusually weak or tired; feeling faint or  lightheaded; falls signs and symptoms of muscle injury like dark urine; trouble passing urine or change in the amount of urine; unusually weak or tired; muscle pain; back pain Side effects that usually do not require medical attention (report to yourdoctor or health care professional if they continue or are bothersome): changes in taste diarrhea gas hair loss loss of appetite mouth sores This list may not describe all possible side effects. Call your doctor for medical advice about side effects. You may report side effects to FDA at1-800-FDA-1088. Where should I keep my medication? This drug is given in a hospital or clinic and will not be stored at home. NOTE: This sheet is a summary. It may not cover all possible information. If you have questions about this medicine, talk to your doctor, pharmacist, orhealth care provider.  2022 Elsevier/Gold Standard (2018-09-15 12:20:35)  

## 2020-11-07 ENCOUNTER — Encounter: Payer: Self-pay | Admitting: Hematology and Oncology

## 2020-11-08 ENCOUNTER — Encounter: Payer: Self-pay | Admitting: Hematology and Oncology

## 2020-11-08 ENCOUNTER — Ambulatory Visit: Payer: Medicaid Other

## 2020-11-08 ENCOUNTER — Other Ambulatory Visit: Payer: Self-pay

## 2020-11-08 ENCOUNTER — Inpatient Hospital Stay: Payer: Medicaid Other

## 2020-11-08 VITALS — BP 148/69 | HR 70 | Temp 98.6°F | Resp 18 | Ht 60.0 in | Wt 173.2 lb

## 2020-11-08 DIAGNOSIS — Z5111 Encounter for antineoplastic chemotherapy: Secondary | ICD-10-CM | POA: Diagnosis not present

## 2020-11-08 DIAGNOSIS — C20 Malignant neoplasm of rectum: Secondary | ICD-10-CM

## 2020-11-08 MED ORDER — HEPARIN SOD (PORK) LOCK FLUSH 100 UNIT/ML IV SOLN
500.0000 [IU] | Freq: Once | INTRAVENOUS | Status: AC | PRN
  Administered 2020-11-08: 500 [IU]
  Filled 2020-11-08: qty 5

## 2020-11-08 MED ORDER — SODIUM CHLORIDE 0.9% FLUSH
10.0000 mL | INTRAVENOUS | Status: DC | PRN
  Administered 2020-11-08: 10 mL
  Filled 2020-11-08: qty 10

## 2020-11-08 NOTE — Patient Instructions (Signed)
[facility] [place]  Discharge Instructions: Thank you for choosing [facility] to provide your oncology and hematology care.  If you have a lab appointment with the Cancer Center, please go directly to the Cancer Center and check in at the registration area.   Wear comfortable clothing and clothing appropriate for easy access to any Portacath or PICC line.   We strive to give you quality time with your provider. You may need to reschedule your appointment if you arrive late (15 or more minutes).  Arriving late affects you and other patients whose appointments are after yours.  Also, if you miss three or more appointments without notifying the office, you may be dismissed from the clinic at the provider's discretion.      For prescription refill requests, have your pharmacy contact our office and allow 72 hours for refills to be completed.    Today you received the following chemotherapy and/or immunotherapy agents Fluorouracil    To help prevent nausea and vomiting after your treatment, we encourage you to take your nausea medication as directed.  BELOW ARE SYMPTOMS THAT SHOULD BE REPORTED IMMEDIATELY: *FEVER GREATER THAN 100.4 F (38 C) OR HIGHER *CHILLS OR SWEATING *NAUSEA AND VOMITING THAT IS NOT CONTROLLED WITH YOUR NAUSEA MEDICATION *UNUSUAL SHORTNESS OF BREATH *UNUSUAL BRUISING OR BLEEDING *URINARY PROBLEMS (pain or burning when urinating, or frequent urination) *BOWEL PROBLEMS (unusual diarrhea, constipation, pain near the anus) TENDERNESS IN MOUTH AND THROAT WITH OR WITHOUT PRESENCE OF ULCERS (sore throat, sores in mouth, or a toothache) UNUSUAL RASH, SWELLING OR PAIN  UNUSUAL VAGINAL DISCHARGE OR ITCHING   Items with * indicate a potential emergency and should be followed up as soon as possible or go to the Emergency Department if any problems should occur.  Please show the CHEMOTHERAPY ALERT CARD or IMMUNOTHERAPY ALERT CARD at check-in to the  Emergency Department and triage nurse.  Should you have questions after your visit or need to cancel or reschedule your appointment, please contact [facility] [place] [facility]  Dept: [phone]  and follow the prompts.  Office hours are 8:00 a.m. to 4:30 p.m. Monday - Friday. Please note that voicemails left after 4:00 p.m. may not be returned until the following business day.  We are closed weekends and major holidays. You have access to a nurse at all times for urgent questions. Please call the main number to the clinic Dept: [phone] and follow the prompts.  For any non-urgent questions, you may also contact your provider using MyChart. We now offer e-Visits for anyone 18 and older to request care online for non-urgent symptoms. For details visit [email or web address].   Also download the MyChart app! Go to the app store, search "MyChart", open the app, select [facility], and log in with your MyChart username and password.  Due to Covid, a mask is required upon entering the hospital/clinic. If you do not have a mask, one will be given to you upon arrival. For doctor visits, patients may have 1 support person aged 18 or older with them. For treatment visits, patients cannot have anyone with them due to current Covid guidelines and our immunocompromised population.    

## 2020-11-15 ENCOUNTER — Inpatient Hospital Stay: Payer: Medicaid Other | Attending: Oncology

## 2020-11-15 ENCOUNTER — Other Ambulatory Visit: Payer: Self-pay | Admitting: Hematology and Oncology

## 2020-11-15 ENCOUNTER — Other Ambulatory Visit: Payer: Self-pay

## 2020-11-15 DIAGNOSIS — D649 Anemia, unspecified: Secondary | ICD-10-CM | POA: Insufficient documentation

## 2020-11-15 DIAGNOSIS — Z5189 Encounter for other specified aftercare: Secondary | ICD-10-CM | POA: Insufficient documentation

## 2020-11-15 DIAGNOSIS — C20 Malignant neoplasm of rectum: Secondary | ICD-10-CM

## 2020-11-15 DIAGNOSIS — E876 Hypokalemia: Secondary | ICD-10-CM | POA: Insufficient documentation

## 2020-11-15 DIAGNOSIS — Z5111 Encounter for antineoplastic chemotherapy: Secondary | ICD-10-CM | POA: Insufficient documentation

## 2020-11-15 LAB — BASIC METABOLIC PANEL
BUN: 12 (ref 4–21)
CO2: 29 — AB (ref 13–22)
Chloride: 99 (ref 99–108)
Creatinine: 0.6 (ref 0.5–1.1)
Glucose: 147
Potassium: 3.6 (ref 3.4–5.3)
Sodium: 137 (ref 137–147)

## 2020-11-15 LAB — CBC AND DIFFERENTIAL
HCT: 36 (ref 36–46)
Hemoglobin: 12 (ref 12.0–16.0)
Neutrophils Absolute: 1.27
Platelets: 198 (ref 150–399)
WBC: 3.1

## 2020-11-15 LAB — HEPATIC FUNCTION PANEL
ALT: 14 (ref 7–35)
AST: 24 (ref 13–35)
Alkaline Phosphatase: 72 (ref 25–125)
Bilirubin, Total: 0.4

## 2020-11-15 LAB — COMPREHENSIVE METABOLIC PANEL
Albumin: 4.3 (ref 3.5–5.0)
Calcium: 9.2 (ref 8.7–10.7)

## 2020-11-15 LAB — CBC: RBC: 4.26 (ref 3.87–5.11)

## 2020-11-15 NOTE — Progress Notes (Signed)
[facility]  [address] [place],  [place]  [address] [phone]  Clinic Day:  11/16/2020  Referring physician: Garnetta Buddy I, NP   CHIEF COMPLAINT:  CC:   A 60 year old female with history of stage IIIB rectal cancer here for 2 week evaluation  Current Treatment:   Plan for adjuvant FOLFOX chemotherapy every 2 weeks for 8 cycles   HISTORY OF PRESENT ILLNESS:  Tanya Mcclure is a 60 y.o. female with clinical stage IIIB ([id number]) rectal cancer diagnosed in December 2021.  She presented with daily rectal bleeding with bright red blood, chronic diarrhea and stool urgency.  She saw Dr. Melina Copa and underwent colonoscopy in December. This revealed a rectal mass beginning at about 2 cm from the anal verge and was noted to obstruct 50% of the circumference of the rectum.  Benign polyps were also present and removed.  She had never undergone colonoscopy prior to this procedure. Biopsy revealed invasive adenocarcinoma. EKG D revealed an ulcer, so ibuprofen was discontinued and she was placed on pantoprazole 40 mg twice daily. CT abdomen and pelvis revealed several small perirectal and presacral lymph nodes, highly concerning for metastatic involvement.  No other evidence of metastatic disease was observed.  She was seen by Dr. Lilia Pro in consultation to discuss surgical resection and recommended neoadjuvant chemoradiation.  She completed neoadjuvant chemoradiation with infusional 5 fluorouracil in February.  She developed severe hypocalcemia while on treatment requiring oral and IV replacement.  After neoadjuvant treatment she was referred to Dr. Marcello Moores in [place] for possible low anterior resection. MRI after neoadjuvant therapy revealed a 5.8 cm circumferential distal rectal tumor abutting the internal anal sphincter. Bulky residual tumor posteriorly with gross perirectal extension along the left posterior aspect, abutting/involving the left levator ani muscle.  Due to  the tumor involving the pelvic floor muscles and abutting the anal sphincter, abdominoperitoneal resection was recommended.  She underwent abdominoperitoneal resection on April 13th.  Pathology revealed a residual 3 cm invasive adenocarcinoma extending into the perirectal connective tissue.  Nineteen lymph nodes were negative for metastasis. Margins were negative and no lymphovascular or perineural invasion was seen.   We recommended adjuvant FOLFOX chemotherapy for 8 cycles.  She had her 1st cycle on May 31st.  She had difficulty tolerating her 1st cycle due to severe nausea and vomiting for 3 days despite receiving Aloxi IV with her chemotherapy.   She states the Zofran 4 mg and Compazine 10 mg were ineffective. We added Emend with the 2nd cycle and told her she could use $RemoveB'8mg'eapHalop$  of Zofran every 8 hours as needed.  INTERVAL HISTORY:  Tanya Mcclure is here today for repeat clinical assessment prior to a 4th cycle of adjuvant FOLFOX chemotherapy. She has been well since last visit. She states this most recent cycle has been better than the others as she did not experience any nausea or vomiting. She denies fever, chills, nausea or vomiting. She denies shortness of breath, chest pain or cough. She denies issue with bowel or bladder. CBC today reveals ANC 1.27. CMP is unremarkable.  REVIEW OF SYSTEMS:  Review of Systems  Constitutional:  Negative for appetite change, chills, fatigue, fever and unexpected weight change.  HENT:   Negative for lump/mass, mouth sores and sore throat.   Respiratory:  Negative for cough and shortness of breath.   Cardiovascular:  Negative for chest pain and leg swelling.  Gastrointestinal:  Negative for abdominal pain, constipation, diarrhea, nausea and vomiting.  Endocrine: Negative for hot  flashes.  Genitourinary:  Negative for difficulty urinating, dysuria, frequency and hematuria.   Musculoskeletal:  Negative for arthralgias, back pain and myalgias.  Skin:  Negative for rash.   Neurological:  Negative for dizziness and headaches.  Hematological:  Negative for adenopathy. Does not bruise/bleed easily.  Psychiatric/Behavioral:  Negative for depression and sleep disturbance. The patient is not nervous/anxious.     VITALS:  Blood pressure 136/69, pulse 79, temperature 98.8 F (37.1 C), temperature source Oral, resp. rate 18, height 5' (1.524 m), weight 173 lb 8 oz (78.7 kg), SpO2 96 %.  Wt Readings from Last 3 Encounters:  11/16/20 173 lb 8 oz (78.7 kg)  11/08/20 173 lb 3 oz (78.6 kg)  11/05/20 173 lb 11.2 oz (78.8 kg)    Body mass index is 33.88 kg/m.  Performance status (ECOG): 0 - Asymptomatic  PHYSICAL EXAM:  Physical Exam Vitals and nursing note reviewed.  Constitutional:      General: She is not in acute distress.    Appearance: Normal appearance.  HENT:     Head: Normocephalic and atraumatic.     Mouth/Throat:     Mouth: Mucous membranes are moist.     Pharynx: Oropharynx is clear. No oropharyngeal exudate or posterior oropharyngeal erythema.  Eyes:     General: No scleral icterus.    Extraocular Movements: Extraocular movements intact.     Conjunctiva/sclera: Conjunctivae normal.     Pupils: Pupils are equal, round, and reactive to light.  Cardiovascular:     Rate and Rhythm: Normal rate and regular rhythm.     Heart sounds: Normal heart sounds. No murmur heard.   No friction rub. No gallop.  Pulmonary:     Effort: Pulmonary effort is normal.     Breath sounds: Normal breath sounds. No wheezing, rhonchi or rales.  Abdominal:     General: There is no distension.     Palpations: Abdomen is soft. There is no hepatomegaly, splenomegaly or mass.     Tenderness: There is no abdominal tenderness.  Musculoskeletal:        General: Normal range of motion.     Cervical back: Normal range of motion and neck supple. No tenderness.     Right lower leg: No edema.     Left lower leg: No edema.  Lymphadenopathy:     Cervical: No cervical adenopathy.   Skin:    General: Skin is warm and dry.     Coloration: Skin is not jaundiced.     Findings: No rash.  Neurological:     Mental Status: She is alert and oriented to person, place, and time.     Cranial Nerves: No cranial nerve deficit.  Psychiatric:        Mood and Affect: Mood normal.        Behavior: Behavior normal.        Thought Content: Thought content normal.   LABS:   CBC Latest Ref Rng & Units 11/15/2020 11/05/2020 10/22/2020  WBC - 3.1 3.4 3.7  Hemoglobin 12.0 - 16.0 12.0 11.5(A) 11.5(A)  Hematocrit 36 - 46 36 34(A) 34(A)  Platelets 150 - 399 198 161 230   CMP Latest Ref Rng & Units 11/15/2020 11/05/2020 10/22/2020  Glucose 70 - 99 mg/dL - - -  BUN 4 - $R'21 12 13 10  'QI$ Creatinine 0.5 - 1.1 0.6 0.5 0.6  Sodium 137 - 147 137 137 138  Potassium 3.4 - 5.3 3.6 3.8 3.4  Chloride 99 - 108 99 100 101  CO2 13 - 22 29(A) 29(A) 30(A)  Calcium 8.7 - 10.7 9.2 9.1 9.2  Total Protein 6.3 - 8.2 g/dL - - -  Alkaline Phos 25 - 125 72 66 63  AST 13 - 35 $Re'24 26 19  'pLH$ ALT 7 - 35 $Re'14 17 11     'DQl$ Lab Results  Component Value Date   CEA1 1.1 10/01/2020   /  CEA  Date Value Ref Range Status  10/01/2020 1.1 0.0 - 4.7 ng/mL Final    Comment:    (NOTE)                             Nonsmokers          <3.9                             Smokers             <5.6 [employer] Electrochemiluminescence Immunoassay (ECLIA) Values obtained with different assay methods or kits cannot be used interchangeably.  Results cannot be interpreted as absolute evidence of the presence or absence of malignant disease. Performed At: [facility] [place], [place] [id number] Rush Farmer MD XY:[id number]    No results found for: PSA1 No results found for: CAN199 No results found for: CAN125  No results found for: TOTALPROTELP, ALBUMINELP, A1GS, A2GS, BETS, BETA2SER, GAMS, MSPIKE, SPEI No results found for: TIBC, FERRITIN, IRONPCTSAT No results found for: LDH  STUDIES:  No results  found.    HISTORY:   Past Medical History:  Diagnosis Date   Arthritis    Asthma    Cancer ([facility]) 04/2020   colon   Diabetes mellitus without complication (HCC)    Dyspnea    walking,   Family history of adverse reaction to anesthesia    daughter PONV   GERD (gastroesophageal reflux disease)    Hypertension    Hypothyroidism    Thyroid disease     Past Surgical History:  Procedure Laterality Date   ABDOMINAL HYSTERECTOMY     due to fibroids   CHOLECYSTECTOMY     HYSTERECTOMY ABDOMINAL WITH SALPINGO-OOPHORECTOMY     POLYPECTOMY     PORTA CATH INSERTION     TUBAL LIGATION     XI ROBOTIC ASSISTED LOWER ANTERIOR RESECTION N/A 08/22/2020   Procedure: XI ROBOTIC ABDOMINAL PERIITONEAL RESECTION;  Surgeon: Leighton Ruff, MD;  Location: [facility];  Service: General;  Laterality: N/A;  4 HOURS    Family History  Problem Relation Age of Onset   Diabetes Mother    Hypertension Mother    Heart disease Mother    Heart attack Mother    Diabetes Father    Hypertension Father    Diabetes Sister    COPD Sister     Social History:  reports that she has never smoked. She has never used smokeless tobacco. She reports previous alcohol use. She reports that she does not use drugs.The patient is alone today.  Allergies:  Allergies  Allergen Reactions   Penicillins Rash    Current Medications: Current Outpatient Medications  Medication Sig Dispense Refill   Accu-Chek Softclix Lancets lancets USE TO CHECK BLOOD GLUCOSE TWICE DAILY.     albuterol (PROVENTIL) (2.5 MG/3ML) 0.083% nebulizer solution Take 2.5 mg by nebulization every 6 (six) hours as needed for wheezing or shortness of breath. (Patient not taking: Reported on 10/16/2020)  albuterol (VENTOLIN HFA) 108 (90 Base) MCG/ACT inhaler Inhale 2 puffs into the lungs every 6 (six) hours as needed for shortness of breath or wheezing.     aspirin 81 MG EC tablet Take 81 mg by mouth daily. (Patient not taking: No sig reported)      atorvastatin (LIPITOR) 40 MG tablet Take 40 mg by mouth daily.     cetirizine (ZYRTEC) 10 MG tablet Take 10 mg by mouth daily as needed for allergies.     dicyclomine (BENTYL) 10 MG capsule Take 1 capsule (10 mg total) by mouth 4 (four) times daily -  before meals and at bedtime. (Patient not taking: Reported on 10/16/2020) 90 capsule 0   diphenoxylate-atropine (LOMOTIL) 2.5-0.025 MG tablet Take 2 tablets by mouth 4 (four) times daily as needed for diarrhea or loose stools. 60 tablet 0   furosemide (LASIX) 20 MG tablet Take 20 mg by mouth daily.     gabapentin (NEURONTIN) 300 MG capsule Take 300 mg by mouth 2 (two) times daily.     glucose blood (ACCU-CHEK AVIVA PLUS) test strip Check blood glucose twice daily and as needed. Dx E 11.65     HYDROcodone-acetaminophen (NORCO) 7.5-325 MG tablet Take 1 tablet by mouth every 6 (six) hours as needed for moderate pain. 60 tablet 0   Lancets Misc. (ACCU-CHEK FASTCLIX LANCET) KIT Check blood glucose twice daily. Dx E 11.65     levothyroxine (SYNTHROID) 100 MCG tablet Take 100 mcg by mouth daily before breakfast.     metFORMIN (GLUCOPHAGE-XR) 500 MG 24 hr tablet Take 500 mg by mouth 2 (two) times daily with a meal.     montelukast (SINGULAIR) 10 MG tablet Take 10 mg by mouth daily.     ondansetron (ZOFRAN) 4 MG tablet Take 1 tablet (4 mg total) by mouth every 4 (four) hours as needed for nausea. 90 tablet 3   pantoprazole (PROTONIX) 40 MG tablet Take 40 mg by mouth 2 (two) times daily.     potassium chloride SA (KLOR-CON) 20 MEQ tablet Take 1 tablet (20 mEq total) by mouth 2 (two) times daily. 60 tablet 3   prochlorperazine (COMPAZINE) 10 MG tablet Take 1 tablet (10 mg total) by mouth every 6 (six) hours as needed for nausea or vomiting. 90 tablet 3   Current Facility-Administered Medications  Medication Dose Route Frequency Provider Last Rate Last Admin   triamcinolone acetonide (KENALOG) 10 MG/ML injection 10 mg  10 mg Other Once Landis Martins, DPM        triamcinolone acetonide (KENALOG-40) injection 20 mg  20 mg Other Once Landis Martins, DPM         ASSESSMENT & PLAN:   Assessment:  1.  Clinical stage IIIB ([id number]) rectal cancer, based on CT reveal any several small perirectal and presacral lymph nodes, which were highly concerning for metastatic involvement. She underwent abdominoperitoneal resection about 6 weeks ago and lymph nodes were negative.   She is now receiving adjuvant FOLFOX chemotherapy with plans for 8 cycles. She is due cycle 4 next week and has an ANC of 1.27. We discussed options of repeating labs on Monday or using zarxio injection today and proceeding with treatment as planned. Side effects of zarxio were reviewed and patient agreed to receive injection today.   2.  Benign polyps, removed at time of colonoscopy.   3.  History of peptic ulcer disease.   4.  Hypokalemia, resolved with oral supplementation.  She remains on furosemide, so she will continue potassium.  5.  Chemotherapy-induced nausea and vomiting despite multiple medications including IV Aloxi.  This improved somewhat with IV Emend and Zofran 8 mg every 8 hours as needed alternating with Compazine.    6. Anemia, likely due to chemotherapy, which is stable.   Plan:  We will obtain authorization for and administer zarxio today. I provided her with samples of loratadine and instructed her to begin taking them today. She will return to clinic in 2 weeks for repeat evaluation prior to cycle 5 FOLFOX.   She verbalizes understanding of and agreement to the plans discussed today. She knows to call the office should any new questions or concerns arise.    [name], NP

## 2020-11-16 ENCOUNTER — Other Ambulatory Visit: Payer: Self-pay

## 2020-11-16 ENCOUNTER — Inpatient Hospital Stay (INDEPENDENT_AMBULATORY_CARE_PROVIDER_SITE_OTHER): Payer: Medicaid Other | Admitting: Hematology and Oncology

## 2020-11-16 ENCOUNTER — Telehealth: Payer: Self-pay | Admitting: Hematology and Oncology

## 2020-11-16 ENCOUNTER — Encounter: Payer: Self-pay | Admitting: Hematology and Oncology

## 2020-11-16 ENCOUNTER — Inpatient Hospital Stay: Payer: Medicaid Other

## 2020-11-16 ENCOUNTER — Other Ambulatory Visit: Payer: Self-pay | Admitting: Pharmacist

## 2020-11-16 VITALS — BP 117/60 | HR 80 | Temp 99.0°F | Resp 18 | Ht 60.0 in | Wt 172.8 lb

## 2020-11-16 VITALS — BP 136/69 | HR 79 | Temp 98.8°F | Resp 18 | Ht 60.0 in | Wt 173.5 lb

## 2020-11-16 DIAGNOSIS — Z5189 Encounter for other specified aftercare: Secondary | ICD-10-CM | POA: Diagnosis not present

## 2020-11-16 DIAGNOSIS — C20 Malignant neoplasm of rectum: Secondary | ICD-10-CM | POA: Diagnosis not present

## 2020-11-16 DIAGNOSIS — Z5111 Encounter for antineoplastic chemotherapy: Secondary | ICD-10-CM | POA: Diagnosis not present

## 2020-11-16 DIAGNOSIS — E876 Hypokalemia: Secondary | ICD-10-CM | POA: Diagnosis not present

## 2020-11-16 DIAGNOSIS — D649 Anemia, unspecified: Secondary | ICD-10-CM | POA: Diagnosis not present

## 2020-11-16 MED ORDER — FILGRASTIM-SNDZ 480 MCG/0.8ML IJ SOSY
PREFILLED_SYRINGE | INTRAMUSCULAR | Status: AC
  Filled 2020-11-16: qty 0.8

## 2020-11-16 MED ORDER — FILGRASTIM-SNDZ 480 MCG/0.8ML IJ SOSY
480.0000 ug | PREFILLED_SYRINGE | Freq: Once | INTRAMUSCULAR | Status: AC
  Administered 2020-11-16: 480 ug via SUBCUTANEOUS

## 2020-11-16 NOTE — Patient Instructions (Addendum)
Filgrastim, G-CSF injection What is this medication? FILGRASTIM, G-CSF (fil GRA stim) is a granulocyte colony-stimulating factor that stimulates the growth of neutrophils, a type of white blood cell (WBC) important in the body's fight against infection. It is used to reduce the incidence of fever and infection in patients with certain types of cancer who are receiving chemotherapy that affects the bone marrow, to stimulate blood cell production for removal of WBCs from the body prior to a bone marrow transplantation, to reduce the incidence of fever and infection in patients who have severe chronic neutropenia, and to improve survival outcomes followinghigh-dose radiation exposure that is toxic to the bone marrow. This medicine may be used for other purposes; ask your health care provider orpharmacist if you have questions. COMMON BRAND NAME(S): Neupogen, Nivestym, Releuko, Zarxio What should I tell my care team before I take this medication? They need to know if you have any of these conditions: kidney disease latex allergy ongoing radiation therapy sickle cell disease an unusual or allergic reaction to filgrastim, pegfilgrastim, other medicines, foods, dyes, or preservatives pregnant or trying to get pregnant breast-feeding How should I use this medication? This medicine is for injection under the skin or infusion into a vein. As an infusion into a vein, it is usually given by a health care professional in a hospital or clinic setting. If you get this medicine at home, you will be taught how to prepare and give this medicine. Refer to the Instructions for Use that come with your medication packaging. Use exactly as directed. Take your medicine at regular intervals. Do not take your medicine more often thandirected. It is important that you put your used needles and syringes in a special sharps container. Do not put them in a trash can. If you do not have a sharpscontainer, call your pharmacist or  healthcare provider to get one. Talk to your pediatrician regarding the use of this medicine in children. While this drug may be prescribed for children as young as 7 months for selectedconditions, precautions do apply. Overdosage: If you think you have taken too much of this medicine contact apoison control center or emergency room at once. NOTE: This medicine is only for you. Do not share this medicine with others. What if I miss a dose? It is important not to miss your dose. Call your doctor or health careprofessional if you miss a dose. What may interact with this medication? This medicine may interact with the following medications: medicines that may cause a release of neutrophils, such as lithium This list may not describe all possible interactions. Give your health care provider a list of all the medicines, herbs, non-prescription drugs, or dietary supplements you use. Also tell them if you smoke, drink alcohol, or use illegaldrugs. Some items may interact with your medicine. What should I watch for while using this medication? Your condition will be monitored carefully while you are receiving thismedicine. You may need blood work done while you are taking this medicine. Talk to your health care provider about your risk of cancer. You may be more atrisk for certain types of cancer if you take this medicine. What side effects may I notice from receiving this medication? Side effects that you should report to your doctor or health care professionalas soon as possible: allergic reactions like skin rash, itching or hives, swelling of the face, lips, or tongue back pain dizziness or feeling faint fever pain, redness, or irritation at site where injected pinpoint red spots on the skin  shortness of breath or breathing problems signs and symptoms of kidney injury like trouble passing urine, change in the amount of urine, or red or dark-brown urine stomach or side pain, or pain at the  shoulder swelling tiredness unusual bleeding or bruising Side effects that usually do not require medical attention (report to yourdoctor or health care professional if they continue or are bothersome): bone pain cough diarrhea hair loss headache muscle pain This list may not describe all possible side effects. Call your doctor for medical advice about side effects. You may report side effects to FDA at1-800-FDA-1088. Where should I keep my medication? Keep out of the reach of children. Store in a refrigerator between 2 and 8 degrees C (36 and 46 degrees F). Do not freeze. Keep in carton to protect from light. Throw away this medicine if vials or syringes are left out of the refrigerator for more than 24 hours. Throw awayany unused medicine after the expiration date. NOTE: This sheet is a summary. It may not cover all possible information. If you have questions about this medicine, talk to your doctor, pharmacist, orhealth care provider.  2022 Elsevier/Gold Standard (2019-05-19 18:47:55) ENCOURAGE PATIENT TO TAKE CLARITIN ONE BY MOUTH DAILY FOR BONE PAIN WHILE TAKING THIS SHOT. PATIENT VERBALIZED UNDERSTANDING.

## 2020-11-16 NOTE — Telephone Encounter (Signed)
Per 7/8 los next appt scheduled and confirmed by patient

## 2020-11-19 ENCOUNTER — Inpatient Hospital Stay: Payer: Medicaid Other

## 2020-11-19 ENCOUNTER — Telehealth: Payer: Self-pay | Admitting: Oncology

## 2020-11-19 ENCOUNTER — Encounter: Payer: Self-pay | Admitting: Oncology

## 2020-11-19 ENCOUNTER — Other Ambulatory Visit: Payer: Self-pay | Admitting: Hematology and Oncology

## 2020-11-19 ENCOUNTER — Other Ambulatory Visit: Payer: Medicaid Other

## 2020-11-19 ENCOUNTER — Other Ambulatory Visit: Payer: Self-pay

## 2020-11-19 DIAGNOSIS — C20 Malignant neoplasm of rectum: Secondary | ICD-10-CM

## 2020-11-19 LAB — HEPATIC FUNCTION PANEL
ALT: 17 (ref 7–35)
AST: 33 (ref 13–35)
Alkaline Phosphatase: 84 (ref 25–125)
Bilirubin, Total: 0.3

## 2020-11-19 LAB — COMPREHENSIVE METABOLIC PANEL
Albumin: 4.1 (ref 3.5–5.0)
Calcium: 9.2 (ref 8.7–10.7)

## 2020-11-19 LAB — CBC AND DIFFERENTIAL
HCT: 35 — AB (ref 36–46)
Hemoglobin: 11.7 — AB (ref 12.0–16.0)
Neutrophils Absolute: 4.58
Platelets: 130 — AB (ref 150–399)
WBC: 6.1

## 2020-11-19 LAB — BASIC METABOLIC PANEL
BUN: 11 (ref 4–21)
CO2: 27 — AB (ref 13–22)
Chloride: 101 (ref 99–108)
Creatinine: 0.6 (ref 0.5–1.1)
Glucose: 173
Potassium: 3.9 (ref 3.4–5.3)
Sodium: 138 (ref 137–147)

## 2020-11-19 LAB — CBC: RBC: 4.12 (ref 3.87–5.11)

## 2020-11-19 NOTE — Telephone Encounter (Signed)
Patient rescheduled Lab Appt today to 9:45 am - Notified Scientist, clinical (histocompatibility and immunogenetics)

## 2020-11-20 ENCOUNTER — Inpatient Hospital Stay: Payer: Medicaid Other

## 2020-11-20 VITALS — HR 80 | Temp 99.0°F | Resp 18 | Ht 60.0 in | Wt 172.0 lb

## 2020-11-20 DIAGNOSIS — C20 Malignant neoplasm of rectum: Secondary | ICD-10-CM

## 2020-11-20 DIAGNOSIS — Z5111 Encounter for antineoplastic chemotherapy: Secondary | ICD-10-CM | POA: Diagnosis not present

## 2020-11-20 MED ORDER — SODIUM CHLORIDE 0.9 % IV SOLN
2400.0000 mg/m2 | INTRAVENOUS | Status: DC
  Administered 2020-11-20: 4400 mg via INTRAVENOUS
  Filled 2020-11-20: qty 88

## 2020-11-20 MED ORDER — DEXAMETHASONE SODIUM PHOSPHATE 100 MG/10ML IJ SOLN
10.0000 mg | Freq: Once | INTRAMUSCULAR | Status: AC
  Administered 2020-11-20: 10 mg via INTRAVENOUS
  Filled 2020-11-20: qty 10

## 2020-11-20 MED ORDER — PALONOSETRON HCL INJECTION 0.25 MG/5ML
0.2500 mg | Freq: Once | INTRAVENOUS | Status: AC
  Administered 2020-11-20: 0.25 mg via INTRAVENOUS

## 2020-11-20 MED ORDER — FLUOROURACIL CHEMO INJECTION 2.5 GM/50ML
400.0000 mg/m2 | Freq: Once | INTRAVENOUS | Status: AC
  Administered 2020-11-20: 750 mg via INTRAVENOUS
  Filled 2020-11-20: qty 15

## 2020-11-20 MED ORDER — OXALIPLATIN CHEMO INJECTION 100 MG/20ML
83.0000 mg/m2 | Freq: Once | INTRAVENOUS | Status: AC
  Administered 2020-11-20: 150 mg via INTRAVENOUS
  Filled 2020-11-20: qty 20

## 2020-11-20 MED ORDER — DEXTROSE 5 % IV SOLN
Freq: Once | INTRAVENOUS | Status: AC
  Filled 2020-11-20: qty 250

## 2020-11-20 MED ORDER — PALONOSETRON HCL INJECTION 0.25 MG/5ML
INTRAVENOUS | Status: AC
  Filled 2020-11-20: qty 5

## 2020-11-20 MED ORDER — LEUCOVORIN CALCIUM INJECTION 350 MG
400.0000 mg/m2 | Freq: Once | INTRAVENOUS | Status: AC
  Administered 2020-11-20: 732 mg via INTRAVENOUS
  Filled 2020-11-20: qty 35

## 2020-11-20 MED ORDER — FOSAPREPITANT DIMEGLUMINE INJECTION 150 MG
150.0000 mg | Freq: Once | INTRAVENOUS | Status: AC
Start: 2020-11-20 — End: 2020-11-20
  Administered 2020-11-20: 150 mg via INTRAVENOUS
  Filled 2020-11-20: qty 150

## 2020-11-20 NOTE — Patient Instructions (Signed)
Tanya Mcclure  Discharge Instructions: Thank you for choosing [facility] to provide your oncology and hematology care.  If you have a lab appointment with the [facility], please go directly to the [facility] and check in at the registration area.   Wear comfortable clothing and clothing appropriate for easy access to any Portacath or PICC line.   We strive to give you quality time with your provider. You may need to reschedule your appointment if you arrive late (15 or more minutes).  Arriving late affects you and other patients whose appointments are after yours.  Also, if you miss three or more appointments without notifying the office, you may be dismissed from the clinic at the provider's discretion.      For prescription refill requests, have your pharmacy contact our office and allow 72 hours for refills to be completed.    Today you received the following chemotherapy and/or immunotherapy agents Oxaliplatin,Leucovorin,Fluorouracil     To help prevent nausea and vomiting after your treatment, we encourage you to take your nausea medication as directed.  BELOW ARE SYMPTOMS THAT SHOULD BE REPORTED IMMEDIATELY: *FEVER GREATER THAN 100.4 F (38 C) OR HIGHER *CHILLS OR SWEATING *NAUSEA AND VOMITING THAT IS NOT CONTROLLED WITH YOUR NAUSEA MEDICATION *UNUSUAL SHORTNESS OF BREATH *UNUSUAL BRUISING OR BLEEDING *URINARY PROBLEMS (pain or burning when urinating, or frequent urination) *BOWEL PROBLEMS (unusual diarrhea, constipation, pain near the anus) TENDERNESS IN MOUTH AND THROAT WITH OR WITHOUT PRESENCE OF ULCERS (sore throat, sores in mouth, or a toothache) UNUSUAL RASH, SWELLING OR PAIN  UNUSUAL VAGINAL DISCHARGE OR ITCHING   Items with * indicate a potential emergency and should be followed up as soon as possible or go to the Emergency Department if any problems should occur.  Please show the CHEMOTHERAPY ALERT CARD or IMMUNOTHERAPY ALERT  CARD at check-in to the Emergency Department and triage nurse.  Should you have questions after your visit or need to cancel or reschedule your appointment, please contact [facility]  Dept: [phone]  and follow the prompts.  Office hours are 8:00 a.m. to 4:30 p.m. Monday - Friday. Please note that voicemails left after 4:00 p.m. may not be returned until the following business day.  We are closed weekends and major holidays. You have access to a nurse at all times for urgent questions. Please call the main number to the clinic Dept: [phone] and follow the prompts.  For any non-urgent questions, you may also contact your provider using MyChart. We now offer e-Visits for anyone 57 and older to request care online for non-urgent symptoms. For details visit [email or web address].   Also download the MyChart app! Go to the app store, search "MyChart", open the app, select [facility], and log in with your MyChart username and password.  Due to Covid, a mask is required upon entering the hospital/clinic. If you do not have a mask, one will be given to you upon arrival. For doctor visits, patients may have 1 support person aged 36 or older with them. For treatment visits, patients cannot have anyone with them due to current Covid guidelines and our immunocompromised population.

## 2020-11-20 NOTE — Progress Notes (Signed)
1543: PT STABLE AT TIME OF DISCHARGE

## 2020-11-21 ENCOUNTER — Encounter: Payer: Self-pay | Admitting: Oncology

## 2020-11-22 ENCOUNTER — Inpatient Hospital Stay: Payer: Medicaid Other

## 2020-11-22 ENCOUNTER — Other Ambulatory Visit: Payer: Self-pay

## 2020-11-22 VITALS — BP 117/57 | HR 67 | Temp 97.9°F | Resp 18 | Ht 60.0 in | Wt 173.0 lb

## 2020-11-22 DIAGNOSIS — Z5111 Encounter for antineoplastic chemotherapy: Secondary | ICD-10-CM | POA: Diagnosis not present

## 2020-11-22 DIAGNOSIS — C20 Malignant neoplasm of rectum: Secondary | ICD-10-CM

## 2020-11-22 MED ORDER — SODIUM CHLORIDE 0.9% FLUSH
10.0000 mL | INTRAVENOUS | Status: DC | PRN
  Administered 2020-11-22: 10 mL
  Filled 2020-11-22: qty 10

## 2020-11-22 MED ORDER — HEPARIN SOD (PORK) LOCK FLUSH 100 UNIT/ML IV SOLN
500.0000 [IU] | Freq: Once | INTRAVENOUS | Status: AC | PRN
  Administered 2020-11-22: 500 [IU]
  Filled 2020-11-22: qty 5

## 2020-11-22 NOTE — Patient Instructions (Signed)
Fluorouracil, 5-FU injection What is this medication? FLUOROURACIL, 5-FU (flure oh YOOR a sil) is a chemotherapy drug. It slows the growth of cancer cells. This medicine is used to treat many types of cancer like breast cancer, colon or rectal cancer, pancreatic cancer, and stomach cancer. This medicine may be used for other purposes; ask your health care provider or pharmacist if you have questions. COMMON BRAND NAME(S): Adrucil What should I tell my care team before I take this medication? They need to know if you have any of these conditions: blood disorders dihydropyrimidine dehydrogenase (DPD) deficiency infection (especially a virus infection such as chickenpox, cold sores, or herpes) kidney disease liver disease malnourished, poor nutrition recent or ongoing radiation therapy an unusual or allergic reaction to fluorouracil, other chemotherapy, other medicines, foods, dyes, or preservatives pregnant or trying to get pregnant breast-feeding How should I use this medication? This drug is given as an infusion or injection into a vein. It is administered in a hospital or clinic by a specially trained health care professional. Talk to your pediatrician regarding the use of this medicine in children. Special care may be needed. Overdosage: If you think you have taken too much of this medicine contact a poison control center or emergency room at once. NOTE: This medicine is only for you. Do not share this medicine with others. What if I miss a dose? It is important not to miss your dose. Call your doctor or health care professional if you are unable to keep an appointment. What may interact with this medication? Do not take this medicine with any of the following medications: live virus vaccines This medicine may also interact with the following medications: medicines that treat or prevent blood clots like warfarin, enoxaparin, and dalteparin This list may not describe all possible  interactions. Give your health care provider a list of all the medicines, herbs, non-prescription drugs, or dietary supplements you use. Also tell them if you smoke, drink alcohol, or use illegal drugs. Some items may interact with your medicine. What should I watch for while using this medication? Visit your doctor for checks on your progress. This drug may make you feel generally unwell. This is not uncommon, as chemotherapy can affect healthy cells as well as cancer cells. Report any side effects. Continue your course of treatment even though you feel ill unless your doctor tells you to stop. In some cases, you may be given additional medicines to help with side effects. Follow all directions for their use. Call your doctor or health care professional for advice if you get a fever, chills or sore throat, or other symptoms of a cold or flu. Do not treat yourself. This drug decreases your body's ability to fight infections. Try to avoid being around people who are sick. This medicine may increase your risk to bruise or bleed. Call your doctor or health care professional if you notice any unusual bleeding. Be careful brushing and flossing your teeth or using a toothpick because you may get an infection or bleed more easily. If you have any dental work done, tell your dentist you are receiving this medicine. Avoid taking products that contain aspirin, acetaminophen, ibuprofen, naproxen, or ketoprofen unless instructed by your doctor. These medicines may hide a fever. Do not become pregnant while taking this medicine. Women should inform their doctor if they wish to become pregnant or think they might be pregnant. There is a potential for serious side effects to an unborn child. Talk to your health care   professional or pharmacist for more information. Do not breast-feed an infant while taking this medicine. Men should inform their doctor if they wish to father a child. This medicine may lower sperm  counts. Do not treat diarrhea with over the counter products. Contact your doctor if you have diarrhea that lasts more than 2 days or if it is severe and watery. This medicine can make you more sensitive to the sun. Keep out of the sun. If you cannot avoid being in the sun, wear protective clothing and use sunscreen. Do not use sun lamps or tanning beds/booths. What side effects may I notice from receiving this medication? Side effects that you should report to your doctor or health care professional as soon as possible: allergic reactions like skin rash, itching or hives, swelling of the face, lips, or tongue low blood counts - this medicine may decrease the number of white blood cells, red blood cells and platelets. You may be at increased risk for infections and bleeding. signs of infection - fever or chills, cough, sore throat, pain or difficulty passing urine signs of decreased platelets or bleeding - bruising, pinpoint red spots on the skin, black, tarry stools, blood in the urine signs of decreased red blood cells - unusually weak or tired, fainting spells, lightheadedness breathing problems changes in vision chest pain mouth sores nausea and vomiting pain, swelling, redness at site where injected pain, tingling, numbness in the hands or feet redness, swelling, or sores on hands or feet stomach pain unusual bleeding Side effects that usually do not require medical attention (report to your doctor or health care professional if they continue or are bothersome): changes in finger or toe nails diarrhea dry or itchy skin hair loss headache loss of appetite sensitivity of eyes to the light stomach upset unusually teary eyes This list may not describe all possible side effects. Call your doctor for medical advice about side effects. You may report side effects to FDA at 1-800-FDA-1088. Where should I keep my medication? This drug is given in a hospital or clinic and will not be  stored at home. NOTE: This sheet is a summary. It may not cover all possible information. If you have questions about this medicine, talk to your doctor, pharmacist, or health care provider.  2022 Elsevier/Gold Standard (2019-03-29 15:00:03)  

## 2020-11-23 ENCOUNTER — Other Ambulatory Visit: Payer: Medicaid Other

## 2020-11-23 NOTE — Progress Notes (Signed)
Tanya Mcclure  [address] [place],  [place]  [address] [phone]  Clinic Day:  11/30/2020  Referring physician: Garnetta Buddy I, NP  This document serves as a record of services personally performed by Tanya Poisson, MD. It was created on their behalf by Mcclure,Tanya E, a trained medical scribe. The creation of this record is based on the scribe's personal observations and the provider's statements to them.  CHIEF COMPLAINT:  CC:   Stage IIIB rectal cancer   Current Treatment:   Plan for adjuvant FOLFOX chemotherapy every 2 weeks for 8 cycles   HISTORY OF PRESENT ILLNESS:  Tanya Mcclure is a 60 y.o. female with clinical stage IIIB ([id number]) rectal cancer diagnosed in December 2021.  She presented with daily rectal bleeding with bright red blood, chronic diarrhea and stool urgency.  She saw Dr. Melina Copa and underwent colonoscopy in December. This revealed a rectal mass beginning at about 2 cm from the anal verge and was noted to obstruct 50% of the circumference of the rectum.  Benign polyps were also present and removed.  She had never undergone colonoscopy prior to this procedure. Biopsy revealed invasive adenocarcinoma. EKG D revealed an ulcer, so ibuprofen was discontinued and she was placed on pantoprazole 40 mg twice daily. CT abdomen and pelvis revealed several small perirectal and presacral lymph nodes, highly concerning for metastatic involvement.  No other evidence of metastatic disease was observed.  She was seen by Dr. Lilia Mcclure in consultation to discuss surgical resection and recommended neoadjuvant chemoradiation.  She completed neoadjuvant chemoradiation with infusional 5 fluorouracil in February.  She developed severe hypocalcemia while on treatment requiring oral and IV replacement.  After neoadjuvant treatment she was referred to Dr. Marcello Mcclure in [place] for possible low anterior resection. MRI after neoadjuvant therapy revealed a 5.8 cm  circumferential distal rectal tumor abutting the internal anal sphincter. Bulky residual tumor posteriorly with gross perirectal extension along the left posterior aspect, abutting/involving the left levator ani muscle.  Due to the tumor involving the pelvic floor muscles and abutting the anal sphincter, abdominoperitoneal resection was recommended.  She underwent abdominoperitoneal resection on April 2022.  Pathology revealed a residual 3 cm invasive adenocarcinoma extending into the perirectal connective tissue.  Nineteen lymph nodes were negative for metastasis. Margins were negative and no lymphovascular or perineural invasion was seen.   We recommended adjuvant FOLFOX chemotherapy for 8 cycles.  She had her 1st cycle on May 31st.  She had difficulty tolerating her 1st cycle due to severe nausea and vomiting for 3 days despite receiving Aloxi IV with her chemotherapy.   She states the Zofran 4 mg and Compazine 10 mg were ineffective. We added Emend with the 2nd cycle and told her she could use $RemoveB'8mg'BbpXwveF$  of Zofran every 8 hours as needed.  On July 7th her ANC was down to 1.27, and she was given 1 dose of Zarxio the next day.  They then proceeded with her 4th cycle on July 12th.    INTERVAL HISTORY:  Tanya Mcclure is here for follow up prior to a 5th cycle of FOLFOX.  She states that she developed extensive swelling of the right knee with a pocket of fluid.  This then evolved to extensive bruising.  She denies any injury or pain of the area.  She notes fatigue.  She also reports numbness and tingling of the hands when she touches cold objects.  Otherwise, she has been doing well.  She has not had significant nausea since  adding in Emend.  Her hemoglobin is fairly stable at 11.5, her white count has decreased from 6.1 to 2.8 with an [place] of 1710, and her platelet count has mildly decreased from 130,000 to 123,000.  Her  appetite is poor, and her weight is stable since her last visit.  She denies fever, chills or other signs  of infection.  She denies nausea, vomiting, bowel issues, or abdominal pain.  She denies sore throat, cough, dyspnea, or chest pain.  REVIEW OF SYSTEMS:  Review of Systems  Constitutional:  Positive for fatigue. Negative for appetite change, chills, fever and unexpected weight change.  HENT:   Negative for lump/mass, mouth sores and sore throat.   Respiratory:  Negative for cough and shortness of breath.   Cardiovascular:  Negative for chest pain and leg swelling.  Gastrointestinal:  Negative for abdominal pain, constipation, diarrhea, nausea and vomiting.  Endocrine: Negative for hot flashes.  Genitourinary:  Negative for difficulty urinating, dysuria, frequency and hematuria.   Musculoskeletal:  Negative for arthralgias, back pain and myalgias.  Skin:  Negative for rash.       Extensive bruising of the entire left knee.  Neurological:  Positive for numbness (of the hands when touching cold objects). Negative for dizziness and headaches.  Hematological:  Negative for adenopathy. Does not bruise/bleed easily.  Psychiatric/Behavioral:  Negative for depression and sleep disturbance. The patient is not nervous/anxious.     VITALS:  Blood pressure 140/77, pulse 73, temperature 98.3 F (36.8 C), temperature source Oral, resp. rate 18, height 5' (1.524 m), weight 171 lb 14.4 oz (78 kg), SpO2 96 %.  Wt Readings from Last 3 Encounters:  11/30/20 171 lb 14.4 oz (78 kg)  11/22/20 173 lb (78.5 kg)  11/20/20 172 lb (78 kg)    Body mass index is 33.57 kg/m.  Performance status (ECOG): 0 - Asymptomatic  PHYSICAL EXAM:  Physical Exam Vitals and nursing note reviewed.  Constitutional:      General: She is not in acute distress.    Appearance: Normal appearance.  HENT:     Head: Normocephalic and atraumatic.     Mouth/Throat:     Mouth: Mucous membranes are moist.     Pharynx: Oropharynx is clear. No oropharyngeal exudate or posterior oropharyngeal erythema.  Eyes:     General: No scleral  icterus.    Extraocular Movements: Extraocular movements intact.     Conjunctiva/sclera: Conjunctivae normal.     Pupils: Pupils are equal, round, and reactive to light.  Cardiovascular:     Rate and Rhythm: Normal rate and regular rhythm.     Heart sounds: Normal heart sounds. No murmur heard.   No friction rub. No gallop.  Pulmonary:     Effort: Pulmonary effort is normal.     Breath sounds: Normal breath sounds. No wheezing, rhonchi or rales.  Abdominal:     General: There is no distension.     Palpations: Abdomen is soft. There is no hepatomegaly, splenomegaly or mass.     Tenderness: There is no abdominal tenderness.     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     Cervical back: Normal range of motion and neck supple. No tenderness.     Right lower leg: No edema.     Left lower leg: No edema.     Comments: Resolving ecchymosis of the entire knee with an area of errythema of the  medial knee.    Lymphadenopathy:     Cervical: No cervical adenopathy.  Skin:    General: Skin is warm and dry.     Coloration: Skin is not jaundiced.     Findings: No rash.  Neurological:     Mental Status: She is alert and oriented to person, place, and time.     Cranial Nerves: No cranial nerve deficit.  Psychiatric:        Mood and Affect: Mood normal.        Behavior: Behavior normal.        Thought Content: Thought content normal.   LABS:   CBC Latest Ref Rng & Units 11/19/2020 11/15/2020 11/05/2020  WBC - 6.1 3.1 3.4  Hemoglobin 12.0 - 16.0 11.7(A) 12.0 11.5(A)  Hematocrit 36 - 46 35(A) 36 34(A)  Platelets 150 - 399 130(A) 198 161   CMP Latest Ref Rng & Units 11/19/2020 11/15/2020 11/05/2020  Glucose 70 - 99 mg/dL - - -  BUN 4 - _0 Creatinine 0.5 - 1.1 0.6 0.6 0.5  Sodium 137 - 147 138 137 137  Potassium 3.4 - 5.3 3.9 3.6 3.8  Chloride 99 - 108 101 99 100  CO2 13 - 22 27(A) 29(A) 29(A)  Calcium 8.7 -  10.7 9.2 9.2 9.1  Total Protein 6.3 - 8.2 g/dL - - -  Alkaline Phos 25 - 125 84 72 66  AST 13 - 35 33 24 26  ALT 7 - 35 _1 Lab Results  Component Value Date   CEA1 1.1 10/01/2020   /  CEA  Date Value Ref Range Status  10/01/2020 1.1 0.0 - 4.7 ng/mL Final    Comment:    (NOTE)                             Nonsmokers          <3.9                             Smokers             <5.6 Roche [employer] Electrochemiluminescence Immunoassay (ECLIA) Values obtained with different assay methods or kits cannot be used interchangeably.  Results cannot be interpreted as absolute evidence of the presence or absence of malignant disease. Performed At: [facility] [place], [place] [id number] Rush Farmer MD QZ:[id number]     STUDIES:  No results found.    HISTORY:   Allergies:  Allergies  Allergen Reactions   Penicillins Rash    Current Medications: Current Outpatient Medications  Medication Sig Dispense Refill   Accu-Chek Softclix Lancets lancets USE TO CHECK BLOOD GLUCOSE TWICE DAILY.     albuterol (PROVENTIL) (2.5 MG/3ML) 0.083% nebulizer solution Take 2.5 mg by nebulization every 6 (six) hours as needed for wheezing or shortness of breath. (Patient not taking: Reported on 10/16/2020)     albuterol (VENTOLIN HFA) 108 (90 Base) MCG/ACT inhaler Inhale 2 puffs into the lungs every 6 (six) hours as needed for shortness of breath or wheezing.     aspirin 81 MG EC tablet Take 81 mg by mouth daily. (Patient not taking: No sig reported)     atorvastatin (LIPITOR) 40 MG tablet Take 40 mg by mouth daily.     cetirizine (ZYRTEC) 10 MG tablet Take 10 mg by mouth daily as needed  for allergies.     dicyclomine (BENTYL) 10 MG capsule Take 1 capsule (10 mg total) by mouth 4 (four) times daily -  before meals and at bedtime. (Patient not taking: Reported on 10/16/2020) 90 capsule 0   diphenoxylate-atropine (LOMOTIL) 2.5-0.025 MG tablet Take 2 tablets by mouth 4  (four) times daily as needed for diarrhea or loose stools. 60 tablet 0   furosemide (LASIX) 20 MG tablet Take 20 mg by mouth daily.     gabapentin (NEURONTIN) 300 MG capsule Take 300 mg by mouth 2 (two) times daily.     glucose blood (ACCU-CHEK AVIVA PLUS) test strip Check blood glucose twice daily and as needed. Dx E 11.65     HYDROcodone-acetaminophen (NORCO) 7.5-325 MG tablet Take 1 tablet by mouth every 6 (six) hours as needed for moderate pain. 60 tablet 0   Lancets Misc. (ACCU-CHEK FASTCLIX LANCET) KIT Check blood glucose twice daily. Dx E 11.65     levothyroxine (SYNTHROID) 100 MCG tablet Take 100 mcg by mouth daily before breakfast.     metFORMIN (GLUCOPHAGE-XR) 500 MG 24 hr tablet Take 500 mg by mouth 2 (two) times daily with a meal.     montelukast (SINGULAIR) 10 MG tablet Take 10 mg by mouth daily.     ondansetron (ZOFRAN) 4 MG tablet Take 1 tablet (4 mg total) by mouth every 4 (four) hours as needed for nausea. 90 tablet 3   pantoprazole (PROTONIX) 40 MG tablet Take 40 mg by mouth 2 (two) times daily.     potassium chloride SA (KLOR-CON) 20 MEQ tablet Take 1 tablet (20 mEq total) by mouth 2 (two) times daily. 60 tablet 3   prochlorperazine (COMPAZINE) 10 MG tablet Take 1 tablet (10 mg total) by mouth every 6 (six) hours as needed for nausea or vomiting. 90 tablet 3   Current Facility-Administered Medications  Medication Dose Route Frequency Provider Last Rate Last Admin   triamcinolone acetonide (KENALOG) 10 MG/ML injection 10 mg  10 mg Other Once Landis Martins, DPM       triamcinolone acetonide (KENALOG-40) injection 20 mg  20 mg Other Once Landis Martins, DPM         ASSESSMENT & PLAN:   Assessment: 1.  Clinical stage IIIB ([id number]) rectal cancer, based on CT reveal any several small perirectal and presacral lymph nodes, which were highly concerning for metastatic involvement. She underwent abdominoperitoneal resection in April and lymph nodes were negative.   She is now  receiving adjuvant FOLFOX chemotherapy with plans for 8 cycles.    2.  Benign polyps, removed at time of colonoscopy.   3.  History of peptic ulcer disease.  4. Anemia, likely due to chemotherapy, which is stable and mild.  Plan:   She will proceed with a 5th cycle of FOLFOX next week. She will then receive 1 dose of Zarxio on August 3rd.  Mcclure will send in a prescription for Zofran 8 mg for nausea.  She will return to clinic in 2 weeks for repeat evaluation prior to cycle 6 FOLFOX.  She verbalizes understanding of and agreement to the plans discussed today. She knows to call the office should any new questions or concerns arise.    Mcclure provided 15 minutes of face-to-face time during this this encounter and > 50% was spent counseling as documented under my assessment and plan.    Mcclure, Rita Ohara, am acting as scribe for Derwood Kaplan, MD  Mcclure have reviewed this report as typed by the  medical scribe, and it is complete and accurate.

## 2020-11-26 ENCOUNTER — Ambulatory Visit: Payer: Medicaid Other | Admitting: Oncology

## 2020-11-30 ENCOUNTER — Other Ambulatory Visit: Payer: Self-pay | Admitting: Oncology

## 2020-11-30 ENCOUNTER — Other Ambulatory Visit: Payer: Self-pay | Admitting: Pharmacist

## 2020-11-30 ENCOUNTER — Inpatient Hospital Stay: Payer: Medicaid Other

## 2020-11-30 ENCOUNTER — Encounter: Payer: Self-pay | Admitting: Oncology

## 2020-11-30 ENCOUNTER — Other Ambulatory Visit: Payer: Medicaid Other

## 2020-11-30 ENCOUNTER — Other Ambulatory Visit: Payer: Self-pay

## 2020-11-30 ENCOUNTER — Telehealth: Payer: Self-pay | Admitting: Oncology

## 2020-11-30 ENCOUNTER — Inpatient Hospital Stay (INDEPENDENT_AMBULATORY_CARE_PROVIDER_SITE_OTHER): Payer: Medicaid Other | Admitting: Oncology

## 2020-11-30 VITALS — BP 140/77 | HR 73 | Temp 98.3°F | Resp 18 | Ht 60.0 in | Wt 171.9 lb

## 2020-11-30 DIAGNOSIS — C20 Malignant neoplasm of rectum: Secondary | ICD-10-CM

## 2020-11-30 LAB — CBC AND DIFFERENTIAL
HCT: 35 — AB (ref 36–46)
Hemoglobin: 11.5 — AB (ref 12.0–16.0)
Neutrophils Absolute: 1.71
Platelets: 123 — AB (ref 150–399)
WBC: 2.8

## 2020-11-30 LAB — BASIC METABOLIC PANEL
BUN: 12 (ref 4–21)
CO2: 28 — AB (ref 13–22)
Chloride: 102 (ref 99–108)
Creatinine: 0.6 (ref 0.5–1.1)
Glucose: 168
Potassium: 3.9 (ref 3.4–5.3)
Sodium: 136 — AB (ref 137–147)

## 2020-11-30 LAB — HEPATIC FUNCTION PANEL
ALT: 15 (ref 7–35)
AST: 32 (ref 13–35)
Alkaline Phosphatase: 64 (ref 25–125)
Bilirubin, Total: 0.4

## 2020-11-30 LAB — CBC: RBC: 4.04 (ref 3.87–5.11)

## 2020-11-30 LAB — COMPREHENSIVE METABOLIC PANEL
Albumin: 3.9 (ref 3.5–5.0)
Calcium: 9 (ref 8.7–10.7)

## 2020-11-30 MED ORDER — ONDANSETRON HCL 8 MG PO TABS: 8.0000 mg | ORAL_TABLET | Freq: Three times a day (TID) | ORAL | 1 refills | Status: DC | PRN

## 2020-11-30 NOTE — Telephone Encounter (Signed)
Per 7/22 LOS, patient scheduled for Aug Appt's.  Gave patient Appt Summary

## 2020-12-03 MED FILL — Fosaprepitant Dimeglumine For IV Infusion 150 MG (Base Eq): INTRAVENOUS | Qty: 5 | Status: AC

## 2020-12-03 MED FILL — Fluorouracil IV Soln 2.5 GM/50ML (50 MG/ML): INTRAVENOUS | Qty: 15 | Status: AC

## 2020-12-03 MED FILL — Dexamethasone Sodium Phosphate Inj 100 MG/10ML: INTRAMUSCULAR | Qty: 1 | Status: AC

## 2020-12-03 MED FILL — Fluorouracil IV Soln 5 GM/100ML (50 MG/ML): INTRAVENOUS | Qty: 88 | Status: AC

## 2020-12-03 MED FILL — Leucovorin Calcium For Inj 350 MG: INTRAMUSCULAR | Qty: 36.6 | Status: AC

## 2020-12-03 MED FILL — Oxaliplatin IV Soln 100 MG/20ML: INTRAVENOUS | Qty: 30 | Status: AC

## 2020-12-04 ENCOUNTER — Encounter: Payer: Self-pay | Admitting: Oncology

## 2020-12-04 ENCOUNTER — Telehealth: Payer: Self-pay | Admitting: Hematology and Oncology

## 2020-12-04 ENCOUNTER — Other Ambulatory Visit: Payer: Self-pay | Admitting: Adult Health

## 2020-12-04 ENCOUNTER — Inpatient Hospital Stay: Payer: Medicaid Other

## 2020-12-04 NOTE — Telephone Encounter (Signed)
Patient rescheduled from 7/26 Infusion, 7/28 Pump D/C to 7/27 Infusion 9:00 am - 7/29 Pump D/C due to COVID Testing

## 2020-12-05 ENCOUNTER — Encounter: Payer: Self-pay | Admitting: Oncology

## 2020-12-05 ENCOUNTER — Ambulatory Visit: Payer: Medicaid Other

## 2020-12-05 ENCOUNTER — Telehealth: Payer: Self-pay | Admitting: Adult Health

## 2020-12-05 ENCOUNTER — Other Ambulatory Visit: Payer: Self-pay | Admitting: Adult Health

## 2020-12-05 DIAGNOSIS — C20 Malignant neoplasm of rectum: Secondary | ICD-10-CM

## 2020-12-05 NOTE — Telephone Encounter (Signed)
I spoke to [name] about her high risk COVID exposure that occurred over last weekend.  Tanya Mcclure, is a 60 year old woman with diabetes, heart disease, obesity, chronic lung disease, and stage IIIB rectal cancer currently undergoing chemotherapy treatment with Folfox every 2 weeks.   Tanya Mcclure lives at home in an apartment with her three grandchildren.  Her 41 year old grandson came home from camp on Friday with COVID 19 symptoms, and tested positive 2-3 days ago.  He has been quarantined, and family has been isolating, wearing masks and practicing deep cleaning since then.  She has not been vaccinated against COVID 19.  Tanya Mcclure has not developed symptoms of COVID 19 and was tested yesterday and that test was negative.  She was going to receive treatment today, however it was canceled over concern that she could possibly receive the immunosuppressive therapy, her medical comorbidities, and become COVID positive and have a bad outcome.  I reviewed with Tanya Mcclure her two options: Receive Folfox tomorrow and have pump d/c on Saturday in [place], or Receive Folfox on Monday with pump d/c on Wednesday here.  She does not have the gas money to drive to [place] this weekend, so she will postpone until Monday.  She will undergo treatment at that time.  If she develops symptoms she will get tested for COVID and will call our office.  I recommended she continue to mask and have her grandchildren quarantined at home in the interim, which she verbalized understanding of.    She plans on receiving the COVID 19 vaccine after chemotherapy.  In 3 weeks, when she is due for treatment she agreed to receive Evusheld a long acting monoclonal antibody injection for patients who are immunosuppressed as COVID-19 prevention.  I reviewed the risks and benefits with her in detail, and in her case the benefits far outweigh the risks.  She has agreed to it.  I sent a high priority scheduling message to get her appointments adjusted appropriately.  I  also reviewed these changes with Tanya Sack, NP, Tanya Mcclure and Tanya Mcclure the nursing leaders.    Tanya Bihari, NP

## 2020-12-05 NOTE — Progress Notes (Signed)
I connected by phone with Tanya Mcclure on 12/05/2020, 9:43 AM to discuss the potential use of a new treatment, tixagevimab/cilgavimab, for pre-exposure prophylaxis for prevention of coronavirus disease 2019 (COVID-19) caused by the SARS-CoV-2 virus.  This patient is a 60 y.o. female that meets the FDA criteria for Emergency Use Authorization of tixagevimab/cilgavimab for pre-exposure prophylaxis of COVID-19 disease. Pt meets following criteria: Age >12 yr and weight > 40kg Not currently infected with SARS-CoV-2 and has no known recent exposure to an individual infected with SARS-CoV-2 AND Who has moderate to severe immune compromise due to a medical condition or receipt of immunosuppressive medications or treatments and may not mount an adequate immune response to COVID-19 vaccination or  Vaccination with any available COVID-19 vaccine, according to the approved or authorized schedule, is not recommended due to a history of severe adverse reaction (e.g., severe allergic reaction) to a COVID-19 vaccine(s) and/or COVID-19 vaccine component(s).  Patient meets the following definition of mod-severe immune compromised status: 7. Solid tumor malignancies on immunomodulatory chemotherapy or advanced AIDS   I have spoken and communicated the following to the patient or parent/caregiver regarding COVID monoclonal antibody treatment:  FDA has authorized the emergency use of tixagevimab/cilgavimab for the pre-exposure prophylaxis of COVID-19 in patients with moderate-severe immunocompromised status, who meet above EUA criteria.  The significant known and potential risks and benefits of COVID monoclonal antibody, and the extent to which such potential risks and benefits are unknown.  Information on available alternative treatments and the risks and benefits of those alternatives, including clinical trials.  The patient or parent/caregiver has the option to accept or refuse COVID monoclonal antibody  treatment.  After reviewing this information with the patient, agree to receive tixagevimab/cilgavimab.  Patient will receive this on 12/24/2020 when outside of exposure window from her grandson.  Scheduling message sent.    Scot Dock, NP, 12/05/2020, 9:43 AM

## 2020-12-06 ENCOUNTER — Inpatient Hospital Stay: Payer: Medicaid Other

## 2020-12-07 ENCOUNTER — Encounter: Payer: Self-pay | Admitting: Oncology

## 2020-12-07 ENCOUNTER — Encounter: Payer: Self-pay | Admitting: Hematology and Oncology

## 2020-12-07 ENCOUNTER — Inpatient Hospital Stay: Payer: Medicaid Other

## 2020-12-07 DIAGNOSIS — C20 Malignant neoplasm of rectum: Secondary | ICD-10-CM

## 2020-12-07 LAB — COMPREHENSIVE METABOLIC PANEL
Albumin: 3.9 (ref 3.5–5.0)
Calcium: 9.3 (ref 8.7–10.7)

## 2020-12-07 LAB — BASIC METABOLIC PANEL
BUN: 11 (ref 4–21)
CO2: 27 — AB (ref 13–22)
Chloride: 101 (ref 99–108)
Creatinine: 0.6 (ref 0.5–1.1)
Glucose: 145
Potassium: 3.6 (ref 3.4–5.3)
Sodium: 136 — AB (ref 137–147)

## 2020-12-07 LAB — HEPATIC FUNCTION PANEL
ALT: 17 (ref 7–35)
AST: 35 (ref 13–35)
Alkaline Phosphatase: 67 (ref 25–125)
Bilirubin, Total: 0.6

## 2020-12-07 LAB — CBC AND DIFFERENTIAL
HCT: 37 (ref 36–46)
Hemoglobin: 12 (ref 12.0–16.0)
Neutrophils Absolute: 0.75
Platelets: 153 (ref 150–399)
WBC: 1.7

## 2020-12-07 LAB — CBC: RBC: 4.26 (ref 3.87–5.11)

## 2020-12-07 MED FILL — Dexamethasone Sodium Phosphate Inj 100 MG/10ML: INTRAMUSCULAR | Qty: 1 | Status: AC

## 2020-12-07 MED FILL — Fosaprepitant Dimeglumine For IV Infusion 150 MG (Base Eq): INTRAVENOUS | Qty: 5 | Status: AC

## 2020-12-10 ENCOUNTER — Ambulatory Visit: Payer: Medicaid Other

## 2020-12-10 ENCOUNTER — Encounter: Payer: Self-pay | Admitting: Oncology

## 2020-12-12 ENCOUNTER — Ambulatory Visit: Payer: Medicaid Other

## 2020-12-14 ENCOUNTER — Other Ambulatory Visit: Payer: Self-pay | Admitting: Pharmacist

## 2020-12-14 ENCOUNTER — Telehealth: Payer: Self-pay

## 2020-12-14 ENCOUNTER — Other Ambulatory Visit: Payer: Medicaid Other

## 2020-12-14 ENCOUNTER — Ambulatory Visit: Payer: Medicaid Other | Admitting: Hematology and Oncology

## 2020-12-14 ENCOUNTER — Ambulatory Visit: Payer: Medicaid Other

## 2020-12-14 MED FILL — Fosaprepitant Dimeglumine For IV Infusion 150 MG (Base Eq): INTRAVENOUS | Qty: 5 | Status: AC

## 2020-12-14 MED FILL — Dexamethasone Sodium Phosphate Inj 100 MG/10ML: INTRAMUSCULAR | Qty: 1 | Status: AC

## 2020-12-14 NOTE — Telephone Encounter (Addendum)
Pt has f/u appt 12/28/2020.        From Ulice Dash, pharmacist: This pt was deferred last week due to exposure to COVID and low ANC at 0.75.   Dr. Hinton Rao wanted her tested for covid prior to resuming treatment and requested that she wear a N95 mask.  I also think we should repeat labs today, if she has already had a negative covid test, just to make sure that her [place] has improved.  Will you call her and discuss? She is scheduled 8/8 to resume FOLFOX

## 2020-12-17 ENCOUNTER — Inpatient Hospital Stay: Payer: Medicaid Other

## 2020-12-18 ENCOUNTER — Ambulatory Visit: Payer: Medicaid Other

## 2020-12-19 ENCOUNTER — Inpatient Hospital Stay: Payer: Medicaid Other

## 2020-12-21 ENCOUNTER — Other Ambulatory Visit: Payer: Medicaid Other

## 2020-12-21 ENCOUNTER — Ambulatory Visit: Payer: Medicaid Other | Admitting: Oncology

## 2020-12-21 ENCOUNTER — Ambulatory Visit: Payer: Medicaid Other

## 2020-12-21 NOTE — Progress Notes (Signed)
[facility]  [address] [place],  [place]  [id number] [phone]  Clinic Day:  12/31/2020  Referring physician: Garnetta Buddy I, NP   CHIEF COMPLAINT:  CC:   A 60 year old female with history of Stage IIIB rectal cancer here for pretreatment evaluation.  Current Treatment:   Plan for adjuvant FOLFOX chemotherapy every 2 weeks for 8 cycles   HISTORY OF PRESENT ILLNESS:  Tanya Mcclure is a 61 y.o. female with clinical stage IIIB ([id number]) rectal cancer diagnosed in December 2021.  She presented with daily rectal bleeding with bright red blood, chronic diarrhea and stool urgency.  She saw Dr. Melina Copa and underwent colonoscopy in December. This revealed a rectal mass beginning at about 2 cm from the anal verge and was noted to obstruct 50% of the circumference of the rectum.  Benign polyps were also present and removed.  She had never undergone colonoscopy prior to this procedure. Biopsy revealed invasive adenocarcinoma. EKG D revealed an ulcer, so ibuprofen was discontinued and she was placed on pantoprazole 40 mg twice daily. CT abdomen and pelvis revealed several small perirectal and presacral lymph nodes, highly concerning for metastatic involvement.  No other evidence of metastatic disease was observed.  She was seen by Dr. Lilia Pro in consultation to discuss surgical resection and recommended neoadjuvant chemoradiation.  She completed neoadjuvant chemoradiation with infusional 5 fluorouracil in February.  She developed severe hypocalcemia while on treatment requiring oral and IV replacement.  After neoadjuvant treatment she was referred to Dr. Marcello Moores in [place] for possible low anterior resection. MRI after neoadjuvant therapy revealed a 5.8 cm circumferential distal rectal tumor abutting the internal anal sphincter. Bulky residual tumor posteriorly with gross perirectal extension along the left posterior aspect, abutting/involving the left levator ani muscle.   Due to the tumor involving the pelvic floor muscles and abutting the anal sphincter, abdominoperitoneal resection was recommended.  She underwent abdominoperitoneal resection on April 2022.  Pathology revealed a residual 3 cm invasive adenocarcinoma extending into the perirectal connective tissue.  Nineteen lymph nodes were negative for metastasis. Margins were negative and no lymphovascular or perineural invasion was seen.   We recommended adjuvant FOLFOX chemotherapy for 8 cycles.  She had her 1st cycle on May 31st.  She had difficulty tolerating her 1st cycle due to severe nausea and vomiting for 3 days despite receiving Aloxi IV with her chemotherapy.   She states the Zofran 4 mg and Compazine 10 mg were ineffective. We added Emend with the 2nd cycle and told her she could use $RemoveB'8mg'wemjPHAQ$  of Zofran every 8 hours as needed.  On July 7th her ANC was down to 1.27, and she was given 1 dose of Zarxio the next day.  They then proceeded with her 4th cycle on July 12th.    INTERVAL HISTORY:  Tanya Mcclure is here for follow up prior to a 5th cycle of FOLFOX.  Her treatment had been delayed due to a recent COVID exposure. She did not develop COVID and is doing well. She denies fever, chills, nausea or vomiting. She denies issue with bowel or bladder. She denies shortness of breath, chest pain or cough. CBC and CMP are unremarkable.  REVIEW OF SYSTEMS:  Review of Systems  Constitutional: Negative.  Negative for appetite change, chills, fatigue, fever and unexpected weight change.  HENT:  Negative.    Eyes: Negative.   Respiratory: Negative.  Negative for chest tightness, cough, hemoptysis, shortness of breath and wheezing.   Cardiovascular: Negative.  Negative  for chest pain, leg swelling and palpitations.  Gastrointestinal: Negative.  Negative for abdominal distention, abdominal pain, blood in stool, constipation, diarrhea, nausea and vomiting.  Endocrine: Negative.   Genitourinary: Negative.  Negative for difficulty  urinating, dysuria, frequency and hematuria.   Musculoskeletal: Negative.  Negative for arthralgias, back pain, flank pain, gait problem and myalgias.  Skin: Negative.   Neurological: Negative.  Negative for dizziness, extremity weakness, gait problem, headaches, light-headedness, numbness, seizures and speech difficulty.  Hematological: Negative.   Psychiatric/Behavioral: Negative.  Negative for depression and sleep disturbance. The patient is not nervous/anxious.     VITALS:  Blood pressure (!) 156/72, pulse 72, temperature 97.7 F (36.5 C), temperature source Oral, resp. rate 18, height 5' (1.524 m), weight 178 lb 9.6 oz (81 kg), SpO2 98 %.  Wt Readings from Last 3 Encounters:  12/31/20 181 lb (82.1 kg)  12/28/20 178 lb 9.6 oz (81 kg)  11/30/20 171 lb 14.4 oz (78 kg)    Body mass index is 34.88 kg/m.  Performance status (ECOG): 0 - Asymptomatic  PHYSICAL EXAM:  Physical Exam Constitutional:      General: She is not in acute distress.    Appearance: Normal appearance. She is normal weight.  HENT:     Head: Normocephalic and atraumatic.  Eyes:     General: No scleral icterus.    Extraocular Movements: Extraocular movements intact.     Conjunctiva/sclera: Conjunctivae normal.     Pupils: Pupils are equal, round, and reactive to light.  Cardiovascular:     Rate and Rhythm: Normal rate and regular rhythm.     Pulses: Normal pulses.     Heart sounds: Normal heart sounds. No murmur heard.   No friction rub. No gallop.  Pulmonary:     Effort: Pulmonary effort is normal. No respiratory distress.     Breath sounds: Normal breath sounds.  Abdominal:     General: Bowel sounds are normal. There is no distension.     Palpations: Abdomen is soft. There is no hepatomegaly, splenomegaly or mass.     Tenderness: There is no abdominal tenderness.  Musculoskeletal:        General: Normal range of motion.     Cervical back: Normal range of motion and neck supple.     Right lower leg: No  edema.     Left lower leg: No edema.  Lymphadenopathy:     Cervical: No cervical adenopathy.  Skin:    General: Skin is warm and dry.  Neurological:     General: No focal deficit present.     Mental Status: She is alert and oriented to person, place, and time. Mental status is at baseline.  Psychiatric:        Mood and Affect: Mood normal.        Behavior: Behavior normal.        Thought Content: Thought content normal.        Judgment: Judgment normal.   LABS:   CBC Latest Ref Rng & Units 12/28/2020 12/07/2020 11/30/2020  WBC - 3.3 1.7 2.8  Hemoglobin 12.0 - 16.0 12.8 12.0 11.5(A)  Hematocrit 36 - 46 39 37 35(A)  Platelets 150 - 399 226 153 123(A)   CMP Latest Ref Rng & Units 12/28/2020 12/07/2020 11/30/2020  Glucose 70 - 99 mg/dL - - -  BUN 4 - $R'21 15 11 12  'CT$ Creatinine 0.5 - 1.1 0.7 0.6 0.6  Sodium 137 - 147 138 136(A) 136(A)  Potassium 3.4 - 5.3 4.1  3.6 3.9  Chloride 99 - 108 100 101 102  CO2 13 - 22 28(A) 27(A) 28(A)  Calcium 8.7 - 10.7 9.4 9.3 9.0  Total Protein 6.3 - 8.2 g/dL - - -  Alkaline Phos 25 - 125 61 67 64  AST 13 - 35 30 35 32  ALT 7 - 35 $Re'12 17 15     'veo$ Lab Results  Component Value Date   CEA1 1.1 10/01/2020   /  CEA  Date Value Ref Range Status  10/01/2020 1.1 0.0 - 4.7 ng/mL Final    Comment:    (NOTE)                             Nonsmokers          <3.9                             Smokers             <5.6 [employer] Electrochemiluminescence Immunoassay (ECLIA) Values obtained with different assay methods or kits cannot be used interchangeably.  Results cannot be interpreted as absolute evidence of the presence or absence of malignant disease. Performed At: [facility] [place], [place] [id number] Rush Farmer MD YN:[id number]     STUDIES:  No results found.    HISTORY:   Allergies:  Allergies  Allergen Reactions   Penicillins Rash    Current Medications: Current Outpatient Medications  Medication Sig  Dispense Refill   Accu-Chek Softclix Lancets lancets USE TO CHECK BLOOD GLUCOSE TWICE DAILY.     albuterol (PROVENTIL) (2.5 MG/3ML) 0.083% nebulizer solution Take 2.5 mg by nebulization every 6 (six) hours as needed for wheezing or shortness of breath. (Patient not taking: Reported on 10/16/2020)     albuterol (VENTOLIN HFA) 108 (90 Base) MCG/ACT inhaler Inhale 2 puffs into the lungs every 6 (six) hours as needed for shortness of breath or wheezing.     aspirin 81 MG EC tablet Take 81 mg by mouth daily. (Patient not taking: No sig reported)     atorvastatin (LIPITOR) 40 MG tablet Take 40 mg by mouth daily.     cetirizine (ZYRTEC) 10 MG tablet Take 10 mg by mouth daily as needed for allergies.     dicyclomine (BENTYL) 10 MG capsule Take 1 capsule (10 mg total) by mouth 4 (four) times daily -  before meals and at bedtime. (Patient not taking: Reported on 10/16/2020) 90 capsule 0   diphenoxylate-atropine (LOMOTIL) 2.5-0.025 MG tablet Take 2 tablets by mouth 4 (four) times daily as needed for diarrhea or loose stools. 60 tablet 0   furosemide (LASIX) 20 MG tablet Take 20 mg by mouth daily.     gabapentin (NEURONTIN) 300 MG capsule Take 300 mg by mouth 2 (two) times daily.     glucose blood (ACCU-CHEK AVIVA PLUS) test strip Check blood glucose twice daily and as needed. Dx E 11.65     HYDROcodone-acetaminophen (NORCO) 7.5-325 MG tablet Take 1 tablet by mouth every 6 (six) hours as needed for moderate pain. 60 tablet 0   Lancets Misc. (ACCU-CHEK FASTCLIX LANCET) KIT Check blood glucose twice daily. Dx E 11.65     levothyroxine (SYNTHROID) 100 MCG tablet Take 100 mcg by mouth daily before breakfast.     metFORMIN (GLUCOPHAGE-XR) 500 MG 24 hr tablet Take 500 mg by mouth 2 (two) times daily with a  meal.     montelukast (SINGULAIR) 10 MG tablet Take 10 mg by mouth daily.     ondansetron (ZOFRAN) 8 MG tablet Take 1 tablet (8 mg total) by mouth every 8 (eight) hours as needed for nausea or vomiting. 20 tablet 1    pantoprazole (PROTONIX) 40 MG tablet Take 40 mg by mouth 2 (two) times daily.     potassium chloride SA (KLOR-CON) 20 MEQ tablet Take 1 tablet (20 mEq total) by mouth 2 (two) times daily. 60 tablet 3   prochlorperazine (COMPAZINE) 10 MG tablet Take 1 tablet (10 mg total) by mouth every 6 (six) hours as needed for nausea or vomiting. 90 tablet 3   Current Facility-Administered Medications  Medication Dose Route Frequency Provider Last Rate Last Admin   triamcinolone acetonide (KENALOG) 10 MG/ML injection 10 mg  10 mg Other Once Landis Martins, DPM       triamcinolone acetonide (KENALOG-40) injection 20 mg  20 mg Other Once Landis Martins, DPM       Facility-Administered Medications Ordered in Other Visits  Medication Dose Route Frequency Provider Last Rate Last Admin   dexamethasone (DECADRON) 10 mg in sodium chloride 0.9 % 50 mL IVPB  10 mg Intravenous Once Derwood Kaplan, MD       fluorouracil (ADRUCIL) 3,500 mg in sodium chloride 0.9 % 80 mL chemo infusion  1,920 mg/m2 (Treatment Plan Recorded) Intravenous 1 day or 1 dose Derwood Kaplan, MD       fluorouracil (ADRUCIL) chemo injection 600 mg  320 mg/m2 (Treatment Plan Recorded) Intravenous Once Derwood Kaplan, MD       fosaprepitant (EMEND) 150 mg in sodium chloride 0.9 % 145 mL IVPB  150 mg Intravenous Once Derwood Kaplan, MD 450 mL/hr at 12/31/20 0921 150 mg at 12/31/20 4356   leucovorin 586 mg in dextrose 5 % 250 mL infusion  320 mg/m2 (Treatment Plan Recorded) Intravenous Once Derwood Kaplan, MD       oxaliplatin (ELOXATIN) 125 mg in dextrose 5 % 500 mL chemo infusion  68 mg/m2 (Treatment Plan Recorded) Intravenous Once Derwood Kaplan, MD       palonosetron (ALOXI) injection 0.25 mg  0.25 mg Intravenous Once Derwood Kaplan, MD         ASSESSMENT & PLAN:   Assessment: 1.  Clinical stage IIIB ([id number]) rectal cancer, based on CT reveal any several small perirectal and presacral lymph nodes,  which were highly concerning for metastatic involvement. She underwent abdominoperitoneal resection in April and lymph nodes were negative.   She is now receiving adjuvant FOLFOX chemotherapy with plans for 8 cycles.     Plan:   She will proceed with a 5th cycle of FOLFOX next week. She will return to clinic in 2 weeks for repeat evaluation prior to cycle 6.   She verbalizes understanding of and agreement to the plans discussed today. She knows to call the office should any new questions or concerns arise.   Dayton Scrape, FNP- [facility]

## 2020-12-24 ENCOUNTER — Ambulatory Visit: Payer: Medicaid Other

## 2020-12-24 ENCOUNTER — Other Ambulatory Visit: Payer: Self-pay | Admitting: Oncology

## 2020-12-25 ENCOUNTER — Ambulatory Visit: Payer: Medicaid Other

## 2020-12-26 ENCOUNTER — Encounter: Payer: Self-pay | Admitting: Oncology

## 2020-12-26 ENCOUNTER — Ambulatory Visit: Payer: Medicaid Other

## 2020-12-28 ENCOUNTER — Encounter: Payer: Self-pay | Admitting: Hematology and Oncology

## 2020-12-28 ENCOUNTER — Ambulatory Visit: Payer: Medicaid Other

## 2020-12-28 ENCOUNTER — Inpatient Hospital Stay: Payer: Medicaid Other

## 2020-12-28 ENCOUNTER — Other Ambulatory Visit: Payer: Self-pay

## 2020-12-28 ENCOUNTER — Inpatient Hospital Stay: Payer: Medicaid Other | Attending: Oncology | Admitting: Hematology and Oncology

## 2020-12-28 VITALS — BP 156/72 | HR 72 | Temp 97.7°F | Resp 18 | Ht 60.0 in | Wt 178.6 lb

## 2020-12-28 DIAGNOSIS — C20 Malignant neoplasm of rectum: Secondary | ICD-10-CM | POA: Diagnosis not present

## 2020-12-28 DIAGNOSIS — Z79899 Other long term (current) drug therapy: Secondary | ICD-10-CM | POA: Insufficient documentation

## 2020-12-28 DIAGNOSIS — Z5111 Encounter for antineoplastic chemotherapy: Secondary | ICD-10-CM | POA: Insufficient documentation

## 2020-12-28 DIAGNOSIS — Z7982 Long term (current) use of aspirin: Secondary | ICD-10-CM | POA: Insufficient documentation

## 2020-12-28 LAB — BASIC METABOLIC PANEL
BUN: 15 (ref 4–21)
CO2: 28 — AB (ref 13–22)
Chloride: 100 (ref 99–108)
Creatinine: 0.7 (ref 0.5–1.1)
Glucose: 193
Potassium: 4.1 (ref 3.4–5.3)
Sodium: 138 (ref 137–147)

## 2020-12-28 LAB — COMPREHENSIVE METABOLIC PANEL
Albumin: 4.2 (ref 3.5–5.0)
Calcium: 9.4 (ref 8.7–10.7)

## 2020-12-28 LAB — CBC AND DIFFERENTIAL
HCT: 39 (ref 36–46)
Hemoglobin: 12.8 (ref 12.0–16.0)
Neutrophils Absolute: 2.21
Platelets: 226 (ref 150–399)
WBC: 3.3

## 2020-12-28 LAB — HEPATIC FUNCTION PANEL
ALT: 12 (ref 7–35)
AST: 30 (ref 13–35)
Alkaline Phosphatase: 61 (ref 25–125)
Bilirubin, Total: 0.5

## 2020-12-28 LAB — CBC: RBC: 4.48 (ref 3.87–5.11)

## 2020-12-28 MED FILL — Dexamethasone Sodium Phosphate Inj 100 MG/10ML: INTRAMUSCULAR | Qty: 1 | Status: AC

## 2020-12-28 MED FILL — Fosaprepitant Dimeglumine For IV Infusion 150 MG (Base Eq): INTRAVENOUS | Qty: 5 | Status: AC

## 2020-12-31 ENCOUNTER — Other Ambulatory Visit: Payer: Self-pay | Admitting: Pharmacist

## 2020-12-31 ENCOUNTER — Inpatient Hospital Stay: Payer: Medicaid Other

## 2020-12-31 ENCOUNTER — Other Ambulatory Visit: Payer: Self-pay

## 2020-12-31 ENCOUNTER — Encounter: Payer: Self-pay | Admitting: Oncology

## 2020-12-31 VITALS — BP 146/76 | HR 88 | Temp 98.5°F | Resp 18 | Ht 60.0 in | Wt 181.0 lb

## 2020-12-31 DIAGNOSIS — Z5111 Encounter for antineoplastic chemotherapy: Secondary | ICD-10-CM | POA: Diagnosis not present

## 2020-12-31 DIAGNOSIS — Z7982 Long term (current) use of aspirin: Secondary | ICD-10-CM | POA: Diagnosis not present

## 2020-12-31 DIAGNOSIS — Z79899 Other long term (current) drug therapy: Secondary | ICD-10-CM | POA: Diagnosis not present

## 2020-12-31 DIAGNOSIS — C20 Malignant neoplasm of rectum: Secondary | ICD-10-CM

## 2020-12-31 MED ORDER — FLUOROURACIL CHEMO INJECTION 2.5 GM/50ML
320.0000 mg/m2 | Freq: Once | INTRAVENOUS | Status: AC
  Administered 2020-12-31: 600 mg via INTRAVENOUS
  Filled 2020-12-31: qty 12

## 2020-12-31 MED ORDER — SODIUM CHLORIDE 0.9 % IV SOLN
150.0000 mg | Freq: Once | INTRAVENOUS | Status: AC
  Administered 2020-12-31: 150 mg via INTRAVENOUS
  Filled 2020-12-31: qty 5

## 2020-12-31 MED ORDER — SODIUM CHLORIDE 0.9 % IV SOLN
1920.0000 mg/m2 | INTRAVENOUS | Status: DC
  Administered 2020-12-31: 3500 mg via INTRAVENOUS
  Filled 2020-12-31: qty 70

## 2020-12-31 MED ORDER — DEXTROSE 5 % IV SOLN: Freq: Once | INTRAVENOUS | Status: AC

## 2020-12-31 MED ORDER — SODIUM CHLORIDE 0.9 % IV SOLN
10.0000 mg | Freq: Once | INTRAVENOUS | Status: AC
  Administered 2020-12-31: 10 mg via INTRAVENOUS
  Filled 2020-12-31: qty 1

## 2020-12-31 MED ORDER — PALONOSETRON HCL INJECTION 0.25 MG/5ML
0.2500 mg | Freq: Once | INTRAVENOUS | Status: AC
  Administered 2020-12-31: 0.25 mg via INTRAVENOUS
  Filled 2020-12-31: qty 5

## 2020-12-31 MED ORDER — OXALIPLATIN CHEMO INJECTION 100 MG/20ML
68.0000 mg/m2 | Freq: Once | INTRAVENOUS | Status: AC
  Administered 2020-12-31: 125 mg via INTRAVENOUS
  Filled 2020-12-31: qty 20

## 2020-12-31 MED ORDER — LEUCOVORIN CALCIUM INJECTION 350 MG
320.0000 mg/m2 | Freq: Once | INTRAVENOUS | Status: AC
  Administered 2020-12-31: 586 mg via INTRAVENOUS
  Filled 2020-12-31: qty 25

## 2020-12-31 NOTE — Patient Instructions (Signed)
Leucovorin injection What is this medication? LEUCOVORIN (loo koe VOR in) is used to prevent or treat the harmful effects of some medicines. This medicine is used to treat anemia caused by a low amount of folic acid in the body. It is also used with 5-fluorouracil (5-FU) to treatcolon cancer. This medicine may be used for other purposes; ask your health care provider orpharmacist if you have questions. What should I tell my care team before I take this medication? They need to know if you have any of these conditions: anemia from low levels of vitamin B-12 in the blood an unusual or allergic reaction to leucovorin, folic acid, other medicines, foods, dyes, or preservatives pregnant or trying to get pregnant breast-feeding How should I use this medication? This medicine is for injection into a muscle or into a vein. It is given by ahealth care professional in a hospital or clinic setting. Talk to your pediatrician regarding the use of this medicine in children.Special care may be needed. Overdosage: If you think you have taken too much of this medicine contact apoison control center or emergency room at once. NOTE: This medicine is only for you. Do not share this medicine with others. What if I miss a dose? This does not apply. What may interact with this medication? capecitabine fluorouracil phenobarbital phenytoin primidone trimethoprim-sulfamethoxazole This list may not describe all possible interactions. Give your health care provider a list of all the medicines, herbs, non-prescription drugs, or dietary supplements you use. Also tell them if you smoke, drink alcohol, or use illegaldrugs. Some items may interact with your medicine. What should I watch for while using this medication? Your condition will be monitored carefully while you are receiving thismedicine. This medicine may increase the side effects of 5-fluorouracil, 5-FU. Tell your doctor or health care professional if you have  diarrhea or mouth sores that donot get better or that get worse. What side effects may I notice from receiving this medication? Side effects that you should report to your doctor or health care professionalas soon as possible: allergic reactions like skin rash, itching or hives, swelling of the face, lips, or tongue breathing problems fever, infection mouth sores unusual bleeding or bruising unusually weak or tired Side effects that usually do not require medical attention (report to yourdoctor or health care professional if they continue or are bothersome): constipation or diarrhea loss of appetite nausea, vomiting This list may not describe all possible side effects. Call your doctor for medical advice about side effects. You may report side effects to FDA at1-800-FDA-1088. Where should I keep my medication? This drug is given in a hospital or clinic and will not be stored at home. NOTE: This sheet is a summary. It may not cover all possible information. If you have questions about this medicine, talk to your doctor, pharmacist, orhealth care provider.  2022 [employer]/Gold Standard (2007-11-02 16:50:29) Fluorouracil, 5-FU injection What is this medication? FLUOROURACIL, 5-FU (flure oh YOOR a sil) is a chemotherapy drug. It slows the growth of cancer cells. This medicine is used to treat many types of cancer like breast cancer, colon or rectal cancer, pancreatic cancer, and stomachcancer. This medicine may be used for other purposes; ask your health care provider orpharmacist if you have questions. COMMON BRAND NAME(S): Adrucil What should I tell my care team before I take this medication? They need to know if you have any of these conditions: blood disorders dihydropyrimidine dehydrogenase (DPD) deficiency infection (especially a virus infection such as chickenpox, cold sores,  or herpes) kidney disease liver disease malnourished, poor nutrition recent or ongoing radiation therapy an  unusual or allergic reaction to fluorouracil, other chemotherapy, other medicines, foods, dyes, or preservatives pregnant or trying to get pregnant breast-feeding How should I use this medication? This drug is given as an infusion or injection into a vein. It is administeredin a hospital or clinic by a specially trained health care professional. Talk to your pediatrician regarding the use of this medicine in children.Special care may be needed. Overdosage: If you think you have taken too much of this medicine contact apoison control center or emergency room at once. NOTE: This medicine is only for you. Do not share this medicine with others. What if I miss a dose? It is important not to miss your dose. Call your doctor or health careprofessional if you are unable to keep an appointment. What may interact with this medication? Do not take this medicine with any of the following medications: live virus vaccines This medicine may also interact with the following medications: medicines that treat or prevent blood clots like warfarin, enoxaparin, and dalteparin This list may not describe all possible interactions. Give your health care provider a list of all the medicines, herbs, non-prescription drugs, or dietary supplements you use. Also tell them if you smoke, drink alcohol, or use illegaldrugs. Some items may interact with your medicine. What should I watch for while using this medication? Visit your doctor for checks on your progress. This drug may make you feel generally unwell. This is not uncommon, as chemotherapy can affect healthy cells as well as cancer cells. Report any side effects. Continue your course oftreatment even though you feel ill unless your doctor tells you to stop. In some cases, you may be given additional medicines to help with side effects.Follow all directions for their use. Call your doctor or health care professional for advice if you get a fever, chills or sore throat,  or other symptoms of a cold or flu. Do not treat yourself. This drug decreases your body's ability to fight infections. Try toavoid being around people who are sick. This medicine may increase your risk to bruise or bleed. Call your doctor orhealth care professional if you notice any unusual bleeding. Be careful brushing and flossing your teeth or using a toothpick because you may get an infection or bleed more easily. If you have any dental work done,tell your dentist you are receiving this medicine. Avoid taking products that contain aspirin, acetaminophen, ibuprofen, naproxen, or ketoprofen unless instructed by your doctor. These medicines may hide afever. Do not become pregnant while taking this medicine. Women should inform their doctor if they wish to become pregnant or think they might be pregnant. There is a potential for serious side effects to an unborn child. Talk to your health care professional or pharmacist for more information. Do not breast-feed aninfant while taking this medicine. Men should inform their doctor if they wish to father a child. This medicinemay lower sperm counts. Do not treat diarrhea with over the counter products. Contact your doctor ifyou have diarrhea that lasts more than 2 days or if it is severe and watery. This medicine can make you more sensitive to the sun. Keep out of the sun. If you cannot avoid being in the sun, wear protective clothing and use sunscreen.Do not use sun lamps or tanning beds/booths. What side effects may I notice from receiving this medication? Side effects that you should report to your doctor or health care professionalas soon  as possible: allergic reactions like skin rash, itching or hives, swelling of the face, lips, or tongue low blood counts - this medicine may decrease the number of white blood cells, red blood cells and platelets. You may be at increased risk for infections and bleeding. signs of infection - fever or chills, cough, sore  throat, pain or difficulty passing urine signs of decreased platelets or bleeding - bruising, pinpoint red spots on the skin, black, tarry stools, blood in the urine signs of decreased red blood cells - unusually weak or tired, fainting spells, lightheadedness breathing problems changes in vision chest pain mouth sores nausea and vomiting pain, swelling, redness at site where injected pain, tingling, numbness in the hands or feet redness, swelling, or sores on hands or feet stomach pain unusual bleeding Side effects that usually do not require medical attention (report to yourdoctor or health care professional if they continue or are bothersome): changes in finger or toe nails diarrhea dry or itchy skin hair loss headache loss of appetite sensitivity of eyes to the light stomach upset unusually teary eyes This list may not describe all possible side effects. Call your doctor for medical advice about side effects. You may report side effects to FDA at1-800-FDA-1088. Where should I keep my medication? This drug is given in a hospital or clinic and will not be stored at home. NOTE: This sheet is a summary. It may not cover all possible information. If you have questions about this medicine, talk to your doctor, pharmacist, orhealth care provider.  2022 [employer]/Gold Standard (2019-03-29 15:00:03) Oxaliplatin Injection What is this medication? OXALIPLATIN (ox AL i PLA tin) is a chemotherapy drug. It targets fast dividing cells, like cancer cells, and causes these cells to die. This medicine is usedto treat cancers of the colon and rectum, and many other cancers. This medicine may be used for other purposes; ask your health care provider orpharmacist if you have questions. COMMON BRAND NAME(S): Eloxatin What should I tell my care team before I take this medication? They need to know if you have any of these conditions: heart disease history of irregular heartbeat liver disease low  blood counts, like white cells, platelets, or red blood cells lung or breathing disease, like asthma take medicines that treat or prevent blood clots tingling of the fingers or toes, or other nerve disorder an unusual or allergic reaction to oxaliplatin, other chemotherapy, other medicines, foods, dyes, or preservatives pregnant or trying to get pregnant breast-feeding How should I use this medication? This drug is given as an infusion into a vein. It is administered in a hospitalor clinic by a specially trained health care professional. Talk to your pediatrician regarding the use of this medicine in children.Special care may be needed. Overdosage: If you think you have taken too much of this medicine contact apoison control center or emergency room at once. NOTE: This medicine is only for you. Do not share this medicine with others. What if I miss a dose? It is important not to miss a dose. Call your doctor or health careprofessional if you are unable to keep an appointment. What may interact with this medication? Do not take this medicine with any of the following medications: cisapride dronedarone pimozide thioridazine This medicine may also interact with the following medications: aspirin and aspirin-like medicines certain medicines that treat or prevent blood clots like warfarin, apixaban, dabigatran, and rivaroxaban cisplatin cyclosporine diuretics medicines for infection like acyclovir, adefovir, amphotericin B, bacitracin, cidofovir, foscarnet, ganciclovir, gentamicin, pentamidine, vancomycin NSAIDs,  medicines for pain and inflammation, like ibuprofen or naproxen other medicines that prolong the QT interval (an abnormal heart rhythm) pamidronate zoledronic acid This list may not describe all possible interactions. Give your health care provider a list of all the medicines, herbs, non-prescription drugs, or dietary supplements you use. Also tell them if you smoke, drink alcohol,  or use illegaldrugs. Some items may interact with your medicine. What should I watch for while using this medication? Your condition will be monitored carefully while you are receiving thismedicine. You may need blood work done while you are taking this medicine. This medicine may make you feel generally unwell. This is not uncommon as chemotherapy can affect healthy cells as well as cancer cells. Report any side effects. Continue your course of treatment even though you feel ill unless yourhealthcare professional tells you to stop. This medicine can make you more sensitive to cold. Do not drink cold drinks or use ice. Cover exposed skin before coming in contact with cold temperatures or cold objects. When out in cold weather wear warm clothing and cover your mouth and nose to warm the air that goes into your lungs. Tell your doctor if you getsensitive to the cold. Do not become pregnant while taking this medicine or for 9 months after stopping it. Women should inform their health care professional if they wish to become pregnant or think they might be pregnant. Men should not father a child while taking this medicine and for 6 months after stopping it. There is potential for serious side effects to an unborn child. Talk to your health careprofessional for more information. Do not breast-feed a child while taking this medicine or for 3 months afterstopping it. This medicine has caused ovarian failure in some women. This medicine may make it more difficult to get pregnant. Talk to your health care professional if [place] concerned about your fertility. This medicine has caused decreased sperm counts in some men. This may make it more difficult to father a child. Talk to your health care professional if [place] concerned about your fertility. This medicine may increase your risk of getting an infection. Call your health care professional for advice if you get a fever, chills, or sore throat, or other symptoms  of a cold or flu. Do not treat yourself. Try to avoid beingaround people who are sick. Avoid taking medicines that contain aspirin, acetaminophen, ibuprofen, naproxen, or ketoprofen unless instructed by your health care professional.These medicines may hide a fever. Be careful brushing or flossing your teeth or using a toothpick because you may get an infection or bleed more easily. If you have any dental work done, Primary school teacher you are receiving this medicine. What side effects may I notice from receiving this medication? Side effects that you should report to your doctor or health care professionalas soon as possible: allergic reactions like skin rash, itching or hives, swelling of the face, lips, or tongue breathing problems cough low blood counts - this medicine may decrease the number of white blood cells, red blood cells, and platelets. You may be at increased risk for infections and bleeding nausea, vomiting pain, redness, or irritation at site where injected pain, tingling, numbness in the hands or feet signs and symptoms of bleeding such as bloody or black, tarry stools; red or dark brown urine; spitting up blood or brown material that looks like coffee grounds; red spots on the skin; unusual bruising or bleeding from the eyes, gums, or nose signs and symptoms of a dangerous  change in heartbeat or heart rhythm like chest pain; dizziness; fast, irregular heartbeat; palpitations; feeling faint or lightheaded; falls signs and symptoms of infection like fever; chills; cough; sore throat; pain or trouble passing urine signs and symptoms of liver injury like dark yellow or brown urine; general ill feeling or flu-like symptoms; light-colored stools; loss of appetite; nausea; right upper belly pain; unusually weak or tired; yellowing of the eyes or skin signs and symptoms of low red blood cells or anemia such as unusually weak or tired; feeling faint or lightheaded; falls signs and symptoms of  muscle injury like dark urine; trouble passing urine or change in the amount of urine; unusually weak or tired; muscle pain; back pain Side effects that usually do not require medical attention (report to yourdoctor or health care professional if they continue or are bothersome): changes in taste diarrhea gas hair loss loss of appetite mouth sores This list may not describe all possible side effects. Call your doctor for medical advice about side effects. You may report side effects to FDA at1-800-FDA-1088. Where should I keep my medication? This drug is given in a hospital or clinic and will not be stored at home. NOTE: This sheet is a summary. It may not cover all possible information. If you have questions about this medicine, talk to your doctor, pharmacist, orhealth care provider.  2022 [employer] Standard (2018-09-15 12:20:35)

## 2021-01-01 ENCOUNTER — Encounter: Payer: Self-pay | Admitting: Oncology

## 2021-01-02 ENCOUNTER — Inpatient Hospital Stay: Payer: Medicaid Other

## 2021-01-02 ENCOUNTER — Other Ambulatory Visit: Payer: Self-pay

## 2021-01-02 VITALS — BP 120/69 | HR 87 | Temp 98.4°F | Resp 18 | Ht 60.0 in | Wt 182.0 lb

## 2021-01-02 DIAGNOSIS — C20 Malignant neoplasm of rectum: Secondary | ICD-10-CM

## 2021-01-02 DIAGNOSIS — Z5111 Encounter for antineoplastic chemotherapy: Secondary | ICD-10-CM | POA: Diagnosis not present

## 2021-01-02 MED ORDER — HEPARIN SOD (PORK) LOCK FLUSH 100 UNIT/ML IV SOLN
500.0000 [IU] | Freq: Once | INTRAVENOUS | Status: AC | PRN
  Administered 2021-01-02: 500 [IU]

## 2021-01-02 MED ORDER — SODIUM CHLORIDE 0.9% FLUSH
10.0000 mL | INTRAVENOUS | Status: DC | PRN
  Administered 2021-01-02: 10 mL

## 2021-01-02 NOTE — Progress Notes (Signed)
Discharged home, stable  

## 2021-01-02 NOTE — Progress Notes (Signed)
[facility]  [address] [place],  Tanya Mcclure  [address] [phone]  Clinic Day:  01/10/2021  Referring physician: Garnetta Buddy I, NP  This document serves as a record of services personally performed by Hosie Poisson, MD. It was created on their behalf by Curry,Lauren E, a trained medical scribe. The creation of this record is based on the scribe's personal observations and the provider's statements to them.  CHIEF COMPLAINT:  CC:   Stage IIIB rectal cancer here for pretreatment evaluation.  Current Treatment:   Adjuvant FOLFOX chemotherapy every 2 weeks for 8 cycles  HISTORY OF PRESENT ILLNESS:  Tanya Mcclure is a 60 y.o. female with clinical stage IIIB ([id number]) rectal cancer diagnosed in December 2021.  She presented with daily rectal bleeding with bright red blood, chronic diarrhea and stool urgency.  She saw Dr. Melina Copa and underwent colonoscopy in December. This revealed a rectal mass beginning at about 2 cm from the anal verge and was noted to obstruct 50% of the circumference of the rectum.  Benign polyps were also present and removed.  She had never undergone colonoscopy prior to this procedure. Biopsy revealed invasive adenocarcinoma. EKG D revealed an ulcer, so ibuprofen was discontinued and she was placed on pantoprazole 40 mg twice daily. CT abdomen and pelvis revealed several small perirectal and presacral lymph nodes, highly concerning for metastatic involvement.  No other evidence of metastatic disease was observed.  She was seen by Dr. Lilia Pro in consultation to discuss surgical resection and recommended neoadjuvant chemoradiation.  She completed neoadjuvant chemoradiation with infusional 5 fluorouracil in February.  She developed severe hypocalcemia while on treatment requiring oral and IV replacement.  After neoadjuvant treatment she was referred to Dr. Marcello Moores in [place] for possible low anterior resection. MRI after neoadjuvant therapy  revealed a 5.8 cm circumferential distal rectal tumor abutting the internal anal sphincter. Bulky residual tumor posteriorly with gross perirectal extension along the left posterior aspect, abutting/involving the left levator ani muscle.  Due to the tumor involving the pelvic floor muscles and abutting the anal sphincter, abdominoperitoneal resection was recommended.  She underwent abdominoperitoneal resection on April 2022.  Pathology revealed a residual 3 cm invasive adenocarcinoma extending into the perirectal connective tissue.  Nineteen lymph nodes were negative for metastasis. Margins were negative and no lymphovascular or perineural invasion was seen.   We recommended adjuvant FOLFOX chemotherapy for 8 cycles.  She had her 1st cycle on May 31st.  She had difficulty tolerating her 1st cycle due to severe nausea and vomiting for 3 days despite receiving Aloxi IV with her chemotherapy.   On July 7th her ANC was down to 1.27, and she was given 1 dose of Zarxio the next day.    INTERVAL HISTORY:  Tanya Mcclure is here for follow up prior to a 6th cycle of FOLFOX.  There was a brief delay due to exposure to her grandson with COVID. She states that she needs a new glucometer and so I filled out a prescription for her today.  Otherwise, she is doing well and denies other complaints.  White count is stable to mildly improved at 3.5 with an [place] of 2100, hemoglobin has decreased from 12.8 to 11.8, and platelet count is normal.  Chemistries are unremarkable except for   Her  appetite is good, and she has gained 2 pounds since her last visit.  She denies fever, chills or other signs of infection.  She denies nausea, vomiting, bowel issues, or abdominal pain.  She denies sore throat, cough, dyspnea, or chest pain.  REVIEW OF SYSTEMS:  Review of Systems  Constitutional: Negative.  Negative for appetite change, chills, fatigue, fever and unexpected weight change.  HENT:  Negative.    Eyes: Negative.   Respiratory:  Negative.  Negative for chest tightness, cough, hemoptysis, shortness of breath and wheezing.   Cardiovascular: Negative.  Negative for chest pain, leg swelling and palpitations.  Gastrointestinal: Negative.  Negative for abdominal distention, abdominal pain, blood in stool, constipation, diarrhea, nausea and vomiting.  Endocrine: Negative.   Genitourinary: Negative.  Negative for difficulty urinating, dysuria, frequency and hematuria.   Musculoskeletal: Negative.  Negative for arthralgias, back pain, flank pain, gait problem and myalgias.  Skin: Negative.   Neurological: Negative.  Negative for dizziness, extremity weakness, gait problem, headaches, light-headedness, numbness, seizures and speech difficulty.  Hematological: Negative.   Psychiatric/Behavioral: Negative.  Negative for depression and sleep disturbance. The patient is not nervous/anxious.     VITALS:  Blood pressure (!) 181/76, pulse 73, temperature 98.3 F (36.8 C), temperature source Oral, resp. rate 20, height 5' (1.524 m), weight 178 lb 14.4 oz (81.1 kg), SpO2 97 %.  Wt Readings from Last 3 Encounters:  01/10/21 178 lb 14.4 oz (81.1 kg)  01/02/21 182 lb (82.6 kg)  12/31/20 181 lb (82.1 kg)    Body mass index is 34.94 kg/m.  Performance status (ECOG): 0 - Asymptomatic  PHYSICAL EXAM:  Physical Exam Constitutional:      General: She is not in acute distress.    Appearance: Normal appearance. She is normal weight.  HENT:     Head: Normocephalic and atraumatic.  Eyes:     General: No scleral icterus.    Extraocular Movements: Extraocular movements intact.     Conjunctiva/sclera: Conjunctivae normal.     Pupils: Pupils are equal, round, and reactive to light.  Cardiovascular:     Rate and Rhythm: Normal rate and regular rhythm.     Pulses: Normal pulses.     Heart sounds: Normal heart sounds. No murmur heard.   No friction rub. No gallop.  Pulmonary:     Effort: Pulmonary effort is normal. No respiratory  distress.     Breath sounds: Normal breath sounds.  Abdominal:     General: Bowel sounds are normal. There is no distension.     Palpations: Abdomen is soft. There is no hepatomegaly, splenomegaly or mass.     Tenderness: There is no abdominal tenderness.     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     Cervical back: Normal range of motion and neck supple.     Right lower leg: No edema.     Left lower leg: No edema.  Lymphadenopathy:     Cervical: No cervical adenopathy.  Skin:    General: Skin is warm and dry.  Neurological:     General: No focal deficit present.     Mental Status: She is alert and oriented to person, place, and time. Mental status is at baseline.  Psychiatric:        Mood and Affect: Mood normal.        Behavior: Behavior normal.        Thought Content: Thought content normal.        Judgment: Judgment normal.   LABS:   CBC Latest Ref Rng & Units 01/10/2021 12/28/2020 12/07/2020  WBC - 3.5 3.3  1.7  Hemoglobin 12.0 - 16.0 11.8(A) 12.8 12.0  Hematocrit 36 - 46 36 39 37  Platelets 150 - 399 202 226 153   CMP Latest Ref Rng & Units 01/10/2021 12/28/2020 12/07/2020  Glucose 70 - 99 mg/dL - - -  BUN 4 - $R'21 12 15 11  'Em$ Creatinine 0.5 - 1.1 0.6 0.7 0.6  Sodium 137 - 147 136(A) 138 136(A)  Potassium 3.4 - 5.3 3.3(A) 4.1 3.6  Chloride 99 - 108 102 100 101  CO2 13 - 22 27(A) 28(A) 27(A)  Calcium 8.7 - 10.7 9.1 9.4 9.3  Total Protein 6.3 - 8.2 g/dL - - -  Alkaline Phos 25 - 125 66 61 67  AST 13 - 35 31 30 35  ALT 7 - 35 $Re'13 12 17     'Icb$ Lab Results  Component Value Date   CEA1 1.1 10/01/2020   /  CEA  Date Value Ref Range Status  10/01/2020 1.1 0.0 - 4.7 ng/mL Final    Comment:    (NOTE)                             Nonsmokers          <3.9                             Smokers             <5.6 [employer] Electrochemiluminescence Immunoassay (ECLIA) Values obtained with  different assay methods or kits cannot be used interchangeably.  Results cannot be interpreted as absolute evidence of the presence or absence of malignant disease. Performed At: [facility] [place], [place] [id number] Rush Farmer MD BB:[id number]     STUDIES:  No results found.    HISTORY:   Allergies:  Allergies  Allergen Reactions   Penicillins Rash    Current Medications: Current Outpatient Medications  Medication Sig Dispense Refill   Accu-Chek Softclix Lancets lancets USE TO CHECK BLOOD GLUCOSE TWICE DAILY.     albuterol (PROVENTIL) (2.5 MG/3ML) 0.083% nebulizer solution Take 2.5 mg by nebulization every 6 (six) hours as needed for wheezing or shortness of breath. (Patient not taking: Reported on 10/16/2020)     albuterol (VENTOLIN HFA) 108 (90 Base) MCG/ACT inhaler Inhale 2 puffs into the lungs every 6 (six) hours as needed for shortness of breath or wheezing.     aspirin 81 MG EC tablet Take 81 mg by mouth daily. (Patient not taking: No sig reported)     atorvastatin (LIPITOR) 40 MG tablet Take 40 mg by mouth daily.     cetirizine (ZYRTEC) 10 MG tablet Take 10 mg by mouth daily as needed for allergies. (Patient not taking: Reported on 01/10/2021)     dicyclomine (BENTYL) 10 MG capsule Take 1 capsule (10 mg total) by mouth 4 (four) times daily -  before meals and at bedtime. (Patient not taking: No sig reported) 90 capsule 0   diphenoxylate-atropine (LOMOTIL) 2.5-0.025 MG tablet Take 2 tablets by mouth 4 (four) times daily as needed for diarrhea or loose stools. 60 tablet 0   furosemide (LASIX) 20 MG tablet Take 20 mg by mouth daily.     gabapentin (NEURONTIN) 300 MG capsule Take 300 mg by mouth 2 (two) times daily.     glucose blood (ACCU-CHEK AVIVA PLUS) test strip Check blood glucose twice daily and as needed. Dx E 11.65  HYDROcodone-acetaminophen (NORCO) 7.5-325 MG tablet Take 1 tablet by mouth every 6 (six) hours as needed for moderate pain.  60 tablet 0   Lancets Misc. (ACCU-CHEK FASTCLIX LANCET) KIT Check blood glucose twice daily. Dx E 11.65     levothyroxine (SYNTHROID) 100 MCG tablet Take 100 mcg by mouth daily before breakfast.     metFORMIN (GLUCOPHAGE-XR) 500 MG 24 hr tablet Take 500 mg by mouth 2 (two) times daily with a meal.     montelukast (SINGULAIR) 10 MG tablet Take 10 mg by mouth daily.     ondansetron (ZOFRAN) 8 MG tablet Take 1 tablet (8 mg total) by mouth every 8 (eight) hours as needed for nausea or vomiting. 20 tablet 1   pantoprazole (PROTONIX) 40 MG tablet Take 40 mg by mouth 2 (two) times daily.     potassium chloride SA (KLOR-CON) 20 MEQ tablet Take 1 tablet (20 mEq total) by mouth 2 (two) times daily. 60 tablet 3   prochlorperazine (COMPAZINE) 10 MG tablet Take 1 tablet (10 mg total) by mouth every 6 (six) hours as needed for nausea or vomiting. 90 tablet 3   Current Facility-Administered Medications  Medication Dose Route Frequency Provider Last Rate Last Admin   triamcinolone acetonide (KENALOG) 10 MG/ML injection 10 mg  10 mg Other Once Landis Martins, DPM       triamcinolone acetonide (KENALOG-40) injection 20 mg  20 mg Other Once Landis Martins, DPM         ASSESSMENT & PLAN:   Assessment: 1.  Clinical stage IIIB ([id number]) rectal cancer, based on CT reveal any several small perirectal and presacral lymph nodes, which were highly concerning for metastatic involvement. She underwent abdominoperitoneal resection in April and lymph nodes were negative.   She is now receiving adjuvant FOLFOX chemotherapy with plans for 8 cycles.    Plan:   She will proceed with a 6th cycle of FOLFOX next week. She will return to clinic in 2 weeks for repeat evaluation prior to cycle 7.  I wrote a prescription for a glucometer, lancets and test strips as requested today.  She verbalizes understanding of and agreement to the plans discussed today. She knows to call the office should any new questions or concerns arise.     I, Rita Ohara, am acting as scribe for Derwood Kaplan, MD  I have reviewed this report as typed by the medical scribe, and it is complete and accurate.

## 2021-01-02 NOTE — Progress Notes (Signed)
Pt states that she did not take Evushield

## 2021-01-02 NOTE — Patient Instructions (Signed)
[facility]  Discharge Instructions: Thank you for choosing [facility] to provide your oncology and hematology care.  If you have a lab appointment with the [facility], please go directly to the [facility] and check in at the registration area.   Wear comfortable clothing and clothing appropriate for easy access to any Portacath or PICC line.   We strive to give you quality time with your provider. You may need to reschedule your appointment if you arrive late (15 or more minutes).  Arriving late affects you and other patients whose appointments are after yours.  Also, if you miss three or more appointments without notifying the office, you may be dismissed from the clinic at the provider's discretion.      For prescription refill requests, have your pharmacy contact our office and allow 72 hours for refills to be completed.    Today you received the following chemotherapy and/or immunotherapy agents:Pump medication disconnected today   To help prevent nausea and vomiting after your treatment, we encourage you to take your nausea medication as directed.  BELOW ARE SYMPTOMS THAT SHOULD BE REPORTED IMMEDIATELY: *FEVER GREATER THAN 100.4 F (38 C) OR HIGHER *CHILLS OR SWEATING *NAUSEA AND VOMITING THAT IS NOT CONTROLLED WITH YOUR NAUSEA MEDICATION *UNUSUAL SHORTNESS OF BREATH *UNUSUAL BRUISING OR BLEEDING *URINARY PROBLEMS (pain or burning when urinating, or frequent urination) *BOWEL PROBLEMS (unusual diarrhea, constipation, pain near the anus) TENDERNESS IN MOUTH AND THROAT WITH OR WITHOUT PRESENCE OF ULCERS (sore throat, sores in mouth, or a toothache) UNUSUAL RASH, SWELLING OR PAIN  UNUSUAL VAGINAL DISCHARGE OR ITCHING   Items with * indicate a potential emergency and should be followed up as soon as possible or go to the Emergency Department if any problems should occur.  Please show the CHEMOTHERAPY ALERT CARD or IMMUNOTHERAPY ALERT CARD  at check-in to the Emergency Department and triage nurse.  Should you have questions after your visit or need to cancel or reschedule your appointment, please contact [facility]  Dept: [phone]  and follow the prompts.  Office hours are 8:00 a.m. to 4:30 p.m. Monday - Friday. Please note that voicemails left after 4:00 p.m. may not be returned until the following business day.  We are closed weekends and major holidays. You have access to a nurse at all times for urgent questions. Please call the main number to the clinic Dept: [phone] and follow the prompts.  For any non-urgent questions, you may also contact your provider using MyChart. We now offer e-Visits for anyone 9 and older to request care online for non-urgent symptoms. For details visit [email or web address].   Also download the MyChart app! Go to the app store, search "MyChart", open the app, select [facility], and log in with your MyChart username and password.  Due to Covid, a mask is required upon entering the hospital/clinic. If you do not have a mask, one will be given to you upon arrival. For doctor visits, patients may have 1 support person aged 72 or older with them. For treatment visits, patients cannot have anyone with them due to current Covid guidelines and our immunocompromised population.

## 2021-01-08 ENCOUNTER — Telehealth: Payer: Self-pay

## 2021-01-08 ENCOUNTER — Inpatient Hospital Stay: Payer: Medicaid Other

## 2021-01-08 ENCOUNTER — Other Ambulatory Visit: Payer: Self-pay | Admitting: Oncology

## 2021-01-08 NOTE — Telephone Encounter (Signed)
Called patient because she missed her appt today. Left a message on the cell phone regarding the missed appt. I also tried to call the home number. No answer.

## 2021-01-10 ENCOUNTER — Telehealth: Payer: Self-pay | Admitting: Oncology

## 2021-01-10 ENCOUNTER — Inpatient Hospital Stay (INDEPENDENT_AMBULATORY_CARE_PROVIDER_SITE_OTHER): Payer: Medicaid Other | Admitting: Oncology

## 2021-01-10 ENCOUNTER — Other Ambulatory Visit: Payer: Self-pay | Admitting: Hematology and Oncology

## 2021-01-10 ENCOUNTER — Other Ambulatory Visit: Payer: Self-pay | Admitting: Oncology

## 2021-01-10 ENCOUNTER — Inpatient Hospital Stay: Payer: Medicaid Other | Attending: Oncology

## 2021-01-10 ENCOUNTER — Encounter: Payer: Self-pay | Admitting: Oncology

## 2021-01-10 VITALS — BP 181/76 | HR 73 | Temp 98.3°F | Resp 20 | Ht 60.0 in | Wt 178.9 lb

## 2021-01-10 DIAGNOSIS — C20 Malignant neoplasm of rectum: Secondary | ICD-10-CM

## 2021-01-10 DIAGNOSIS — Z5111 Encounter for antineoplastic chemotherapy: Secondary | ICD-10-CM | POA: Insufficient documentation

## 2021-01-10 DIAGNOSIS — Z23 Encounter for immunization: Secondary | ICD-10-CM | POA: Insufficient documentation

## 2021-01-10 LAB — BASIC METABOLIC PANEL
BUN: 12 (ref 4–21)
CO2: 27 — AB (ref 13–22)
Chloride: 102 (ref 99–108)
Creatinine: 0.6 (ref 0.5–1.1)
Glucose: 180
Potassium: 3.3 — AB (ref 3.4–5.3)
Sodium: 136 — AB (ref 137–147)

## 2021-01-10 LAB — HEPATIC FUNCTION PANEL
ALT: 13 (ref 7–35)
AST: 31 (ref 13–35)
Alkaline Phosphatase: 66 (ref 25–125)
Bilirubin, Total: 0.2

## 2021-01-10 LAB — CBC AND DIFFERENTIAL
HCT: 36 (ref 36–46)
Hemoglobin: 11.8 — AB (ref 12.0–16.0)
Neutrophils Absolute: 4.13
Platelets: 202 (ref 150–399)
WBC: 3.5

## 2021-01-10 LAB — COMPREHENSIVE METABOLIC PANEL
Albumin: 3.9 (ref 3.5–5.0)
Calcium: 9.1 (ref 8.7–10.7)

## 2021-01-10 LAB — CBC: RBC: 4.13 (ref 3.87–5.11)

## 2021-01-10 NOTE — Telephone Encounter (Signed)
Per 9/1 LOS next appt scheduled and given to patient

## 2021-01-10 NOTE — Telephone Encounter (Signed)
Per 9/1 LOS NEXT APPT SCHEDULED AND GIVEN TO PATIENT

## 2021-01-11 MED FILL — Fluorouracil IV Soln 2.5 GM/50ML (50 MG/ML): INTRAVENOUS | Qty: 12 | Status: AC

## 2021-01-11 MED FILL — Dexamethasone Sodium Phosphate Inj 100 MG/10ML: INTRAMUSCULAR | Qty: 1 | Status: AC

## 2021-01-11 MED FILL — Fluorouracil IV Soln 5 GM/100ML (50 MG/ML): INTRAVENOUS | Qty: 70 | Status: AC

## 2021-01-11 MED FILL — Leucovorin Calcium For Inj 350 MG: INTRAMUSCULAR | Qty: 29.3 | Status: AC

## 2021-01-11 MED FILL — Oxaliplatin IV Soln 100 MG/20ML: INTRAVENOUS | Qty: 25 | Status: AC

## 2021-01-11 MED FILL — Fosaprepitant Dimeglumine For IV Infusion 150 MG (Base Eq): INTRAVENOUS | Qty: 5 | Status: AC

## 2021-01-14 ENCOUNTER — Encounter: Payer: Self-pay | Admitting: Oncology

## 2021-01-15 ENCOUNTER — Encounter: Payer: Self-pay | Admitting: Oncology

## 2021-01-15 ENCOUNTER — Other Ambulatory Visit: Payer: Self-pay

## 2021-01-15 ENCOUNTER — Inpatient Hospital Stay: Payer: Medicaid Other

## 2021-01-15 VITALS — BP 125/96 | HR 77 | Temp 98.4°F | Resp 18 | Ht 60.0 in | Wt 183.1 lb

## 2021-01-15 DIAGNOSIS — C20 Malignant neoplasm of rectum: Secondary | ICD-10-CM

## 2021-01-15 DIAGNOSIS — Z23 Encounter for immunization: Secondary | ICD-10-CM | POA: Diagnosis not present

## 2021-01-15 DIAGNOSIS — Z5111 Encounter for antineoplastic chemotherapy: Secondary | ICD-10-CM | POA: Diagnosis not present

## 2021-01-15 MED ORDER — SODIUM CHLORIDE 0.9 % IV SOLN
10.0000 mg | Freq: Once | INTRAVENOUS | Status: AC
  Administered 2021-01-15: 10 mg via INTRAVENOUS
  Filled 2021-01-15: qty 1
  Filled 2021-01-15: qty 10

## 2021-01-15 MED ORDER — DEXTROSE 5 % IV SOLN: Freq: Once | INTRAVENOUS | Status: AC

## 2021-01-15 MED ORDER — OXALIPLATIN CHEMO INJECTION 100 MG/20ML
68.0000 mg/m2 | Freq: Once | INTRAVENOUS | Status: AC
  Administered 2021-01-15: 125 mg via INTRAVENOUS
  Filled 2021-01-15: qty 20

## 2021-01-15 MED ORDER — SODIUM CHLORIDE 0.9 % IV SOLN
1920.0000 mg/m2 | INTRAVENOUS | Status: DC
  Administered 2021-01-15: 3500 mg via INTRAVENOUS
  Filled 2021-01-15: qty 70

## 2021-01-15 MED ORDER — SODIUM CHLORIDE 0.9 % IV SOLN
150.0000 mg | Freq: Once | INTRAVENOUS | Status: AC
  Administered 2021-01-15: 150 mg via INTRAVENOUS
  Filled 2021-01-15 (×2): qty 5

## 2021-01-15 MED ORDER — DEXTROSE 5 % IV SOLN
320.0000 mg/m2 | Freq: Once | INTRAVENOUS | Status: AC
  Administered 2021-01-15: 586 mg via INTRAVENOUS
  Filled 2021-01-15: qty 29.3

## 2021-01-15 MED ORDER — FLUOROURACIL CHEMO INJECTION 2.5 GM/50ML
320.0000 mg/m2 | Freq: Once | INTRAVENOUS | Status: AC
  Administered 2021-01-15: 600 mg via INTRAVENOUS
  Filled 2021-01-15: qty 12

## 2021-01-15 MED ORDER — PALONOSETRON HCL INJECTION 0.25 MG/5ML
0.2500 mg | Freq: Once | INTRAVENOUS | Status: AC
  Administered 2021-01-15: 0.25 mg via INTRAVENOUS
  Filled 2021-01-15: qty 5

## 2021-01-15 NOTE — Patient Instructions (Signed)
[facility] [place]  Discharge Instructions: Thank you for choosing [facility] to provide your oncology and hematology care.  If you have a lab appointment with the [facility], please go directly to the [facility] and check in at the registration area.   Wear comfortable clothing and clothing appropriate for easy access to any Portacath or PICC line.   We strive to give you quality time with your provider. You may need to reschedule your appointment if you arrive late (15 or more minutes).  Arriving late affects you and other patients whose appointments are after yours.  Also, if you miss three or more appointments without notifying the office, you may be dismissed from the clinic at the provider's discretion.      For prescription refill requests, have your pharmacy contact our office and allow 72 hours for refills to be completed.    Today you received the following chemotherapy and/or immunotherapy agents folfox   To help prevent nausea and vomiting after your treatment, we encourage you to take your nausea medication as directed.  BELOW ARE SYMPTOMS THAT SHOULD BE REPORTED IMMEDIATELY: *FEVER GREATER THAN 100.4 F (38 C) OR HIGHER *CHILLS OR SWEATING *NAUSEA AND VOMITING THAT IS NOT CONTROLLED WITH YOUR NAUSEA MEDICATION *UNUSUAL SHORTNESS OF BREATH *UNUSUAL BRUISING OR BLEEDING *URINARY PROBLEMS (pain or burning when urinating, or frequent urination) *BOWEL PROBLEMS (unusual diarrhea, constipation, pain near the anus) TENDERNESS IN MOUTH AND THROAT WITH OR WITHOUT PRESENCE OF ULCERS (sore throat, sores in mouth, or a toothache) UNUSUAL RASH, SWELLING OR PAIN  UNUSUAL VAGINAL DISCHARGE OR ITCHING   Items with * indicate a potential emergency and should be followed up as soon as possible or go to the Emergency Department if any problems should occur.  Please show the CHEMOTHERAPY ALERT CARD or IMMUNOTHERAPY ALERT CARD at check-in to the Emergency  Department and triage nurse.  Should you have questions after your visit or need to cancel or reschedule your appointment, please contact [facility]  Dept: [phone]  and follow the prompts.  Office hours are 8:00 a.m. to 4:30 p.m. Monday - Friday. Please note that voicemails left after 4:00 p.m. may not be returned until the following business day.  We are closed weekends and major holidays. You have access to a nurse at all times for urgent questions. Please call the main number to the clinic Dept: [phone] and follow the prompts.  For any non-urgent questions, you may also contact your provider using MyChart. We now offer e-Visits for anyone 66 and older to request care online for non-urgent symptoms. For details visit [email or web address].   Also download the MyChart app! Go to the app store, search "MyChart", open the app, select [facility], and log in with your MyChart username and password.  Due to Covid, a mask is required upon entering the hospital/clinic. If you do not have a mask, one will be given to you upon arrival. For doctor visits, patients may have 1 support person aged 77 or older with them. For treatment visits, patients cannot have anyone with them due to current Covid guidelines and our immunocompromised population.   Leucovorin injection What is this medication? LEUCOVORIN (loo koe VOR in) is used to prevent or treat the harmful effects of some medicines. This medicine is used to treat anemia caused by a low amount of folic acid in the body. It is also used with 5-fluorouracil (5-FU) to treat colon cancer. This medicine  may be used for other purposes; ask your health care provider or pharmacist if you have questions. What should I tell my care team before I take this medication? They need to know if you have any of these conditions: anemia from low levels of vitamin B-12 in the blood an unusual or allergic reaction to leucovorin, folic  acid, other medicines, foods, dyes, or preservatives pregnant or trying to get pregnant breast-feeding How should I use this medication? This medicine is for injection into a muscle or into a vein. It is given by a health care professional in a hospital or clinic setting. Talk to your pediatrician regarding the use of this medicine in children. Special care may be needed. Overdosage: If you think you have taken too much of this medicine contact a poison control center or emergency room at once. NOTE: This medicine is only for you. Do not share this medicine with others. What if I miss a dose? This does not apply. What may interact with this medication? capecitabine fluorouracil phenobarbital phenytoin primidone trimethoprim-sulfamethoxazole This list may not describe all possible interactions. Give your health care provider a list of all the medicines, herbs, non-prescription drugs, or dietary supplements you use. Also tell them if you smoke, drink alcohol, or use illegal drugs. Some items may interact with your medicine. What should I watch for while using this medication? Your condition will be monitored carefully while you are receiving this medicine. This medicine may increase the side effects of 5-fluorouracil, 5-FU. Tell your doctor or health care professional if you have diarrhea or mouth sores that do not get better or that get worse. What side effects may I notice from receiving this medication? Side effects that you should report to your doctor or health care professional as soon as possible: allergic reactions like skin rash, itching or hives, swelling of the face, lips, or tongue breathing problems fever, infection mouth sores unusual bleeding or bruising unusually weak or tired Side effects that usually do not require medical attention (report to your doctor or health care professional if they continue or are bothersome): constipation or diarrhea loss of appetite nausea,  vomiting This list may not describe all possible side effects. Call your doctor for medical advice about side effects. You may report side effects to FDA at 1-800-FDA-1088. Where should I keep my medication? This drug is given in a hospital or clinic and will not be stored at home. NOTE: This sheet is a summary. It may not cover all possible information. If you have questions about this medicine, talk to your doctor, pharmacist, or health care provider.  2022 Elsevier/Gold Standard (2007-11-02 16:50:29) Oxaliplatin Injection What is this medication? OXALIPLATIN (ox AL i PLA tin) is a chemotherapy drug. It targets fast dividing cells, like cancer cells, and causes these cells to die. This medicine is used to treat cancers of the colon and rectum, and many other cancers. This medicine may be used for other purposes; ask your health care provider or pharmacist if you have questions. COMMON BRAND NAME(S): Eloxatin What should I tell my care team before I take this medication? They need to know if you have any of these conditions: heart disease history of irregular heartbeat liver disease low blood counts, like white cells, platelets, or red blood cells lung or breathing disease, like asthma take medicines that treat or prevent blood clots tingling of the fingers or toes, or other nerve disorder an unusual or allergic reaction to oxaliplatin, other chemotherapy, other medicines, foods, dyes,  or preservatives pregnant or trying to get pregnant breast-feeding How should I use this medication? This drug is given as an infusion into a vein. It is administered in a hospital or clinic by a specially trained health care professional. Talk to your pediatrician regarding the use of this medicine in children. Special care may be needed. Overdosage: If you think you have taken too much of this medicine contact a poison control center or emergency room at once. NOTE: This medicine is only for you. Do not  share this medicine with others. What if I miss a dose? It is important not to miss a dose. Call your doctor or health care professional if you are unable to keep an appointment. What may interact with this medication? Do not take this medicine with any of the following medications: cisapride dronedarone pimozide thioridazine This medicine may also interact with the following medications: aspirin and aspirin-like medicines certain medicines that treat or prevent blood clots like warfarin, apixaban, dabigatran, and rivaroxaban cisplatin cyclosporine diuretics medicines for infection like acyclovir, adefovir, amphotericin B, bacitracin, cidofovir, foscarnet, ganciclovir, gentamicin, pentamidine, vancomycin NSAIDs, medicines for pain and inflammation, like ibuprofen or naproxen other medicines that prolong the QT interval (an abnormal heart rhythm) pamidronate zoledronic acid This list may not describe all possible interactions. Give your health care provider a list of all the medicines, herbs, non-prescription drugs, or dietary supplements you use. Also tell them if you smoke, drink alcohol, or use illegal drugs. Some items may interact with your medicine. What should I watch for while using this medication? Your condition will be monitored carefully while you are receiving this medicine. You may need blood work done while you are taking this medicine. This medicine may make you feel generally unwell. This is not uncommon as chemotherapy can affect healthy cells as well as cancer cells. Report any side effects. Continue your course of treatment even though you feel ill unless your healthcare professional tells you to stop. This medicine can make you more sensitive to cold. Do not drink cold drinks or use ice. Cover exposed skin before coming in contact with cold temperatures or cold objects. When out in cold weather wear warm clothing and cover your mouth and nose to warm the air that goes  into your lungs. Tell your doctor if you get sensitive to the cold. Do not become pregnant while taking this medicine or for 9 months after stopping it. Women should inform their health care professional if they wish to become pregnant or think they might be pregnant. Men should not father a child while taking this medicine and for 6 months after stopping it. There is potential for serious side effects to an unborn child. Talk to your health care professional for more information. Do not breast-feed a child while taking this medicine or for 3 months after stopping it. This medicine has caused ovarian failure in some women. This medicine may make it more difficult to get pregnant. Talk to your health care professional if you are concerned about your fertility. This medicine has caused decreased sperm counts in some men. This may make it more difficult to father a child. Talk to your health care professional if you are concerned about your fertility. This medicine may increase your risk of getting an infection. Call your health care professional for advice if you get a fever, chills, or sore throat, or other symptoms of a cold or flu. Do not treat yourself. Try to avoid being around people who are sick.  Avoid taking medicines that contain aspirin, acetaminophen, ibuprofen, naproxen, or ketoprofen unless instructed by your health care professional. These medicines may hide a fever. Be careful brushing or flossing your teeth or using a toothpick because you may get an infection or bleed more easily. If you have any dental work done, tell your dentist you are receiving this medicine. What side effects may I notice from receiving this medication? Side effects that you should report to your doctor or health care professional as soon as possible: allergic reactions like skin rash, itching or hives, swelling of the face, lips, or tongue breathing problems cough low blood counts - this medicine may decrease the  number of white blood cells, red blood cells, and platelets. You may be at increased risk for infections and bleeding nausea, vomiting pain, redness, or irritation at site where injected pain, tingling, numbness in the hands or feet signs and symptoms of bleeding such as bloody or black, tarry stools; red or dark brown urine; spitting up blood or brown material that looks like coffee grounds; red spots on the skin; unusual bruising or bleeding from the eyes, gums, or nose signs and symptoms of a dangerous change in heartbeat or heart rhythm like chest pain; dizziness; fast, irregular heartbeat; palpitations; feeling faint or lightheaded; falls signs and symptoms of infection like fever; chills; cough; sore throat; pain or trouble passing urine signs and symptoms of liver injury like dark yellow or brown urine; general ill feeling or flu-like symptoms; light-colored stools; loss of appetite; nausea; right upper belly pain; unusually weak or tired; yellowing of the eyes or skin signs and symptoms of low red blood cells or anemia such as unusually weak or tired; feeling faint or lightheaded; falls signs and symptoms of muscle injury like dark urine; trouble passing urine or change in the amount of urine; unusually weak or tired; muscle pain; back pain Side effects that usually do not require medical attention (report to your doctor or health care professional if they continue or are bothersome): changes in taste diarrhea gas hair loss loss of appetite mouth sores This list may not describe all possible side effects. Call your doctor for medical advice about side effects. You may report side effects to FDA at 1-800-FDA-1088. Where should I keep my medication? This drug is given in a hospital or clinic and will not be stored at home. NOTE: This sheet is a summary. It may not cover all possible information. If you have questions about this medicine, talk to your doctor, pharmacist, or health care  provider.  2022 Elsevier/Gold Standard (2018-09-15 12:20:35) Fluorouracil, 5-FU injection What is this medication? FLUOROURACIL, 5-FU (flure oh YOOR a sil) is a chemotherapy drug. It slows the growth of cancer cells. This medicine is used to treat many types of cancer like breast cancer, colon or rectal cancer, pancreatic cancer, and stomach cancer. This medicine may be used for other purposes; ask your health care provider or pharmacist if you have questions. COMMON BRAND NAME(S): Adrucil What should I tell my care team before I take this medication? They need to know if you have any of these conditions: blood disorders dihydropyrimidine dehydrogenase (DPD) deficiency infection (especially a virus infection such as chickenpox, cold sores, or herpes) kidney disease liver disease malnourished, poor nutrition recent or ongoing radiation therapy an unusual or allergic reaction to fluorouracil, other chemotherapy, other medicines, foods, dyes, or preservatives pregnant or trying to get pregnant breast-feeding How should I use this medication? This drug is given as an  infusion or injection into a vein. It is administered in a hospital or clinic by a specially trained health care professional. Talk to your pediatrician regarding the use of this medicine in children. Special care may be needed. Overdosage: If you think you have taken too much of this medicine contact a poison control center or emergency room at once. NOTE: This medicine is only for you. Do not share this medicine with others. What if I miss a dose? It is important not to miss your dose. Call your doctor or health care professional if you are unable to keep an appointment. What may interact with this medication? Do not take this medicine with any of the following medications: live virus vaccines This medicine may also interact with the following medications: medicines that treat or prevent blood clots like warfarin,  enoxaparin, and dalteparin This list may not describe all possible interactions. Give your health care provider a list of all the medicines, herbs, non-prescription drugs, or dietary supplements you use. Also tell them if you smoke, drink alcohol, or use illegal drugs. Some items may interact with your medicine. What should I watch for while using this medication? Visit your doctor for checks on your progress. This drug may make you feel generally unwell. This is not uncommon, as chemotherapy can affect healthy cells as well as cancer cells. Report any side effects. Continue your course of treatment even though you feel ill unless your doctor tells you to stop. In some cases, you may be given additional medicines to help with side effects. Follow all directions for their use. Call your doctor or health care professional for advice if you get a fever, chills or sore throat, or other symptoms of a cold or flu. Do not treat yourself. This drug decreases your body's ability to fight infections. Try to avoid being around people who are sick. This medicine may increase your risk to bruise or bleed. Call your doctor or health care professional if you notice any unusual bleeding. Be careful brushing and flossing your teeth or using a toothpick because you may get an infection or bleed more easily. If you have any dental work done, tell your dentist you are receiving this medicine. Avoid taking products that contain aspirin, acetaminophen, ibuprofen, naproxen, or ketoprofen unless instructed by your doctor. These medicines may hide a fever. Do not become pregnant while taking this medicine. Women should inform their doctor if they wish to become pregnant or think they might be pregnant. There is a potential for serious side effects to an unborn child. Talk to your health care professional or pharmacist for more information. Do not breast-feed an infant while taking this medicine. Men should inform their doctor if  they wish to father a child. This medicine may lower sperm counts. Do not treat diarrhea with over the counter products. Contact your doctor if you have diarrhea that lasts more than 2 days or if it is severe and watery. This medicine can make you more sensitive to the sun. Keep out of the sun. If you cannot avoid being in the sun, wear protective clothing and use sunscreen. Do not use sun lamps or tanning beds/booths. What side effects may I notice from receiving this medication? Side effects that you should report to your doctor or health care professional as soon as possible: allergic reactions like skin rash, itching or hives, swelling of the face, lips, or tongue low blood counts - this medicine may decrease the number of white blood cells, red  blood cells and platelets. You may be at increased risk for infections and bleeding. signs of infection - fever or chills, cough, sore throat, pain or difficulty passing urine signs of decreased platelets or bleeding - bruising, pinpoint red spots on the skin, black, tarry stools, blood in the urine signs of decreased red blood cells - unusually weak or tired, fainting spells, lightheadedness breathing problems changes in vision chest pain mouth sores nausea and vomiting pain, swelling, redness at site where injected pain, tingling, numbness in the hands or feet redness, swelling, or sores on hands or feet stomach pain unusual bleeding Side effects that usually do not require medical attention (report to your doctor or health care professional if they continue or are bothersome): changes in finger or toe nails diarrhea dry or itchy skin hair loss headache loss of appetite sensitivity of eyes to the light stomach upset unusually teary eyes This list may not describe all possible side effects. Call your doctor for medical advice about side effects. You may report side effects to FDA at 1-800-FDA-1088. Where should I keep my medication? This  drug is given in a hospital or clinic and will not be stored at home. NOTE: This sheet is a summary. It may not cover all possible information. If you have questions about this medicine, talk to your doctor, pharmacist, or health care provider.  2022 Elsevier/Gold Standard (2019-03-29 15:00:03)

## 2021-01-17 ENCOUNTER — Other Ambulatory Visit: Payer: Self-pay

## 2021-01-17 ENCOUNTER — Inpatient Hospital Stay: Payer: Medicaid Other

## 2021-01-17 VITALS — BP 126/60 | HR 78 | Temp 99.0°F | Resp 18 | Ht 60.0 in | Wt 183.2 lb

## 2021-01-17 DIAGNOSIS — C20 Malignant neoplasm of rectum: Secondary | ICD-10-CM

## 2021-01-17 DIAGNOSIS — Z5111 Encounter for antineoplastic chemotherapy: Secondary | ICD-10-CM | POA: Diagnosis not present

## 2021-01-17 MED ORDER — SODIUM CHLORIDE 0.9% FLUSH
10.0000 mL | INTRAVENOUS | Status: DC | PRN
  Administered 2021-01-17: 10 mL

## 2021-01-17 MED ORDER — HEPARIN SOD (PORK) LOCK FLUSH 100 UNIT/ML IV SOLN
500.0000 [IU] | Freq: Once | INTRAVENOUS | Status: AC | PRN
  Administered 2021-01-17: 500 [IU]

## 2021-01-17 NOTE — Patient Instructions (Signed)
[facility]  Discharge Instructions: Thank you for choosing [facility] to provide your oncology and hematology care.  If you have a lab appointment with the Cancer Center, please go directly to the Cancer Center and check in at the registration area.   Wear comfortable clothing and clothing appropriate for easy access to any Portacath or PICC line.   We strive to give you quality time with your provider. You may need to reschedule your appointment if you arrive late (15 or more minutes).  Arriving late affects you and other patients whose appointments are after yours.  Also, if you miss three or more appointments without notifying the office, you may be dismissed from the clinic at the provider's discretion.      For prescription refill requests, have your pharmacy contact our office and allow 72 hours for refills to be completed.    Today you received the following chemotherapy and/or immunotherapy agents 5Flouruoracil      To help prevent nausea and vomiting after your treatment, we encourage you to take your nausea medication as directed.  BELOW ARE SYMPTOMS THAT SHOULD BE REPORTED IMMEDIATELY: *FEVER GREATER THAN 100.4 F (38 C) OR HIGHER *CHILLS OR SWEATING *NAUSEA AND VOMITING THAT IS NOT CONTROLLED WITH YOUR NAUSEA MEDICATION *UNUSUAL SHORTNESS OF BREATH *UNUSUAL BRUISING OR BLEEDING *URINARY PROBLEMS (pain or burning when urinating, or frequent urination) *BOWEL PROBLEMS (unusual diarrhea, constipation, pain near the anus) TENDERNESS IN MOUTH AND THROAT WITH OR WITHOUT PRESENCE OF ULCERS (sore throat, sores in mouth, or a toothache) UNUSUAL RASH, SWELLING OR PAIN  UNUSUAL VAGINAL DISCHARGE OR ITCHING   Items with * indicate a potential emergency and should be followed up as soon as possible or go to the Emergency Department if any problems should occur.  Please show the CHEMOTHERAPY ALERT CARD or IMMUNOTHERAPY ALERT CARD at check-in to the  Emergency Department and triage nurse.  Should you have questions after your visit or need to cancel or reschedule your appointment, please contact [facility]  Dept: [phone]  and follow the prompts.  Office hours are 8:00 a.m. to 4:30 p.m. Monday - Friday. Please note that voicemails left after 4:00 p.m. may not be returned until the following business day.  We are closed weekends and major holidays. You have access to a nurse at all times for urgent questions. Please call the main number to the clinic Dept: [phone] and follow the prompts.  For any non-urgent questions, you may also contact your provider using MyChart. We now offer e-Visits for anyone 18 and older to request care online for non-urgent symptoms. For details visit [email or web address].   Also download the MyChart app! Go to the app store, search "MyChart", open the app, select , and log in with your MyChart username and password.  Due to Covid, a mask is required upon entering the hospital/clinic. If you do not have a mask, one will be given to you upon arrival. For doctor visits, patients may have 1 support person aged 18 or older with them. For treatment visits, patients cannot have anyone with them due to current Covid guidelines and our immunocompromised population.    

## 2021-01-23 ENCOUNTER — Telehealth: Payer: Self-pay

## 2021-01-23 ENCOUNTER — Inpatient Hospital Stay: Payer: Medicaid Other

## 2021-01-23 NOTE — Telephone Encounter (Signed)
NO ANSWER, MISSED APPT FOR [name]

## 2021-01-24 ENCOUNTER — Encounter: Payer: Self-pay | Admitting: Hematology and Oncology

## 2021-01-24 ENCOUNTER — Other Ambulatory Visit: Payer: Self-pay | Admitting: Hematology and Oncology

## 2021-01-24 ENCOUNTER — Telehealth: Payer: Self-pay | Admitting: Oncology

## 2021-01-24 ENCOUNTER — Inpatient Hospital Stay: Payer: Medicaid Other

## 2021-01-24 ENCOUNTER — Inpatient Hospital Stay (INDEPENDENT_AMBULATORY_CARE_PROVIDER_SITE_OTHER): Payer: Medicaid Other | Admitting: Hematology and Oncology

## 2021-01-24 DIAGNOSIS — C20 Malignant neoplasm of rectum: Secondary | ICD-10-CM

## 2021-01-24 LAB — COMPREHENSIVE METABOLIC PANEL
Albumin: 4.2 (ref 3.5–5.0)
Calcium: 9.3 (ref 8.7–10.7)

## 2021-01-24 LAB — BASIC METABOLIC PANEL
BUN: 12 (ref 4–21)
CO2: 27 — AB (ref 13–22)
Chloride: 100 (ref 99–108)
Creatinine: 0.6 (ref 0.5–1.1)
Glucose: 153
Potassium: 3.9 (ref 3.4–5.3)
Sodium: 136 — AB (ref 137–147)

## 2021-01-24 LAB — CBC AND DIFFERENTIAL
HCT: 36 (ref 36–46)
Hemoglobin: 12 (ref 12.0–16.0)
Neutrophils Absolute: 1.62
Platelets: 184 (ref 150–399)
WBC: 3

## 2021-01-24 LAB — CBC
MCV: 87 (ref 81–99)
RBC: 4.15 (ref 3.87–5.11)

## 2021-01-24 LAB — HEPATIC FUNCTION PANEL
ALT: 16 (ref 7–35)
AST: 35 (ref 13–35)
Alkaline Phosphatase: 77 (ref 25–125)
Bilirubin, Total: 0.4

## 2021-01-24 MED ORDER — ONDANSETRON HCL 4 MG PO TABS: 4.0000 mg | ORAL_TABLET | ORAL | 3 refills | Status: DC | PRN

## 2021-01-24 MED ORDER — PROCHLORPERAZINE MALEATE 10 MG PO TABS: 10.0000 mg | ORAL_TABLET | Freq: Four times a day (QID) | ORAL | 3 refills | Status: DC | PRN

## 2021-01-24 NOTE — Progress Notes (Signed)
[facility]  [address] [place],  [place]  [address] [phone]  Clinic Day:  01/24/2021  Referring physician: Garnetta Buddy I, NP  ASSESSMENT & PLAN:   Assessment & Plan: Rectal cancer [facility]) She will proceed with cycle 10 FOLFOX next week. ANC is 1.62 today and we discussed there may be a need, with future cycles for addition of Zarxio to help boost her white count. She will return to clinic in 2 weeks for repeat evaluation prior to next cycle.    The patient understands the plans discussed today and is in agreement with them.  She knows to contact our office if she develops concerns prior to her next appointment.     [name], NP  [place] [address] [place] [address] Dept: [phone] Dept Fax: [phone]   No orders of the defined types were placed in this encounter.     CHIEF COMPLAINT:  CC: A 60 year old female with history of colorectal cancer here for 2 week evaluation prior to next FOLFOX  Current Treatment:  FOLFOX   HISTORY OF PRESENT ILLNESS:   Oncology History  Rectal cancer ([place])  04/22/2020 Cancer Staging   Staging form: Colon and Rectum, AJCC 8th Edition - Clinical stage from 04/22/2020: Stage IIIB (cT3, cN1b, cM0) - Signed by Derwood Kaplan, MD on 06/04/2020   04/23/2020 Initial Diagnosis   Rectal cancer ([place])   05/21/2020 - 06/22/2020 Chemotherapy          08/31/2020 Cancer Staging   Staging form: Colon and Rectum, AJCC 8th Edition - Pathologic stage from 08/31/2020: Stage IIA (ypT3, pN0, cM0) - Signed by Derwood Kaplan, MD on 01/14/2021 Histopathologic type: Adenocarcinoma, NOS Stage prefix: Post-therapy Response to neoadjuvant therapy: Partial response Total positive nodes: 0 Total nodes examined: 19 Histologic grading system: 4 grade system Histologic grade (G): G2 Residual tumor (R): R0 - None Tumor size  (mm): 30 Lymph-vascular invasion (LVI): LVI not present (absent)/not identified Diagnostic confirmation: Positive histology PLUS positive immunophenotyping and/or positive genetic studies Specimen type: Excision Staged by: Managing physician Tumor deposits (TD): Absent Perineural invasion (PNI): Absent Stage used in treatment planning: Yes National guidelines used in treatment planning: Yes Type of national guideline used in treatment planning: NCCN Staging comments: Rec FOLFOX chemotherapy x 4 months   10/09/2020 -  Chemotherapy    Patient is on Treatment Plan: COLORECTAL FOLFOX Q14D X 4 MONTHS       Hypokalemia     INTERVAL HISTORY:  Grace is here today for repeat clinical assessment. She has been well since last visit. She did report increased nausea with her most recent treatment. I advised that she keep her meds ATC a few days with her next cycle. She denies fever or chills. She denies shortness of breath, chest pain or cough. She denies issue with bowel or bladder. CBC and CMP are unremarkable. REVIEW OF SYSTEMS:  Review of Systems  Constitutional:  Negative for appetite change, chills, diaphoresis, fatigue, fever and unexpected weight change.  HENT:   Negative for hearing loss, lump/mass, mouth sores, nosebleeds, sore throat, tinnitus, trouble swallowing and voice change.   Eyes:  Negative for eye problems and icterus.  Respiratory:  Negative for chest tightness, cough, hemoptysis, shortness of breath and wheezing.   Cardiovascular:  Negative for chest pain, leg swelling and palpitations.  Gastrointestinal:  Negative for abdominal distention, abdominal pain, blood in stool, constipation, diarrhea, nausea,  rectal pain and vomiting.  Endocrine: Negative for hot flashes.  Genitourinary:  Negative for bladder incontinence, difficulty urinating, dyspareunia, dysuria, frequency, hematuria, menstrual problem, nocturia, pelvic pain, vaginal bleeding and vaginal discharge.    Musculoskeletal:  Negative for arthralgias, back pain, flank pain, gait problem, myalgias, neck pain and neck stiffness.  Skin:  Negative for itching, rash and wound.  Neurological:  Negative for dizziness, extremity weakness, gait problem, headaches, light-headedness, numbness, seizures and speech difficulty.  Hematological:  Negative for adenopathy. Does not bruise/bleed easily.  Psychiatric/Behavioral:  Negative for confusion, decreased concentration, depression, sleep disturbance and suicidal ideas. The patient is not nervous/anxious.     VITALS:  Blood pressure 137/70, pulse 69, temperature 98.3 F (36.8 C), temperature source Oral, resp. rate 20, height 5' (1.524 m), weight 179 lb 6 oz (81.4 kg), SpO2 97 %.  Wt Readings from Last 3 Encounters:  01/24/21 179 lb 6 oz (81.4 kg)  01/17/21 183 lb 4 oz (83.1 kg)  01/15/21 183 lb 1.3 oz (83 kg)    Body mass index is 35.03 kg/m.  Performance status (ECOG): 1 - Symptomatic but completely ambulatory  PHYSICAL EXAM:  Physical Exam Constitutional:      General: She is not in acute distress.    Appearance: Normal appearance. She is normal weight. She is not ill-appearing, toxic-appearing or diaphoretic.  HENT:     Head: Normocephalic and atraumatic.     Nose: Nose normal. No congestion or rhinorrhea.     Mouth/Throat:     Mouth: Mucous membranes are moist.     Pharynx: Oropharynx is clear. No oropharyngeal exudate or posterior oropharyngeal erythema.  Eyes:     General: No scleral icterus.       Right eye: No discharge.        Left eye: No discharge.     Extraocular Movements: Extraocular movements intact.     Conjunctiva/sclera: Conjunctivae normal.     Pupils: Pupils are equal, round, and reactive to light.  Neck:     Vascular: No carotid bruit.  Cardiovascular:     Rate and Rhythm: Normal rate and regular rhythm.     Heart sounds: No murmur heard.   No friction rub. No gallop.  Pulmonary:     Effort: Pulmonary effort is  normal. No respiratory distress.     Breath sounds: Normal breath sounds. No stridor. No wheezing, rhonchi or rales.  Chest:     Chest wall: No tenderness.  Abdominal:     General: Abdomen is flat. Bowel sounds are normal. There is no distension.     Palpations: There is no mass.     Tenderness: There is no abdominal tenderness. There is no right CVA tenderness, left CVA tenderness, guarding or rebound.     Hernia: No hernia is present.  Musculoskeletal:        General: No swelling, tenderness, deformity or signs of injury. Normal range of motion.     Cervical back: Normal range of motion and neck supple. No rigidity or tenderness.     Right lower leg: No edema.     Left lower leg: No edema.  Lymphadenopathy:     Cervical: No cervical adenopathy.  Skin:    General: Skin is warm and dry.     Capillary Refill: Capillary refill takes less than 2 seconds.     Coloration: Skin is not jaundiced or pale.     Findings: No bruising, erythema, lesion or rash.  Neurological:     General: No  focal deficit present.     Mental Status: She is alert and oriented to person, place, and time. Mental status is at baseline.     Cranial Nerves: No cranial nerve deficit.     Sensory: No sensory deficit.     Motor: No weakness.     Coordination: Coordination normal.     Gait: Gait normal.     Deep Tendon Reflexes: Reflexes normal.  Psychiatric:        Mood and Affect: Mood normal.        Behavior: Behavior normal.        Thought Content: Thought content normal.        Judgment: Judgment normal.    LABS:   CBC Latest Ref Rng & Units 01/24/2021 01/10/2021 12/28/2020  WBC - 3.0 3.5 3.3  Hemoglobin 12.0 - 16.0 12.0 11.8(A) 12.8  Hematocrit 36 - 46 36 36 39  Platelets 150 - 399 184 202 226   CMP Latest Ref Rng & Units 01/24/2021 01/10/2021 12/28/2020  Glucose 70 - 99 mg/dL - - -  BUN 4 - $R'21 12 12 15  'gR$ Creatinine 0.5 - 1.1 0.6 0.6 0.7  Sodium 137 - 147 136(A) 136(A) 138  Potassium 3.4 - 5.3 3.9 3.3(A) 4.1   Chloride 99 - 108 100 102 100  CO2 13 - 22 27(A) 27(A) 28(A)  Calcium 8.7 - 10.7 9.3 9.1 9.4  Total Protein 6.3 - 8.2 g/dL - - -  Alkaline Phos 25 - 125 77 66 61  AST 13 - 35 35 31 30  ALT 7 - 35 $Re'16 13 12     'HKU$ Lab Results  Component Value Date   CEA1 1.1 10/01/2020   /  CEA  Date Value Ref Range Status  10/01/2020 1.1 0.0 - 4.7 ng/mL Final    Comment:    (NOTE)                             Nonsmokers          <3.9                             Smokers             <5.6 [employer] Electrochemiluminescence Immunoassay (ECLIA) Values obtained with different assay methods or kits cannot be used interchangeably.  Results cannot be interpreted as absolute evidence of the presence or absence of malignant disease. Performed At: [facility] [place], [place] [id number] Rush Farmer MD PP:[id number]    No results found for: PSA1 No results found for: CAN199 No results found for: CAN125  No results found for: TOTALPROTELP, ALBUMINELP, A1GS, A2GS, BETS, BETA2SER, GAMS, MSPIKE, SPEI No results found for: TIBC, FERRITIN, IRONPCTSAT No results found for: LDH  STUDIES:  No results found.    HISTORY:   Past Medical History:  Diagnosis Date   Arthritis    Asthma    Cancer ([place]) 04/2020   colon   Diabetes mellitus without complication ([place])    Dyspnea    walking,   Family history of adverse reaction to anesthesia    daughter PONV   GERD (gastroesophageal reflux disease)    Hypertension    Hypothyroidism    Thyroid disease     Past Surgical History:  Procedure Laterality Date   ABDOMINAL HYSTERECTOMY     due to fibroids   CHOLECYSTECTOMY  HYSTERECTOMY ABDOMINAL WITH SALPINGO-OOPHORECTOMY     POLYPECTOMY     PORTA CATH INSERTION     TUBAL LIGATION     XI ROBOTIC ASSISTED LOWER ANTERIOR RESECTION N/A 08/22/2020   Procedure: XI ROBOTIC ABDOMINAL PERIITONEAL RESECTION;  Surgeon: Leighton Ruff, MD;  Location: [facility];  Service: General;   Laterality: N/A;  4 HOURS    Family History  Problem Relation Age of Onset   Diabetes Mother    Hypertension Mother    Heart disease Mother    Heart attack Mother    Diabetes Father    Hypertension Father    Diabetes Sister    COPD Sister     Social History:  reports that she has never smoked. She has never used smokeless tobacco. She reports that she does not currently use alcohol. She reports that she does not use drugs.The patient is alone  today.  Allergies:  Allergies  Allergen Reactions   Penicillins Rash    Current Medications: Current Outpatient Medications  Medication Sig Dispense Refill   Blood Glucose Monitoring Suppl (GLUCOCOM BLOOD GLUCOSE MONITOR) DEVI 1 each by Misc.(Non-Drug; Combo Route) route daily.     ondansetron (ZOFRAN) 4 MG tablet Take 1 tablet (4 mg total) by mouth every 4 (four) hours as needed for nausea. 90 tablet 3   prochlorperazine (COMPAZINE) 10 MG tablet Take 1 tablet (10 mg total) by mouth every 6 (six) hours as needed for nausea or vomiting. 90 tablet 3   Accu-Chek Softclix Lancets lancets USE TO CHECK BLOOD GLUCOSE TWICE DAILY.     albuterol (VENTOLIN HFA) 108 (90 Base) MCG/ACT inhaler Inhale 2 puffs into the lungs every 6 (six) hours as needed for shortness of breath or wheezing.     aspirin 81 MG EC tablet Take 81 mg by mouth daily. (Patient not taking: No sig reported)     atorvastatin (LIPITOR) 40 MG tablet Take 40 mg by mouth daily.     diphenoxylate-atropine (LOMOTIL) 2.5-0.025 MG tablet Take 2 tablets by mouth 4 (four) times daily as needed for diarrhea or loose stools. 60 tablet 0   furosemide (LASIX) 20 MG tablet Take 20 mg by mouth daily.     gabapentin (NEURONTIN) 300 MG capsule Take 300 mg by mouth 2 (two) times daily.     glucose blood (ACCU-CHEK AVIVA PLUS) test strip Check blood glucose twice daily and as needed. Dx E 11.65     HYDROcodone-acetaminophen (NORCO) 7.5-325 MG tablet Take 1 tablet by mouth every 6 (six) hours as needed  for moderate pain. 60 tablet 0   Lancets Misc. (ACCU-CHEK FASTCLIX LANCET) KIT Check blood glucose twice daily. Dx E 11.65     levothyroxine (SYNTHROID) 100 MCG tablet Take 100 mcg by mouth daily before breakfast.     metFORMIN (GLUCOPHAGE-XR) 500 MG 24 hr tablet Take 500 mg by mouth 2 (two) times daily with a meal.     montelukast (SINGULAIR) 10 MG tablet Take 10 mg by mouth daily.     ondansetron (ZOFRAN) 8 MG tablet Take 1 tablet (8 mg total) by mouth every 8 (eight) hours as needed for nausea or vomiting. 20 tablet 1   pantoprazole (PROTONIX) 40 MG tablet Take 40 mg by mouth 2 (two) times daily.     potassium chloride SA (KLOR-CON) 20 MEQ tablet Take 1 tablet (20 mEq total) by mouth 2 (two) times daily. 60 tablet 3   prochlorperazine (COMPAZINE) 10 MG tablet Take 1 tablet (10 mg total) by mouth every 6 (  six) hours as needed for nausea or vomiting. 90 tablet 3   Current Facility-Administered Medications  Medication Dose Route Frequency Provider Last Rate Last Admin   triamcinolone acetonide (KENALOG) 10 MG/ML injection 10 mg  10 mg Other Once Landis Martins, DPM       triamcinolone acetonide (KENALOG-40) injection 20 mg  20 mg Other Once Landis Martins, DPM

## 2021-01-24 NOTE — Telephone Encounter (Signed)
Per 9/15 LOS, patient scheduled for Oct Appt's.  Gave patient Appt Summary 

## 2021-01-24 NOTE — Assessment & Plan Note (Signed)
She will proceed with cycle 10 FOLFOX next week. ANC is 1.62 today and we discussed there may be a need, with future cycles for addition of Zarxio to help boost her white count. She will return to clinic in 2 weeks for repeat evaluation prior to next cycle.

## 2021-01-28 MED FILL — Fluorouracil IV Soln 5 GM/100ML (50 MG/ML): INTRAVENOUS | Qty: 70 | Status: AC

## 2021-01-28 MED FILL — Fluorouracil IV Soln 2.5 GM/50ML (50 MG/ML): INTRAVENOUS | Qty: 12 | Status: AC

## 2021-01-28 MED FILL — Oxaliplatin IV Soln 100 MG/20ML: INTRAVENOUS | Qty: 25 | Status: AC

## 2021-01-28 MED FILL — Dexamethasone Sodium Phosphate Inj 100 MG/10ML: INTRAMUSCULAR | Qty: 1 | Status: AC

## 2021-01-28 MED FILL — Fosaprepitant Dimeglumine For IV Infusion 150 MG (Base Eq): INTRAVENOUS | Qty: 5 | Status: AC

## 2021-01-28 MED FILL — Leucovorin Calcium For Inj 350 MG: INTRAMUSCULAR | Qty: 29.3 | Status: AC

## 2021-01-29 ENCOUNTER — Other Ambulatory Visit: Payer: Self-pay

## 2021-01-29 ENCOUNTER — Inpatient Hospital Stay: Payer: Medicaid Other

## 2021-01-29 VITALS — BP 165/81 | HR 90 | Temp 98.2°F | Resp 18 | Ht 60.0 in | Wt 180.0 lb

## 2021-01-29 DIAGNOSIS — Z5111 Encounter for antineoplastic chemotherapy: Secondary | ICD-10-CM | POA: Diagnosis not present

## 2021-01-29 DIAGNOSIS — E222 Syndrome of inappropriate secretion of antidiuretic hormone: Secondary | ICD-10-CM

## 2021-01-29 DIAGNOSIS — C20 Malignant neoplasm of rectum: Secondary | ICD-10-CM

## 2021-01-29 MED ORDER — DEXTROSE 5 % IV SOLN
Freq: Once | INTRAVENOUS | Status: AC
Start: 2021-01-29 — End: 2021-01-29

## 2021-01-29 MED ORDER — SODIUM CHLORIDE 0.9 % IV SOLN
10.0000 mg | Freq: Once | INTRAVENOUS | Status: AC
  Administered 2021-01-29: 10 mg via INTRAVENOUS
  Filled 2021-01-29: qty 10

## 2021-01-29 MED ORDER — OXALIPLATIN CHEMO INJECTION 100 MG/20ML
68.0000 mg/m2 | Freq: Once | INTRAVENOUS | Status: AC
  Administered 2021-01-29: 125 mg via INTRAVENOUS
  Filled 2021-01-29: qty 20

## 2021-01-29 MED ORDER — FLUOROURACIL CHEMO INJECTION 2.5 GM/50ML
320.0000 mg/m2 | Freq: Once | INTRAVENOUS | Status: AC
  Administered 2021-01-29: 600 mg via INTRAVENOUS
  Filled 2021-01-29: qty 12

## 2021-01-29 MED ORDER — PALONOSETRON HCL INJECTION 0.25 MG/5ML
0.2500 mg | Freq: Once | INTRAVENOUS | Status: AC
  Administered 2021-01-29: 0.25 mg via INTRAVENOUS
  Filled 2021-01-29: qty 5

## 2021-01-29 MED ORDER — SODIUM CHLORIDE 0.9 % IV SOLN
1920.0000 mg/m2 | INTRAVENOUS | Status: DC
  Administered 2021-01-29: 3500 mg via INTRAVENOUS
  Filled 2021-01-29: qty 70

## 2021-01-29 MED ORDER — LEUCOVORIN CALCIUM INJECTION 350 MG
320.0000 mg/m2 | Freq: Once | INTRAVENOUS | Status: AC
  Administered 2021-01-29: 586 mg via INTRAVENOUS
  Filled 2021-01-29: qty 29.3

## 2021-01-29 MED ORDER — INFLUENZA VAC SPLIT QUAD 0.5 ML IM SUSY
0.5000 mL | PREFILLED_SYRINGE | Freq: Once | INTRAMUSCULAR | Status: AC
  Administered 2021-01-29: 0.5 mL via INTRAMUSCULAR
  Filled 2021-01-29: qty 0.5

## 2021-01-29 MED ORDER — DEXTROSE 5 % IV SOLN: Freq: Once | INTRAVENOUS | Status: AC

## 2021-01-29 MED ORDER — SODIUM CHLORIDE 0.9 % IV SOLN
150.0000 mg | Freq: Once | INTRAVENOUS | Status: AC
  Administered 2021-01-29: 150 mg via INTRAVENOUS
  Filled 2021-01-29: qty 150

## 2021-01-29 NOTE — Patient Instructions (Signed)
Fluorouracil, 5-FU injection What is this medication? FLUOROURACIL, 5-FU (flure oh YOOR a sil) is a chemotherapy drug. It slows the growth of cancer cells. This medicine is used to treat many types of cancer like breast cancer, colon or rectal cancer, pancreatic cancer, and stomachcancer. This medicine may be used for other purposes; ask your health care provider orpharmacist if you have questions. COMMON BRAND NAME(S): Adrucil What should I tell my care team before I take this medication? They need to know if you have any of these conditions: blood disorders dihydropyrimidine dehydrogenase (DPD) deficiency infection (especially a virus infection such as chickenpox, cold sores, or herpes) kidney disease liver disease malnourished, poor nutrition recent or ongoing radiation therapy an unusual or allergic reaction to fluorouracil, other chemotherapy, other medicines, foods, dyes, or preservatives pregnant or trying to get pregnant breast-feeding How should I use this medication? This drug is given as an infusion or injection into a vein. It is administeredin a hospital or clinic by a specially trained health care professional. Talk to your pediatrician regarding the use of this medicine in children.Special care may be needed. Overdosage: If you think you have taken too much of this medicine contact apoison control center or emergency room at once. NOTE: This medicine is only for you. Do not share this medicine with others. What if I miss a dose? It is important not to miss your dose. Call your doctor or health careprofessional if you are unable to keep an appointment. What may interact with this medication? Do not take this medicine with any of the following medications: live virus vaccines This medicine may also interact with the following medications: medicines that treat or prevent blood clots like warfarin, enoxaparin, and dalteparin This list may not describe all possible  interactions. Give your health care provider a list of all the medicines, herbs, non-prescription drugs, or dietary supplements you use. Also tell them if you smoke, drink alcohol, or use illegaldrugs. Some items may interact with your medicine. What should I watch for while using this medication? Visit your doctor for checks on your progress. This drug may make you feel generally unwell. This is not uncommon, as chemotherapy can affect healthy cells as well as cancer cells. Report any side effects. Continue your course oftreatment even though you feel ill unless your doctor tells you to stop. In some cases, you may be given additional medicines to help with side effects.Follow all directions for their use. Call your doctor or health care professional for advice if you get a fever, chills or sore throat, or other symptoms of a cold or flu. Do not treat yourself. This drug decreases your body's ability to fight infections. Try toavoid being around people who are sick. This medicine may increase your risk to bruise or bleed. Call your doctor orhealth care professional if you notice any unusual bleeding. Be careful brushing and flossing your teeth or using a toothpick because you may get an infection or bleed more easily. If you have any dental work done,tell your dentist you are receiving this medicine. Avoid taking products that contain aspirin, acetaminophen, ibuprofen, naproxen, or ketoprofen unless instructed by your doctor. These medicines may hide afever. Do not become pregnant while taking this medicine. Women should inform their doctor if they wish to become pregnant or think they might be pregnant. There is a potential for serious side effects to an unborn child. Talk to your health care professional or pharmacist for more information. Do not breast-feed aninfant while taking this   medicine. Men should inform their doctor if they wish to father a child. This medicinemay lower sperm counts. Do not  treat diarrhea with over the counter products. Contact your doctor ifyou have diarrhea that lasts more than 2 days or if it is severe and watery. This medicine can make you more sensitive to the sun. Keep out of the sun. If you cannot avoid being in the sun, wear protective clothing and use sunscreen.Do not use sun lamps or tanning beds/booths. What side effects may I notice from receiving this medication? Side effects that you should report to your doctor or health care professionalas soon as possible: allergic reactions like skin rash, itching or hives, swelling of the face, lips, or tongue low blood counts - this medicine may decrease the number of white blood cells, red blood cells and platelets. You may be at increased risk for infections and bleeding. signs of infection - fever or chills, cough, sore throat, pain or difficulty passing urine signs of decreased platelets or bleeding - bruising, pinpoint red spots on the skin, black, tarry stools, blood in the urine signs of decreased red blood cells - unusually weak or tired, fainting spells, lightheadedness breathing problems changes in vision chest pain mouth sores nausea and vomiting pain, swelling, redness at site where injected pain, tingling, numbness in the hands or feet redness, swelling, or sores on hands or feet stomach pain unusual bleeding Side effects that usually do not require medical attention (report to yourdoctor or health care professional if they continue or are bothersome): changes in finger or toe nails diarrhea dry or itchy skin hair loss headache loss of appetite sensitivity of eyes to the light stomach upset unusually teary eyes This list may not describe all possible side effects. Call your doctor for medical advice about side effects. You may report side effects to FDA at1-800-FDA-1088. Where should I keep my medication? This drug is given in a hospital or clinic and will not be stored at home. NOTE:  This sheet is a summary. It may not cover all possible information. If you have questions about this medicine, talk to your doctor, pharmacist, orhealth care provider.  2022 Elsevier/Gold Standard (2019-03-29 15:00:03) Leucovorin injection What is this medication? LEUCOVORIN (loo koe VOR in) is used to prevent or treat the harmful effects of some medicines. This medicine is used to treat anemia caused by a low amount of folic acid in the body. It is also used with 5-fluorouracil (5-FU) to treatcolon cancer. This medicine may be used for other purposes; ask your health care provider orpharmacist if you have questions. What should I tell my care team before I take this medication? They need to know if you have any of these conditions: anemia from low levels of vitamin B-12 in the blood an unusual or allergic reaction to leucovorin, folic acid, other medicines, foods, dyes, or preservatives pregnant or trying to get pregnant breast-feeding How should I use this medication? This medicine is for injection into a muscle or into a vein. It is given by ahealth care professional in a hospital or clinic setting. Talk to your pediatrician regarding the use of this medicine in children.Special care may be needed. Overdosage: If you think you have taken too much of this medicine contact apoison control center or emergency room at once. NOTE: This medicine is only for you. Do not share this medicine with others. What if I miss a dose? This does not apply. What may interact with this medication? capecitabine fluorouracil phenobarbital   phenytoin primidone trimethoprim-sulfamethoxazole This list may not describe all possible interactions. Give your health care provider a list of all the medicines, herbs, non-prescription drugs, or dietary supplements you use. Also tell them if you smoke, drink alcohol, or use illegaldrugs. Some items may interact with your medicine. What should I watch for while using  this medication? Your condition will be monitored carefully while you are receiving thismedicine. This medicine may increase the side effects of 5-fluorouracil, 5-FU. Tell your doctor or health care professional if you have diarrhea or mouth sores that donot get better or that get worse. What side effects may I notice from receiving this medication? Side effects that you should report to your doctor or health care professionalas soon as possible: allergic reactions like skin rash, itching or hives, swelling of the face, lips, or tongue breathing problems fever, infection mouth sores unusual bleeding or bruising unusually weak or tired Side effects that usually do not require medical attention (report to yourdoctor or health care professional if they continue or are bothersome): constipation or diarrhea loss of appetite nausea, vomiting This list may not describe all possible side effects. Call your doctor for medical advice about side effects. You may report side effects to FDA at1-800-FDA-1088. Where should I keep my medication? This drug is given in a hospital or clinic and will not be stored at home. NOTE: This sheet is a summary. It may not cover all possible information. If you have questions about this medicine, talk to your doctor, pharmacist, orhealth care provider.  2022 Elsevier/Gold Standard (2007-11-02 16:50:29) Oxaliplatin Injection What is this medication? OXALIPLATIN (ox AL i PLA tin) is a chemotherapy drug. It targets fast dividing cells, like cancer cells, and causes these cells to die. This medicine is usedto treat cancers of the colon and rectum, and many other cancers. This medicine may be used for other purposes; ask your health care provider orpharmacist if you have questions. COMMON BRAND NAME(S): Eloxatin What should I tell my care team before I take this medication? They need to know if you have any of these conditions: heart disease history of irregular  heartbeat liver disease low blood counts, like white cells, platelets, or red blood cells lung or breathing disease, like asthma take medicines that treat or prevent blood clots tingling of the fingers or toes, or other nerve disorder an unusual or allergic reaction to oxaliplatin, other chemotherapy, other medicines, foods, dyes, or preservatives pregnant or trying to get pregnant breast-feeding How should I use this medication? This drug is given as an infusion into a vein. It is administered in a hospitalor clinic by a specially trained health care professional. Talk to your pediatrician regarding the use of this medicine in children.Special care may be needed. Overdosage: If you think you have taken too much of this medicine contact apoison control center or emergency room at once. NOTE: This medicine is only for you. Do not share this medicine with others. What if I miss a dose? It is important not to miss a dose. Call your doctor or health careprofessional if you are unable to keep an appointment. What may interact with this medication? Do not take this medicine with any of the following medications: cisapride dronedarone pimozide thioridazine This medicine may also interact with the following medications: aspirin and aspirin-like medicines certain medicines that treat or prevent blood clots like warfarin, apixaban, dabigatran, and rivaroxaban cisplatin cyclosporine diuretics medicines for infection like acyclovir, adefovir, amphotericin B, bacitracin, cidofovir, foscarnet, ganciclovir, gentamicin, pentamidine, vancomycin NSAIDs,   medicines for pain and inflammation, like ibuprofen or naproxen other medicines that prolong the QT interval (an abnormal heart rhythm) pamidronate zoledronic acid This list may not describe all possible interactions. Give your health care provider a list of all the medicines, herbs, non-prescription drugs, or dietary supplements you use. Also tell  them if you smoke, drink alcohol, or use illegaldrugs. Some items may interact with your medicine. What should I watch for while using this medication? Your condition will be monitored carefully while you are receiving thismedicine. You may need blood work done while you are taking this medicine. This medicine may make you feel generally unwell. This is not uncommon as chemotherapy can affect healthy cells as well as cancer cells. Report any side effects. Continue your course of treatment even though you feel ill unless yourhealthcare professional tells you to stop. This medicine can make you more sensitive to cold. Do not drink cold drinks or use ice. Cover exposed skin before coming in contact with cold temperatures or cold objects. When out in cold weather wear warm clothing and cover your mouth and nose to warm the air that goes into your lungs. Tell your doctor if you getsensitive to the cold. Do not become pregnant while taking this medicine or for 9 months after stopping it. Women should inform their health care professional if they wish to become pregnant or think they might be pregnant. Men should not father a child while taking this medicine and for 6 months after stopping it. There is potential for serious side effects to an unborn child. Talk to your health careprofessional for more information. Do not breast-feed a child while taking this medicine or for 3 months afterstopping it. This medicine has caused ovarian failure in some women. This medicine may make it more difficult to get pregnant. Talk to your health care professional if youare concerned about your fertility. This medicine has caused decreased sperm counts in some men. This may make it more difficult to father a child. Talk to your health care professional if youare concerned about your fertility. This medicine may increase your risk of getting an infection. Call your health care professional for advice if you get a fever, chills,  or sore throat, or other symptoms of a cold or flu. Do not treat yourself. Try to avoid beingaround people who are sick. Avoid taking medicines that contain aspirin, acetaminophen, ibuprofen, naproxen, or ketoprofen unless instructed by your health care professional.These medicines may hide a fever. Be careful brushing or flossing your teeth or using a toothpick because you may get an infection or bleed more easily. If you have any dental work done, tellyour dentist you are receiving this medicine. What side effects may I notice from receiving this medication? Side effects that you should report to your doctor or health care professionalas soon as possible: allergic reactions like skin rash, itching or hives, swelling of the face, lips, or tongue breathing problems cough low blood counts - this medicine may decrease the number of white blood cells, red blood cells, and platelets. You may be at increased risk for infections and bleeding nausea, vomiting pain, redness, or irritation at site where injected pain, tingling, numbness in the hands or feet signs and symptoms of bleeding such as bloody or black, tarry stools; red or dark brown urine; spitting up blood or brown material that looks like coffee grounds; red spots on the skin; unusual bruising or bleeding from the eyes, gums, or nose signs and symptoms of a dangerous   change in heartbeat or heart rhythm like chest pain; dizziness; fast, irregular heartbeat; palpitations; feeling faint or lightheaded; falls signs and symptoms of infection like fever; chills; cough; sore throat; pain or trouble passing urine signs and symptoms of liver injury like dark yellow or brown urine; general ill feeling or flu-like symptoms; light-colored stools; loss of appetite; nausea; right upper belly pain; unusually weak or tired; yellowing of the eyes or skin signs and symptoms of low red blood cells or anemia such as unusually weak or tired; feeling faint or  lightheaded; falls signs and symptoms of muscle injury like dark urine; trouble passing urine or change in the amount of urine; unusually weak or tired; muscle pain; back pain Side effects that usually do not require medical attention (report to yourdoctor or health care professional if they continue or are bothersome): changes in taste diarrhea gas hair loss loss of appetite mouth sores This list may not describe all possible side effects. Call your doctor for medical advice about side effects. You may report side effects to FDA at1-800-FDA-1088. Where should I keep my medication? This drug is given in a hospital or clinic and will not be stored at home. NOTE: This sheet is a summary. It may not cover all possible information. If you have questions about this medicine, talk to your doctor, pharmacist, orhealth care provider.  2022 Elsevier/Gold Standard (2018-09-15 12:20:35)  

## 2021-01-31 ENCOUNTER — Inpatient Hospital Stay: Payer: Medicaid Other

## 2021-01-31 ENCOUNTER — Other Ambulatory Visit: Payer: Self-pay

## 2021-01-31 VITALS — BP 139/74 | HR 83 | Temp 98.0°F | Resp 18 | Ht 60.0 in | Wt 179.0 lb

## 2021-01-31 DIAGNOSIS — C20 Malignant neoplasm of rectum: Secondary | ICD-10-CM

## 2021-01-31 DIAGNOSIS — Z5111 Encounter for antineoplastic chemotherapy: Secondary | ICD-10-CM | POA: Diagnosis not present

## 2021-01-31 MED ORDER — HEPARIN SOD (PORK) LOCK FLUSH 100 UNIT/ML IV SOLN
500.0000 [IU] | Freq: Once | INTRAVENOUS | Status: AC | PRN
  Administered 2021-01-31: 500 [IU]

## 2021-01-31 MED ORDER — SODIUM CHLORIDE 0.9% FLUSH
10.0000 mL | INTRAVENOUS | Status: DC | PRN
  Administered 2021-01-31: 10 mL

## 2021-01-31 NOTE — Progress Notes (Signed)
[facility]  [address] [place],  [place]  [address] [phone]  Clinic Day:  02/07/2021  Referring physician: Garnetta Buddy I, NP  This document serves as a record of services personally performed by Hosie Poisson, MD. It was created on their behalf by Curry,Lauren E, a trained medical scribe. The creation of this record is based on the scribe's personal observations and the provider's statements to them.  ASSESSMENT & PLAN:   Assessment: 1.  Clinical stage IIIB ([id number]) rectal cancer, based on CT reveal any several small perirectal and presacral lymph nodes, which were highly concerning for metastatic involvement. She underwent abdominoperitoneal resection in April and lymph nodes were negative.   She is receiving adjuvant FOLFOX chemotherapy with plans for 8 cycles.     Plan: She will proceed with an 8th and final cycle of FOLFOX next week. We will plan to see her back in 1 month with CBC, CMP, and CEA for repeat evaluation. Following that visit, we can go to 3 month follow up. At that point, she will be due for port flush. She verbalizes understanding of and agreement to the plans discussed today. She knows to call the office should any new questions or concerns arise.   I provided 15 minutes of face-to-face time during this this encounter and > 50% was spent counseling as documented under my assessment and plan.    [facility] [address] [place] [address] Dept: [phone] Dept Fax: [phone]   No orders of the defined types were placed in this encounter.   CHIEF COMPLAINT:  CC:  Stage IIIB rectal cancer   Current Treatment:  FOLFOX   HISTORY OF PRESENT ILLNESS:   Oncology History  Rectal cancer ([place])  04/20/2020 Procedure   COLONOSCOPY: Rectal mass beginning at about 2 cm from the anal verge and was noted to obstruct 50% of the circumference of the  rectum.  Benign polyps were also present and removed.   04/20/2020 Pathology Results   Rectum, biopsy:  -  Invasive adenocarcinoma    04/22/2020 Cancer Staging   Staging form: Colon and Rectum, AJCC 8th Edition - Clinical stage from 04/22/2020: Stage IIIB (cT3, cN1b, cM0) - Signed by Derwood Kaplan, MD on 06/04/2020   04/23/2020 Initial Diagnosis   Rectal cancer ([place])   04/24/2020 Imaging   CT ABDOMEN AND PELVIS WITH CONTRAST: 1. Known rectal lesion not well demonstrated by CT. There are several small perirectal and presacral lymph nodes measuring up to 8 mm short axis. These are highly concerning for metastatic involvement. 2. Right middle lobe pulmonary nodules, stable 2016 and consistent with benign etiology. 3. 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 4. Aortic Atherosclerosis (ICD10-I70.0).   05/17/2020 - 06/26/2020 Radiation Therapy   1. Pelvis:  4500 cGy in 25 fractions 2. Rectal boost:  540 cGy in 3 fractions   05/21/2020 - 06/22/2020 Chemotherapy   Concurrent chemoradiation with infusional 5 FU        08/22/2020 Pathology Results   A. COLON, SIGMOID, RECTUM, ANUS, RESECTION:  - Invasive colorectal adenocarcinoma, 3 cm.  - Carcinoma extends into perirectal connective tissue.  - Nineteen benign lymph nodes (0/19).  - Margins not involved.    08/31/2020 Cancer Staging   Staging form: Colon and Rectum, AJCC 8th Edition - Pathologic stage from 08/31/2020: Stage IIA (ypT3, pN0, cM0) - Signed by Derwood Kaplan, MD on 01/14/2021 Histopathologic type:  Adenocarcinoma, NOS Stage prefix: Post-therapy Response to neoadjuvant therapy: Partial response Total positive nodes: 0 Total nodes examined: 19 Histologic grading system: 4 grade system Histologic grade (G): G2 Residual tumor (R): R0 - None Tumor size (mm): 30 Lymph-vascular invasion (LVI): LVI not present (absent)/not identified Diagnostic confirmation: Positive histology PLUS  positive immunophenotyping and/or positive genetic studies Specimen type: Excision Staged by: Managing physician Tumor deposits (TD): Absent Perineural invasion (PNI): Absent Stage used in treatment planning: Yes National guidelines used in treatment planning: Yes Type of national guideline used in treatment planning: NCCN Staging comments: Rec FOLFOX chemotherapy x 4 months   10/09/2020 -  Chemotherapy    Patient is on Treatment Plan: COLORECTAL FOLFOX Q14D X 4 MONTHS       Hypokalemia     INTERVAL HISTORY:  Tanya Mcclure is here for routine follow up prior to an 8th and final cycle of FOLFOX. She states that she is doing well and denies complaints other than intermittent loose bowels. White count has increased from 3.0 to 3.5 with an ANC of 1890, and hemoglobin and platelets are normal. Chemistries are unremarkable except for a potassium of 3.3. Non-fasting blood glucose was 199. She recently resumed oral supplement 20 meq daily, and I advised that she increase this to BID. Her  appetite is good, and she has gained 3 and 1/2 pounds since her last visit.  She denies fever, chills or other signs of infection.  She denies nausea, vomiting, bowel issues, or abdominal pain.  She denies sore throat, cough, dyspnea, or chest pain.  REVIEW OF SYSTEMS:  Review of Systems  Constitutional: Negative.  Negative for appetite change, chills, fatigue, fever and unexpected weight change.  HENT:  Negative.    Eyes: Negative.   Respiratory: Negative.  Negative for chest tightness, cough, hemoptysis, shortness of breath and wheezing.   Cardiovascular: Negative.  Negative for chest pain, leg swelling and palpitations.  Gastrointestinal: Negative.  Negative for abdominal distention, abdominal pain, blood in stool, constipation, diarrhea, nausea and vomiting.  Endocrine: Negative.   Genitourinary: Negative.  Negative for difficulty urinating, dysuria, frequency and hematuria.   Musculoskeletal: Negative.  Negative  for arthralgias, back pain, flank pain, gait problem and myalgias.  Skin: Negative.   Neurological: Negative.  Negative for dizziness, extremity weakness, gait problem, headaches, light-headedness, numbness, seizures and speech difficulty.  Hematological: Negative.   Psychiatric/Behavioral: Negative.  Negative for depression and sleep disturbance. The patient is not nervous/anxious.     VITALS:  Blood pressure (!) 157/81, pulse 76, temperature 98.8 F (37.1 C), temperature source Oral, resp. rate 18, height 5' (1.524 m), weight 183 lb (83 kg), SpO2 98 %.  Wt Readings from Last 3 Encounters:  02/07/21 183 lb (83 kg)  01/31/21 179 lb (81.2 kg)  01/29/21 180 lb (81.6 kg)    Body mass index is 35.74 kg/m.  Performance status (ECOG): 0 - Asymptomatic  PHYSICAL EXAM:  Physical Exam Constitutional:      General: She is not in acute distress.    Appearance: Normal appearance. She is normal weight.  HENT:     Head: Normocephalic and atraumatic.  Eyes:     General: No scleral icterus.    Extraocular Movements: Extraocular movements intact.     Conjunctiva/sclera: Conjunctivae normal.     Pupils: Pupils are equal, round, and reactive to light.  Cardiovascular:     Rate and Rhythm: Normal rate and regular rhythm.     Pulses: Normal pulses.     Heart sounds: Normal  heart sounds. No murmur heard.   No friction rub. No gallop.  Pulmonary:     Effort: Pulmonary effort is normal. No respiratory distress.     Breath sounds: Normal breath sounds.  Abdominal:     General: Bowel sounds are normal. There is no distension.     Palpations: Abdomen is soft. There is no hepatomegaly, splenomegaly or mass.     Tenderness: There is no abdominal tenderness.  Musculoskeletal:        General: Normal range of motion.     Cervical back: Normal range of motion and neck supple.     Right lower leg: No edema.     Left lower leg: No edema.  Lymphadenopathy:     Cervical: No cervical adenopathy.  Skin:     General: Skin is warm and dry.  Neurological:     General: No focal deficit present.     Mental Status: She is alert and oriented to person, place, and time. Mental status is at baseline.  Psychiatric:        Mood and Affect: Mood normal.        Behavior: Behavior normal.        Thought Content: Thought content normal.        Judgment: Judgment normal.    LABS:   CBC Latest Ref Rng & Units 02/07/2021 01/24/2021 01/10/2021  WBC - 3.5 3.0 3.5  Hemoglobin 12.0 - 16.0 12.1 12.0 11.8(A)  Hematocrit 36 - 46 36 36 36  Platelets 150 - 399 166 184 202   CMP Latest Ref Rng & Units 02/07/2021 01/24/2021 01/10/2021  Glucose 70 - 99 mg/dL - - -  BUN 4 - $R'21 10 12 12  'TB$ Creatinine 0.5 - 1.1 0.7 0.6 0.6  Sodium 137 - 147 137 136(A) 136(A)  Potassium 3.4 - 5.3 3.3(A) 3.9 3.3(A)  Chloride 99 - 108 100 100 102  CO2 13 - 22 29(A) 27(A) 27(A)  Calcium 8.7 - 10.7 9.1 9.3 9.1  Total Protein 6.3 - 8.2 g/dL - - -  Alkaline Phos 25 - 125 77 77 66  AST 13 - 35 32 35 31  ALT 7 - 35 $Re'15 16 13     'dvM$ Lab Results  Component Value Date   CEA1 1.1 10/01/2020   /  CEA  Date Value Ref Range Status  10/01/2020 1.1 0.0 - 4.7 ng/mL Final    Comment:    (NOTE)                             Nonsmokers          <3.9                             Smokers             <5.6 [employer] Electrochemiluminescence Immunoassay (ECLIA) Values obtained with different assay methods or kits cannot be used interchangeably.  Results cannot be interpreted as absolute evidence of the presence or absence of malignant disease. Performed At: [facility] [place], [place] [id number] Rush Farmer MD VH:[id number]      STUDIES:  No results found.    HISTORY:   Allergies:  Allergies  Allergen Reactions   Penicillins Rash    Current Medications: Current Outpatient Medications  Medication Sig Dispense Refill   Accu-Chek Softclix Lancets lancets USE TO CHECK BLOOD GLUCOSE TWICE DAILY.  albuterol (VENTOLIN HFA) 108 (90 Base) MCG/ACT inhaler Inhale 2 puffs into the lungs every 6 (six) hours as needed for shortness of breath or wheezing.     aspirin 81 MG EC tablet Take 81 mg by mouth daily. (Patient not taking: No sig reported)     atorvastatin (LIPITOR) 40 MG tablet Take 40 mg by mouth daily.     Blood Glucose Monitoring Suppl (GLUCOCOM BLOOD GLUCOSE MONITOR) DEVI 1 each by Misc.(Non-Drug; Combo Route) route daily.     diphenoxylate-atropine (LOMOTIL) 2.5-0.025 MG tablet Take 2 tablets by mouth 4 (four) times daily as needed for diarrhea or loose stools. 60 tablet 0   furosemide (LASIX) 20 MG tablet Take 20 mg by mouth daily.     gabapentin (NEURONTIN) 300 MG capsule Take 300 mg by mouth 2 (two) times daily.     glucose blood (ACCU-CHEK AVIVA PLUS) test strip Check blood glucose twice daily and as needed. Dx E 11.65     HYDROcodone-acetaminophen (NORCO) 7.5-325 MG tablet Take 1 tablet by mouth every 6 (six) hours as needed for moderate pain. 60 tablet 0   Lancets Misc. (ACCU-CHEK FASTCLIX LANCET) KIT Check blood glucose twice daily. Dx E 11.65     levothyroxine (SYNTHROID) 100 MCG tablet Take 100 mcg by mouth daily before breakfast.     metFORMIN (GLUCOPHAGE-XR) 500 MG 24 hr tablet Take 500 mg by mouth 2 (two) times daily with a meal.     montelukast (SINGULAIR) 10 MG tablet Take 10 mg by mouth daily.     ondansetron (ZOFRAN) 4 MG tablet Take 1 tablet (4 mg total) by mouth every 4 (four) hours as needed for nausea. 90 tablet 3   ondansetron (ZOFRAN) 8 MG tablet Take 1 tablet (8 mg total) by mouth every 8 (eight) hours as needed for nausea or vomiting. 20 tablet 1   pantoprazole (PROTONIX) 40 MG tablet Take 40 mg by mouth 2 (two) times daily.     potassium chloride SA (KLOR-CON) 20 MEQ tablet Take 1 tablet (20 mEq total) by mouth 2 (two) times daily. 60 tablet 3   prochlorperazine (COMPAZINE) 10 MG tablet Take 1 tablet (10 mg total) by mouth every 6 (six) hours as needed for nausea  or vomiting. 90 tablet 3   prochlorperazine (COMPAZINE) 10 MG tablet Take 1 tablet (10 mg total) by mouth every 6 (six) hours as needed for nausea or vomiting. 90 tablet 3   Current Facility-Administered Medications  Medication Dose Route Frequency Provider Last Rate Last Admin   triamcinolone acetonide (KENALOG) 10 MG/ML injection 10 mg  10 mg Other Once Landis Martins, DPM       triamcinolone acetonide (KENALOG-40) injection 20 mg  20 mg Other Once Landis Martins, DPM

## 2021-01-31 NOTE — Patient Instructions (Signed)
Fluorouracil, 5-FU injection What is this medication? FLUOROURACIL, 5-FU (flure oh YOOR a sil) is a chemotherapy drug. It slows the growth of cancer cells. This medicine is used to treat many types of cancer like breast cancer, colon or rectal cancer, pancreatic cancer, and stomach cancer. This medicine may be used for other purposes; ask your health care provider or pharmacist if you have questions. COMMON BRAND NAME(S): Adrucil What should I tell my care team before I take this medication? They need to know if you have any of these conditions: blood disorders dihydropyrimidine dehydrogenase (DPD) deficiency infection (especially a virus infection such as chickenpox, cold sores, or herpes) kidney disease liver disease malnourished, poor nutrition recent or ongoing radiation therapy an unusual or allergic reaction to fluorouracil, other chemotherapy, other medicines, foods, dyes, or preservatives pregnant or trying to get pregnant breast-feeding How should I use this medication? This drug is given as an infusion or injection into a vein. It is administered in a hospital or clinic by a specially trained health care professional. Talk to your pediatrician regarding the use of this medicine in children. Special care may be needed. Overdosage: If you think you have taken too much of this medicine contact a poison control center or emergency room at once. NOTE: This medicine is only for you. Do not share this medicine with others. What if I miss a dose? It is important not to miss your dose. Call your doctor or health care professional if you are unable to keep an appointment. What may interact with this medication? Do not take this medicine with any of the following medications: live virus vaccines This medicine may also interact with the following medications: medicines that treat or prevent blood clots like warfarin, enoxaparin, and dalteparin This list may not describe all possible  interactions. Give your health care provider a list of all the medicines, herbs, non-prescription drugs, or dietary supplements you use. Also tell them if you smoke, drink alcohol, or use illegal drugs. Some items may interact with your medicine. What should I watch for while using this medication? Visit your doctor for checks on your progress. This drug may make you feel generally unwell. This is not uncommon, as chemotherapy can affect healthy cells as well as cancer cells. Report any side effects. Continue your course of treatment even though you feel ill unless your doctor tells you to stop. In some cases, you may be given additional medicines to help with side effects. Follow all directions for their use. Call your doctor or health care professional for advice if you get a fever, chills or sore throat, or other symptoms of a cold or flu. Do not treat yourself. This drug decreases your body's ability to fight infections. Try to avoid being around people who are sick. This medicine may increase your risk to bruise or bleed. Call your doctor or health care professional if you notice any unusual bleeding. Be careful brushing and flossing your teeth or using a toothpick because you may get an infection or bleed more easily. If you have any dental work done, tell your dentist you are receiving this medicine. Avoid taking products that contain aspirin, acetaminophen, ibuprofen, naproxen, or ketoprofen unless instructed by your doctor. These medicines may hide a fever. Do not become pregnant while taking this medicine. Women should inform their doctor if they wish to become pregnant or think they might be pregnant. There is a potential for serious side effects to an unborn child. Talk to your health care   professional or pharmacist for more information. Do not breast-feed an infant while taking this medicine. Men should inform their doctor if they wish to father a child. This medicine may lower sperm  counts. Do not treat diarrhea with over the counter products. Contact your doctor if you have diarrhea that lasts more than 2 days or if it is severe and watery. This medicine can make you more sensitive to the sun. Keep out of the sun. If you cannot avoid being in the sun, wear protective clothing and use sunscreen. Do not use sun lamps or tanning beds/booths. What side effects may I notice from receiving this medication? Side effects that you should report to your doctor or health care professional as soon as possible: allergic reactions like skin rash, itching or hives, swelling of the face, lips, or tongue low blood counts - this medicine may decrease the number of white blood cells, red blood cells and platelets. You may be at increased risk for infections and bleeding. signs of infection - fever or chills, cough, sore throat, pain or difficulty passing urine signs of decreased platelets or bleeding - bruising, pinpoint red spots on the skin, black, tarry stools, blood in the urine signs of decreased red blood cells - unusually weak or tired, fainting spells, lightheadedness breathing problems changes in vision chest pain mouth sores nausea and vomiting pain, swelling, redness at site where injected pain, tingling, numbness in the hands or feet redness, swelling, or sores on hands or feet stomach pain unusual bleeding Side effects that usually do not require medical attention (report to your doctor or health care professional if they continue or are bothersome): changes in finger or toe nails diarrhea dry or itchy skin hair loss headache loss of appetite sensitivity of eyes to the light stomach upset unusually teary eyes This list may not describe all possible side effects. Call your doctor for medical advice about side effects. You may report side effects to FDA at 1-800-FDA-1088. Where should I keep my medication? This drug is given in a hospital or clinic and will not be  stored at home. NOTE: This sheet is a summary. It may not cover all possible information. If you have questions about this medicine, talk to your doctor, pharmacist, or health care provider.  2022 Elsevier/Gold Standard (2019-03-29 15:00:03)  

## 2021-02-07 ENCOUNTER — Inpatient Hospital Stay: Payer: Medicaid Other

## 2021-02-07 ENCOUNTER — Other Ambulatory Visit: Payer: Self-pay | Admitting: Oncology

## 2021-02-07 ENCOUNTER — Other Ambulatory Visit: Payer: Self-pay

## 2021-02-07 ENCOUNTER — Inpatient Hospital Stay (INDEPENDENT_AMBULATORY_CARE_PROVIDER_SITE_OTHER): Payer: Medicaid Other | Admitting: Oncology

## 2021-02-07 ENCOUNTER — Other Ambulatory Visit: Payer: Self-pay | Admitting: Hematology and Oncology

## 2021-02-07 ENCOUNTER — Encounter: Payer: Self-pay | Admitting: Oncology

## 2021-02-07 VITALS — BP 157/81 | HR 76 | Temp 98.8°F | Resp 18 | Ht 60.0 in | Wt 183.0 lb

## 2021-02-07 DIAGNOSIS — C20 Malignant neoplasm of rectum: Secondary | ICD-10-CM

## 2021-02-07 DIAGNOSIS — D701 Agranulocytosis secondary to cancer chemotherapy: Secondary | ICD-10-CM | POA: Diagnosis not present

## 2021-02-07 DIAGNOSIS — T451X5A Adverse effect of antineoplastic and immunosuppressive drugs, initial encounter: Secondary | ICD-10-CM | POA: Insufficient documentation

## 2021-02-07 LAB — CBC
MCV: 86 (ref 81–99)
RBC: 4.2 (ref 3.87–5.11)

## 2021-02-07 LAB — BASIC METABOLIC PANEL
BUN: 10 (ref 4–21)
CO2: 29 — AB (ref 13–22)
Chloride: 100 (ref 99–108)
Creatinine: 0.7 (ref 0.5–1.1)
Glucose: 199
Potassium: 3.3 — AB (ref 3.4–5.3)
Sodium: 137 (ref 137–147)

## 2021-02-07 LAB — CBC AND DIFFERENTIAL
HCT: 36 (ref 36–46)
Hemoglobin: 12.1 (ref 12.0–16.0)
Neutrophils Absolute: 1.89
Platelets: 166 (ref 150–399)
WBC: 3.5

## 2021-02-07 LAB — HEPATIC FUNCTION PANEL
ALT: 15 (ref 7–35)
AST: 32 (ref 13–35)
Alkaline Phosphatase: 77 (ref 25–125)
Bilirubin, Total: 0.3

## 2021-02-07 LAB — COMPREHENSIVE METABOLIC PANEL
Albumin: 4.1 (ref 3.5–5.0)
Calcium: 9.1 (ref 8.7–10.7)

## 2021-02-11 MED FILL — Dexamethasone Sodium Phosphate Inj 100 MG/10ML: INTRAMUSCULAR | Qty: 1 | Status: AC

## 2021-02-11 MED FILL — Leucovorin Calcium For Inj 350 MG: INTRAMUSCULAR | Qty: 29.3 | Status: AC

## 2021-02-11 MED FILL — Fluorouracil IV Soln 2.5 GM/50ML (50 MG/ML): INTRAVENOUS | Qty: 12 | Status: AC

## 2021-02-11 MED FILL — Fluorouracil IV Soln 5 GM/100ML (50 MG/ML): INTRAVENOUS | Qty: 70 | Status: AC

## 2021-02-11 MED FILL — Oxaliplatin IV Soln 100 MG/20ML: INTRAVENOUS | Qty: 25 | Status: AC

## 2021-02-11 MED FILL — Fosaprepitant Dimeglumine For IV Infusion 150 MG (Base Eq): INTRAVENOUS | Qty: 5 | Status: AC

## 2021-02-12 ENCOUNTER — Other Ambulatory Visit: Payer: Self-pay

## 2021-02-12 ENCOUNTER — Inpatient Hospital Stay: Payer: Medicaid Other | Attending: Oncology

## 2021-02-12 VITALS — BP 138/76 | HR 88 | Temp 98.6°F | Resp 18 | Ht 60.0 in | Wt 180.1 lb

## 2021-02-12 DIAGNOSIS — Z5111 Encounter for antineoplastic chemotherapy: Secondary | ICD-10-CM | POA: Diagnosis present

## 2021-02-12 DIAGNOSIS — C20 Malignant neoplasm of rectum: Secondary | ICD-10-CM | POA: Diagnosis present

## 2021-02-12 MED ORDER — DEXTROSE 5 % IV SOLN: Freq: Once | INTRAVENOUS | Status: AC

## 2021-02-12 MED ORDER — OXALIPLATIN CHEMO INJECTION 100 MG/20ML
68.0000 mg/m2 | Freq: Once | INTRAVENOUS | Status: AC
  Administered 2021-02-12: 125 mg via INTRAVENOUS
  Filled 2021-02-12: qty 20

## 2021-02-12 MED ORDER — LEUCOVORIN CALCIUM INJECTION 350 MG
320.0000 mg/m2 | Freq: Once | INTRAVENOUS | Status: AC
  Administered 2021-02-12: 586 mg via INTRAVENOUS
  Filled 2021-02-12: qty 29.3

## 2021-02-12 MED ORDER — SODIUM CHLORIDE 0.9 % IV SOLN
10.0000 mg | Freq: Once | INTRAVENOUS | Status: AC
  Administered 2021-02-12: 10 mg via INTRAVENOUS
  Filled 2021-02-12: qty 10

## 2021-02-12 MED ORDER — SODIUM CHLORIDE 0.9 % IV SOLN
150.0000 mg | Freq: Once | INTRAVENOUS | Status: AC
  Administered 2021-02-12: 150 mg via INTRAVENOUS
  Filled 2021-02-12: qty 150

## 2021-02-12 MED ORDER — PALONOSETRON HCL INJECTION 0.25 MG/5ML
0.2500 mg | Freq: Once | INTRAVENOUS | Status: AC
  Administered 2021-02-12: 0.25 mg via INTRAVENOUS
  Filled 2021-02-12: qty 5

## 2021-02-12 MED ORDER — FLUOROURACIL CHEMO INJECTION 2.5 GM/50ML
320.0000 mg/m2 | Freq: Once | INTRAVENOUS | Status: AC
  Administered 2021-02-12: 600 mg via INTRAVENOUS
  Filled 2021-02-12: qty 12

## 2021-02-12 MED ORDER — SODIUM CHLORIDE 0.9 % IV SOLN
1920.0000 mg/m2 | INTRAVENOUS | Status: DC
  Administered 2021-02-12: 3500 mg via INTRAVENOUS
  Filled 2021-02-12: qty 70

## 2021-02-12 NOTE — Patient Instructions (Signed)
[facility] [place]  Discharge Instructions: Thank you for choosing [facility] to provide your oncology and hematology care.  If you have a lab appointment with the [facility], please go directly to the [facility] and check in at the registration area.   Wear comfortable clothing and clothing appropriate for easy access to any Portacath or PICC line.   We strive to give you quality time with your provider. You may need to reschedule your appointment if you arrive late (15 or more minutes).  Arriving late affects you and other patients whose appointments are after yours.  Also, if you miss three or more appointments without notifying the office, you may be dismissed from the clinic at the provider's discretion.      For prescription refill requests, have your pharmacy contact our office and allow 72 hours for refills to be completed.    Today you received the following chemotherapy and/or immunotherapy agents folfox   To help prevent nausea and vomiting after your treatment, we encourage you to take your nausea medication as directed.  BELOW ARE SYMPTOMS THAT SHOULD BE REPORTED IMMEDIATELY: *FEVER GREATER THAN 100.4 F (38 C) OR HIGHER *CHILLS OR SWEATING *NAUSEA AND VOMITING THAT IS NOT CONTROLLED WITH YOUR NAUSEA MEDICATION *UNUSUAL SHORTNESS OF BREATH *UNUSUAL BRUISING OR BLEEDING *URINARY PROBLEMS (pain or burning when urinating, or frequent urination) *BOWEL PROBLEMS (unusual diarrhea, constipation, pain near the anus) TENDERNESS IN MOUTH AND THROAT WITH OR WITHOUT PRESENCE OF ULCERS (sore throat, sores in mouth, or a toothache) UNUSUAL RASH, SWELLING OR PAIN  UNUSUAL VAGINAL DISCHARGE OR ITCHING   Items with * indicate a potential emergency and should be followed up as soon as possible or go to the Emergency Department if any problems should occur.  Please show the CHEMOTHERAPY ALERT CARD or IMMUNOTHERAPY ALERT CARD at check-in to the Emergency  Department and triage nurse.  Should you have questions after your visit or need to cancel or reschedule your appointment, please contact [facility]  Dept: [phone]  and follow the prompts.  Office hours are 8:00 a.m. to 4:30 p.m. Monday - Friday. Please note that voicemails left after 4:00 p.m. may not be returned until the following business day.  We are closed weekends and major holidays. You have access to a nurse at all times for urgent questions. Please call the main number to the clinic Dept: [phone] and follow the prompts.  For any non-urgent questions, you may also contact your provider using MyChart. We now offer e-Visits for anyone 66 and older to request care online for non-urgent symptoms. For details visit [email or web address].   Also download the MyChart app! Go to the app store, search "MyChart", open the app, select [facility], and log in with your MyChart username and password.  Due to Covid, a mask is required upon entering the hospital/clinic. If you do not have a mask, one will be given to you upon arrival. For doctor visits, patients may have 1 support person aged 77 or older with them. For treatment visits, patients cannot have anyone with them due to current Covid guidelines and our immunocompromised population.   Leucovorin injection What is this medication? LEUCOVORIN (loo koe VOR in) is used to prevent or treat the harmful effects of some medicines. This medicine is used to treat anemia caused by a low amount of folic acid in the body. It is also used with 5-fluorouracil (5-FU) to treat colon cancer. This medicine  may be used for other purposes; ask your health care provider or pharmacist if you have questions. What should I tell my care team before I take this medication? They need to know if you have any of these conditions: anemia from low levels of vitamin B-12 in the blood an unusual or allergic reaction to leucovorin, folic  acid, other medicines, foods, dyes, or preservatives pregnant or trying to get pregnant breast-feeding How should I use this medication? This medicine is for injection into a muscle or into a vein. It is given by a health care professional in a hospital or clinic setting. Talk to your pediatrician regarding the use of this medicine in children. Special care may be needed. Overdosage: If you think you have taken too much of this medicine contact a poison control center or emergency room at once. NOTE: This medicine is only for you. Do not share this medicine with others. What if I miss a dose? This does not apply. What may interact with this medication? capecitabine fluorouracil phenobarbital phenytoin primidone trimethoprim-sulfamethoxazole This list may not describe all possible interactions. Give your health care provider a list of all the medicines, herbs, non-prescription drugs, or dietary supplements you use. Also tell them if you smoke, drink alcohol, or use illegal drugs. Some items may interact with your medicine. What should I watch for while using this medication? Your condition will be monitored carefully while you are receiving this medicine. This medicine may increase the side effects of 5-fluorouracil, 5-FU. Tell your doctor or health care professional if you have diarrhea or mouth sores that do not get better or that get worse. What side effects may I notice from receiving this medication? Side effects that you should report to your doctor or health care professional as soon as possible: allergic reactions like skin rash, itching or hives, swelling of the face, lips, or tongue breathing problems fever, infection mouth sores unusual bleeding or bruising unusually weak or tired Side effects that usually do not require medical attention (report to your doctor or health care professional if they continue or are bothersome): constipation or diarrhea loss of appetite nausea,  vomiting This list may not describe all possible side effects. Call your doctor for medical advice about side effects. You may report side effects to FDA at 1-800-FDA-1088. Where should I keep my medication? This drug is given in a hospital or clinic and will not be stored at home. NOTE: This sheet is a summary. It may not cover all possible information. If you have questions about this medicine, talk to your doctor, pharmacist, or health care provider.  2022 Elsevier/Gold Standard (2007-11-02 16:50:29) Oxaliplatin Injection What is this medication? OXALIPLATIN (ox AL i PLA tin) is a chemotherapy drug. It targets fast dividing cells, like cancer cells, and causes these cells to die. This medicine is used to treat cancers of the colon and rectum, and many other cancers. This medicine may be used for other purposes; ask your health care provider or pharmacist if you have questions. COMMON BRAND NAME(S): Eloxatin What should I tell my care team before I take this medication? They need to know if you have any of these conditions: heart disease history of irregular heartbeat liver disease low blood counts, like white cells, platelets, or red blood cells lung or breathing disease, like asthma take medicines that treat or prevent blood clots tingling of the fingers or toes, or other nerve disorder an unusual or allergic reaction to oxaliplatin, other chemotherapy, other medicines, foods, dyes,  or preservatives pregnant or trying to get pregnant breast-feeding How should I use this medication? This drug is given as an infusion into a vein. It is administered in a hospital or clinic by a specially trained health care professional. Talk to your pediatrician regarding the use of this medicine in children. Special care may be needed. Overdosage: If you think you have taken too much of this medicine contact a poison control center or emergency room at once. NOTE: This medicine is only for you. Do not  share this medicine with others. What if I miss a dose? It is important not to miss a dose. Call your doctor or health care professional if you are unable to keep an appointment. What may interact with this medication? Do not take this medicine with any of the following medications: cisapride dronedarone pimozide thioridazine This medicine may also interact with the following medications: aspirin and aspirin-like medicines certain medicines that treat or prevent blood clots like warfarin, apixaban, dabigatran, and rivaroxaban cisplatin cyclosporine diuretics medicines for infection like acyclovir, adefovir, amphotericin B, bacitracin, cidofovir, foscarnet, ganciclovir, gentamicin, pentamidine, vancomycin NSAIDs, medicines for pain and inflammation, like ibuprofen or naproxen other medicines that prolong the QT interval (an abnormal heart rhythm) pamidronate zoledronic acid This list may not describe all possible interactions. Give your health care provider a list of all the medicines, herbs, non-prescription drugs, or dietary supplements you use. Also tell them if you smoke, drink alcohol, or use illegal drugs. Some items may interact with your medicine. What should I watch for while using this medication? Your condition will be monitored carefully while you are receiving this medicine. You may need blood work done while you are taking this medicine. This medicine may make you feel generally unwell. This is not uncommon as chemotherapy can affect healthy cells as well as cancer cells. Report any side effects. Continue your course of treatment even though you feel ill unless your healthcare professional tells you to stop. This medicine can make you more sensitive to cold. Do not drink cold drinks or use ice. Cover exposed skin before coming in contact with cold temperatures or cold objects. When out in cold weather wear warm clothing and cover your mouth and nose to warm the air that goes  into your lungs. Tell your doctor if you get sensitive to the cold. Do not become pregnant while taking this medicine or for 9 months after stopping it. Women should inform their health care professional if they wish to become pregnant or think they might be pregnant. Men should not father a child while taking this medicine and for 6 months after stopping it. There is potential for serious side effects to an unborn child. Talk to your health care professional for more information. Do not breast-feed a child while taking this medicine or for 3 months after stopping it. This medicine has caused ovarian failure in some women. This medicine may make it more difficult to get pregnant. Talk to your health care professional if you are concerned about your fertility. This medicine has caused decreased sperm counts in some men. This may make it more difficult to father a child. Talk to your health care professional if you are concerned about your fertility. This medicine may increase your risk of getting an infection. Call your health care professional for advice if you get a fever, chills, or sore throat, or other symptoms of a cold or flu. Do not treat yourself. Try to avoid being around people who are sick.  Avoid taking medicines that contain aspirin, acetaminophen, ibuprofen, naproxen, or ketoprofen unless instructed by your health care professional. These medicines may hide a fever. Be careful brushing or flossing your teeth or using a toothpick because you may get an infection or bleed more easily. If you have any dental work done, tell your dentist you are receiving this medicine. What side effects may I notice from receiving this medication? Side effects that you should report to your doctor or health care professional as soon as possible: allergic reactions like skin rash, itching or hives, swelling of the face, lips, or tongue breathing problems cough low blood counts - this medicine may decrease the  number of white blood cells, red blood cells, and platelets. You may be at increased risk for infections and bleeding nausea, vomiting pain, redness, or irritation at site where injected pain, tingling, numbness in the hands or feet signs and symptoms of bleeding such as bloody or black, tarry stools; red or dark brown urine; spitting up blood or brown material that looks like coffee grounds; red spots on the skin; unusual bruising or bleeding from the eyes, gums, or nose signs and symptoms of a dangerous change in heartbeat or heart rhythm like chest pain; dizziness; fast, irregular heartbeat; palpitations; feeling faint or lightheaded; falls signs and symptoms of infection like fever; chills; cough; sore throat; pain or trouble passing urine signs and symptoms of liver injury like dark yellow or brown urine; general ill feeling or flu-like symptoms; light-colored stools; loss of appetite; nausea; right upper belly pain; unusually weak or tired; yellowing of the eyes or skin signs and symptoms of low red blood cells or anemia such as unusually weak or tired; feeling faint or lightheaded; falls signs and symptoms of muscle injury like dark urine; trouble passing urine or change in the amount of urine; unusually weak or tired; muscle pain; back pain Side effects that usually do not require medical attention (report to your doctor or health care professional if they continue or are bothersome): changes in taste diarrhea gas hair loss loss of appetite mouth sores This list may not describe all possible side effects. Call your doctor for medical advice about side effects. You may report side effects to FDA at 1-800-FDA-1088. Where should I keep my medication? This drug is given in a hospital or clinic and will not be stored at home. NOTE: This sheet is a summary. It may not cover all possible information. If you have questions about this medicine, talk to your doctor, pharmacist, or health care  provider.  2022 Elsevier/Gold Standard (2018-09-15 12:20:35) Fluorouracil, 5-FU injection What is this medication? FLUOROURACIL, 5-FU (flure oh YOOR a sil) is a chemotherapy drug. It slows the growth of cancer cells. This medicine is used to treat many types of cancer like breast cancer, colon or rectal cancer, pancreatic cancer, and stomach cancer. This medicine may be used for other purposes; ask your health care provider or pharmacist if you have questions. COMMON BRAND NAME(S): Adrucil What should I tell my care team before I take this medication? They need to know if you have any of these conditions: blood disorders dihydropyrimidine dehydrogenase (DPD) deficiency infection (especially a virus infection such as chickenpox, cold sores, or herpes) kidney disease liver disease malnourished, poor nutrition recent or ongoing radiation therapy an unusual or allergic reaction to fluorouracil, other chemotherapy, other medicines, foods, dyes, or preservatives pregnant or trying to get pregnant breast-feeding How should I use this medication? This drug is given as an  infusion or injection into a vein. It is administered in a hospital or clinic by a specially trained health care professional. Talk to your pediatrician regarding the use of this medicine in children. Special care may be needed. Overdosage: If you think you have taken too much of this medicine contact a poison control center or emergency room at once. NOTE: This medicine is only for you. Do not share this medicine with others. What if I miss a dose? It is important not to miss your dose. Call your doctor or health care professional if you are unable to keep an appointment. What may interact with this medication? Do not take this medicine with any of the following medications: live virus vaccines This medicine may also interact with the following medications: medicines that treat or prevent blood clots like warfarin,  enoxaparin, and dalteparin This list may not describe all possible interactions. Give your health care provider a list of all the medicines, herbs, non-prescription drugs, or dietary supplements you use. Also tell them if you smoke, drink alcohol, or use illegal drugs. Some items may interact with your medicine. What should I watch for while using this medication? Visit your doctor for checks on your progress. This drug may make you feel generally unwell. This is not uncommon, as chemotherapy can affect healthy cells as well as cancer cells. Report any side effects. Continue your course of treatment even though you feel ill unless your doctor tells you to stop. In some cases, you may be given additional medicines to help with side effects. Follow all directions for their use. Call your doctor or health care professional for advice if you get a fever, chills or sore throat, or other symptoms of a cold or flu. Do not treat yourself. This drug decreases your body's ability to fight infections. Try to avoid being around people who are sick. This medicine may increase your risk to bruise or bleed. Call your doctor or health care professional if you notice any unusual bleeding. Be careful brushing and flossing your teeth or using a toothpick because you may get an infection or bleed more easily. If you have any dental work done, tell your dentist you are receiving this medicine. Avoid taking products that contain aspirin, acetaminophen, ibuprofen, naproxen, or ketoprofen unless instructed by your doctor. These medicines may hide a fever. Do not become pregnant while taking this medicine. Women should inform their doctor if they wish to become pregnant or think they might be pregnant. There is a potential for serious side effects to an unborn child. Talk to your health care professional or pharmacist for more information. Do not breast-feed an infant while taking this medicine. Men should inform their doctor if  they wish to father a child. This medicine may lower sperm counts. Do not treat diarrhea with over the counter products. Contact your doctor if you have diarrhea that lasts more than 2 days or if it is severe and watery. This medicine can make you more sensitive to the sun. Keep out of the sun. If you cannot avoid being in the sun, wear protective clothing and use sunscreen. Do not use sun lamps or tanning beds/booths. What side effects may I notice from receiving this medication? Side effects that you should report to your doctor or health care professional as soon as possible: allergic reactions like skin rash, itching or hives, swelling of the face, lips, or tongue low blood counts - this medicine may decrease the number of white blood cells, red  blood cells and platelets. You may be at increased risk for infections and bleeding. signs of infection - fever or chills, cough, sore throat, pain or difficulty passing urine signs of decreased platelets or bleeding - bruising, pinpoint red spots on the skin, black, tarry stools, blood in the urine signs of decreased red blood cells - unusually weak or tired, fainting spells, lightheadedness breathing problems changes in vision chest pain mouth sores nausea and vomiting pain, swelling, redness at site where injected pain, tingling, numbness in the hands or feet redness, swelling, or sores on hands or feet stomach pain unusual bleeding Side effects that usually do not require medical attention (report to your doctor or health care professional if they continue or are bothersome): changes in finger or toe nails diarrhea dry or itchy skin hair loss headache loss of appetite sensitivity of eyes to the light stomach upset unusually teary eyes This list may not describe all possible side effects. Call your doctor for medical advice about side effects. You may report side effects to FDA at 1-800-FDA-1088. Where should I keep my medication? This  drug is given in a hospital or clinic and will not be stored at home. NOTE: This sheet is a summary. It may not cover all possible information. If you have questions about this medicine, talk to your doctor, pharmacist, or health care provider.  2022 Elsevier/Gold Standard (2019-03-29 15:00:03)

## 2021-02-13 ENCOUNTER — Encounter: Payer: Self-pay | Admitting: Oncology

## 2021-02-14 ENCOUNTER — Other Ambulatory Visit: Payer: Self-pay

## 2021-02-14 ENCOUNTER — Inpatient Hospital Stay: Payer: Medicaid Other

## 2021-02-14 VITALS — BP 146/70 | HR 83 | Temp 99.0°F | Resp 18 | Ht 60.0 in | Wt 181.1 lb

## 2021-02-14 DIAGNOSIS — Z5111 Encounter for antineoplastic chemotherapy: Secondary | ICD-10-CM | POA: Diagnosis not present

## 2021-02-14 DIAGNOSIS — C20 Malignant neoplasm of rectum: Secondary | ICD-10-CM

## 2021-02-14 MED ORDER — HEPARIN SOD (PORK) LOCK FLUSH 100 UNIT/ML IV SOLN
500.0000 [IU] | Freq: Once | INTRAVENOUS | Status: AC | PRN
  Administered 2021-02-14: 500 [IU]

## 2021-02-14 MED ORDER — SODIUM CHLORIDE 0.9% FLUSH
10.0000 mL | INTRAVENOUS | Status: DC | PRN
  Administered 2021-02-14: 10 mL

## 2021-02-14 NOTE — Patient Instructions (Signed)
Fluorouracil, 5-FU injection What is this medication? FLUOROURACIL, 5-FU (flure oh YOOR a sil) is a chemotherapy drug. It slows the growth of cancer cells. This medicine is used to treat many types of cancer like breast cancer, colon or rectal cancer, pancreatic cancer, and stomach cancer. This medicine may be used for other purposes; ask your health care provider or pharmacist if you have questions. COMMON BRAND NAME(S): Adrucil What should I tell my care team before I take this medication? They need to know if you have any of these conditions: blood disorders dihydropyrimidine dehydrogenase (DPD) deficiency infection (especially a virus infection such as chickenpox, cold sores, or herpes) kidney disease liver disease malnourished, poor nutrition recent or ongoing radiation therapy an unusual or allergic reaction to fluorouracil, other chemotherapy, other medicines, foods, dyes, or preservatives pregnant or trying to get pregnant breast-feeding How should I use this medication? This drug is given as an infusion or injection into a vein. It is administered in a hospital or clinic by a specially trained health care professional. Talk to your pediatrician regarding the use of this medicine in children. Special care may be needed. Overdosage: If you think you have taken too much of this medicine contact a poison control center or emergency room at once. NOTE: This medicine is only for you. Do not share this medicine with others. What if I miss a dose? It is important not to miss your dose. Call your doctor or health care professional if you are unable to keep an appointment. What may interact with this medication? Do not take this medicine with any of the following medications: live virus vaccines This medicine may also interact with the following medications: medicines that treat or prevent blood clots like warfarin, enoxaparin, and dalteparin This list may not describe all possible  interactions. Give your health care provider a list of all the medicines, herbs, non-prescription drugs, or dietary supplements you use. Also tell them if you smoke, drink alcohol, or use illegal drugs. Some items may interact with your medicine. What should I watch for while using this medication? Visit your doctor for checks on your progress. This drug may make you feel generally unwell. This is not uncommon, as chemotherapy can affect healthy cells as well as cancer cells. Report any side effects. Continue your course of treatment even though you feel ill unless your doctor tells you to stop. In some cases, you may be given additional medicines to help with side effects. Follow all directions for their use. Call your doctor or health care professional for advice if you get a fever, chills or sore throat, or other symptoms of a cold or flu. Do not treat yourself. This drug decreases your body's ability to fight infections. Try to avoid being around people who are sick. This medicine may increase your risk to bruise or bleed. Call your doctor or health care professional if you notice any unusual bleeding. Be careful brushing and flossing your teeth or using a toothpick because you may get an infection or bleed more easily. If you have any dental work done, tell your dentist you are receiving this medicine. Avoid taking products that contain aspirin, acetaminophen, ibuprofen, naproxen, or ketoprofen unless instructed by your doctor. These medicines may hide a fever. Do not become pregnant while taking this medicine. Women should inform their doctor if they wish to become pregnant or think they might be pregnant. There is a potential for serious side effects to an unborn child. Talk to your health care   professional or pharmacist for more information. Do not breast-feed an infant while taking this medicine. Men should inform their doctor if they wish to father a child. This medicine may lower sperm  counts. Do not treat diarrhea with over the counter products. Contact your doctor if you have diarrhea that lasts more than 2 days or if it is severe and watery. This medicine can make you more sensitive to the sun. Keep out of the sun. If you cannot avoid being in the sun, wear protective clothing and use sunscreen. Do not use sun lamps or tanning beds/booths. What side effects may I notice from receiving this medication? Side effects that you should report to your doctor or health care professional as soon as possible: allergic reactions like skin rash, itching or hives, swelling of the face, lips, or tongue low blood counts - this medicine may decrease the number of white blood cells, red blood cells and platelets. You may be at increased risk for infections and bleeding. signs of infection - fever or chills, cough, sore throat, pain or difficulty passing urine signs of decreased platelets or bleeding - bruising, pinpoint red spots on the skin, black, tarry stools, blood in the urine signs of decreased red blood cells - unusually weak or tired, fainting spells, lightheadedness breathing problems changes in vision chest pain mouth sores nausea and vomiting pain, swelling, redness at site where injected pain, tingling, numbness in the hands or feet redness, swelling, or sores on hands or feet stomach pain unusual bleeding Side effects that usually do not require medical attention (report to your doctor or health care professional if they continue or are bothersome): changes in finger or toe nails diarrhea dry or itchy skin hair loss headache loss of appetite sensitivity of eyes to the light stomach upset unusually teary eyes This list may not describe all possible side effects. Call your doctor for medical advice about side effects. You may report side effects to FDA at 1-800-FDA-1088. Where should I keep my medication? This drug is given in a hospital or clinic and will not be  stored at home. NOTE: This sheet is a summary. It may not cover all possible information. If you have questions about this medicine, talk to your doctor, pharmacist, or health care provider.  2022 Elsevier/Gold Standard (2019-03-29 15:00:03)  

## 2021-02-22 ENCOUNTER — Encounter: Payer: Self-pay | Admitting: Oncology

## 2021-03-04 ENCOUNTER — Encounter: Payer: Self-pay | Admitting: Oncology

## 2021-03-04 NOTE — Progress Notes (Incomplete)
[facility]  [address] [place],  [place]  [address] [phone]  Clinic Day:  02/07/2021  Referring physician: Garnetta Buddy I, NP  This document serves as a record of services personally performed by Hosie Poisson, MD. It was created on their behalf by Curry,Lauren E, a trained medical scribe. The creation of this record is based on the scribe's personal observations and the provider's statements to them.  ASSESSMENT & PLAN:   Assessment: 1.  Clinical stage IIIB ([id number]) rectal cancer, based on CT reveal any several small perirectal and presacral lymph nodes, which were highly concerning for metastatic involvement. She underwent abdominoperitoneal resection in April and lymph nodes were negative.   She is receiving adjuvant FOLFOX chemotherapy with plans for 8 cycles.     Plan: She recently completed 8 cycles of FOLFOX in early October. We will plan to see her back in 3 months with CBC, CMP, and CEA for repeat evaluation. At that point, she will be due for port flush. She verbalizes understanding of and agreement to the plans discussed today. She knows to call the office should any new questions or concerns arise.   I provided 15 minutes of face-to-face time during this this encounter and > 50% was spent counseling as documented under my assessment and plan.    [facility] [address] [place] [address] Dept: [phone] Dept Fax: [phone]   No orders of the defined types were placed in this encounter.   CHIEF COMPLAINT:  CC:  Stage IIIB rectal cancer   Current Treatment:  FOLFOX   HISTORY OF PRESENT ILLNESS:   Oncology History  Rectal cancer ([place])  04/20/2020 Procedure   COLONOSCOPY: Rectal mass beginning at about 2 cm from the anal verge and was noted to obstruct 50% of the circumference of the rectum.  Benign polyps were also present and removed.    04/20/2020 Pathology Results   Rectum, biopsy:  -  Invasive adenocarcinoma    04/22/2020 Cancer Staging   Staging form: Colon and Rectum, AJCC 8th Edition - Clinical stage from 04/22/2020: Stage IIIB (cT3, cN1b, cM0) - Signed by Derwood Kaplan, MD on 06/04/2020   04/23/2020 Initial Diagnosis   Rectal cancer ([facility])   04/24/2020 Imaging   CT ABDOMEN AND PELVIS WITH CONTRAST: 1. Known rectal lesion not well demonstrated by CT. There are several small perirectal and presacral lymph nodes measuring up to 8 mm short axis. These are highly concerning for metastatic involvement. 2. Right middle lobe pulmonary nodules, stable 2016 and consistent with benign etiology. 3. 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 4. Aortic Atherosclerosis (ICD10-I70.0).   05/17/2020 - 06/26/2020 Radiation Therapy   1. Pelvis:  4500 cGy in 25 fractions 2. Rectal boost:  540 cGy in 3 fractions   05/21/2020 - 06/22/2020 Chemotherapy   Concurrent chemoradiation with infusional 5 FU        08/22/2020 Pathology Results   A. COLON, SIGMOID, RECTUM, ANUS, RESECTION:  - Invasive colorectal adenocarcinoma, 3 cm.  - Carcinoma extends into perirectal connective tissue.  - Nineteen benign lymph nodes (0/19).  - Margins not involved.    08/31/2020 Cancer Staging   Staging form: Colon and Rectum, AJCC 8th Edition - Pathologic stage from 08/31/2020: Stage IIA (ypT3, pN0, cM0) - Signed by Derwood Kaplan, MD on 01/14/2021 Histopathologic type: Adenocarcinoma, NOS Stage prefix: Post-therapy Response to neoadjuvant therapy: Partial response Total positive nodes:  0 Total nodes examined: 19 Histologic grading system: 4 grade system Histologic grade (G): G2 Residual tumor (R): R0 - None Tumor size (mm): 30 Lymph-vascular invasion (LVI): LVI not present (absent)/not identified Diagnostic confirmation: Positive histology PLUS positive immunophenotyping and/or positive genetic  studies Specimen type: Excision Staged by: Managing physician Tumor deposits (TD): Absent Perineural invasion (PNI): Absent Stage used in treatment planning: Yes National guidelines used in treatment planning: Yes Type of national guideline used in treatment planning: NCCN Staging comments: Rec FOLFOX chemotherapy x 4 months   10/09/2020 -  Chemotherapy    Patient is on Treatment Plan: COLORECTAL FOLFOX Q14D X 4 MONTHS       Hypokalemia     INTERVAL HISTORY:  Fredrika is here for routine follow up prior to an 8th and final cycle of FOLFOX. She states that she is doing well and denies complaints other than intermittent loose bowels. White count has increased from 3.0 to 3.5 with an ANC of 1890, and hemoglobin and platelets are normal. Chemistries are unremarkable except for a potassium of 3.3. Non-fasting blood glucose was 199. She recently resumed oral supplement 20 meq daily, and I advised that she increase this to BID. Her  appetite is good, and she has gained 3 and 1/2 pounds since her last visit.  She denies fever, chills or other signs of infection.  She denies nausea, vomiting, bowel issues, or abdominal pain.  She denies sore throat, cough, dyspnea, or chest pain.  Jaleisa is here for routine follow up ***.   This document serves as a record of services personally performed by Hosie Poisson, MD. It was created on their behalf by Curry,Lauren E, a trained medical scribe. The creation of this record is based on the scribe's personal observations and the provider's statements to them.  REVIEW OF SYSTEMS:  Review of Systems  Constitutional: Negative.  Negative for appetite change, chills, fatigue, fever and unexpected weight change.  HENT:  Negative.    Eyes: Negative.   Respiratory: Negative.  Negative for chest tightness, cough, hemoptysis, shortness of breath and wheezing.   Cardiovascular: Negative.  Negative for chest pain, leg swelling and palpitations.  Gastrointestinal:  Negative.  Negative for abdominal distention, abdominal pain, blood in stool, constipation, diarrhea, nausea and vomiting.  Endocrine: Negative.   Genitourinary: Negative.  Negative for difficulty urinating, dysuria, frequency and hematuria.   Musculoskeletal: Negative.  Negative for arthralgias, back pain, flank pain, gait problem and myalgias.  Skin: Negative.   Neurological: Negative.  Negative for dizziness, extremity weakness, gait problem, headaches, light-headedness, numbness, seizures and speech difficulty.  Hematological: Negative.   Psychiatric/Behavioral: Negative.  Negative for depression and sleep disturbance. The patient is not nervous/anxious.     VITALS:  Blood pressure (!) 157/81, pulse 76, temperature 98.8 F (37.1 C), temperature source Oral, resp. rate 18, height 5' (1.524 m), weight 183 lb (83 kg), SpO2 98 %.  Wt Readings from Last 3 Encounters:  02/07/21 183 lb (83 kg)  01/31/21 179 lb (81.2 kg)  01/29/21 180 lb (81.6 kg)    Body mass index is 35.74 kg/m.  Performance status (ECOG): 0 - Asymptomatic  PHYSICAL EXAM:  Physical Exam Constitutional:      General: She is not in acute distress.    Appearance: Normal appearance. She is normal weight.  HENT:     Head: Normocephalic and atraumatic.  Eyes:     General: No scleral icterus.    Extraocular Movements: Extraocular movements intact.     Conjunctiva/sclera: Conjunctivae  normal.     Pupils: Pupils are equal, round, and reactive to light.  Cardiovascular:     Rate and Rhythm: Normal rate and regular rhythm.     Pulses: Normal pulses.     Heart sounds: Normal heart sounds. No murmur heard.   No friction rub. No gallop.  Pulmonary:     Effort: Pulmonary effort is normal. No respiratory distress.     Breath sounds: Normal breath sounds.  Abdominal:     General: Bowel sounds are normal. There is no distension.     Palpations: Abdomen is soft. There is no hepatomegaly, splenomegaly or mass.     Tenderness:  There is no abdominal tenderness.  Musculoskeletal:        General: Normal range of motion.     Cervical back: Normal range of motion and neck supple.     Right lower leg: No edema.     Left lower leg: No edema.  Lymphadenopathy:     Cervical: No cervical adenopathy.  Skin:    General: Skin is warm and dry.  Neurological:     General: No focal deficit present.     Mental Status: She is alert and oriented to person, place, and time. Mental status is at baseline.  Psychiatric:        Mood and Affect: Mood normal.        Behavior: Behavior normal.        Thought Content: Thought content normal.        Judgment: Judgment normal.    LABS:   CBC Latest Ref Rng & Units 02/07/2021 01/24/2021 01/10/2021  WBC - 3.5 3.0 3.5  Hemoglobin 12.0 - 16.0 12.1 12.0 11.8(A)  Hematocrit 36 - 46 36 36 36  Platelets 150 - 399 166 184 202   CMP Latest Ref Rng & Units 02/07/2021 01/24/2021 01/10/2021  Glucose 70 - 99 mg/dL - - -  BUN 4 - $R'21 10 12 12  'Ro$ Creatinine 0.5 - 1.1 0.7 0.6 0.6  Sodium 137 - 147 137 136(A) 136(A)  Potassium 3.4 - 5.3 3.3(A) 3.9 3.3(A)  Chloride 99 - 108 100 100 102  CO2 13 - 22 29(A) 27(A) 27(A)  Calcium 8.7 - 10.7 9.1 9.3 9.1  Total Protein 6.3 - 8.2 g/dL - - -  Alkaline Phos 25 - 125 77 77 66  AST 13 - 35 32 35 31  ALT 7 - 35 $Re'15 16 13     'TbP$ Lab Results  Component Value Date   CEA1 1.1 10/01/2020   /  CEA  Date Value Ref Range Status  10/01/2020 1.1 0.0 - 4.7 ng/mL Final    Comment:    (NOTE)                             Nonsmokers          <3.9                             Smokers             <5.6 [employer] Electrochemiluminescence Immunoassay (ECLIA) Values obtained with different assay methods or kits cannot be used interchangeably.  Results cannot be interpreted as absolute evidence of the presence or absence of malignant disease. Performed At: [facility] [place], [place] [id number] Rush Farmer MD PH:[phone]       STUDIES:  No results found.  HISTORY:   Allergies:  Allergies  Allergen Reactions   Penicillins Rash    Current Medications: Current Outpatient Medications  Medication Sig Dispense Refill   Accu-Chek Softclix Lancets lancets USE TO CHECK BLOOD GLUCOSE TWICE DAILY.     albuterol (VENTOLIN HFA) 108 (90 Base) MCG/ACT inhaler Inhale 2 puffs into the lungs every 6 (six) hours as needed for shortness of breath or wheezing.     aspirin 81 MG EC tablet Take 81 mg by mouth daily. (Patient not taking: No sig reported)     atorvastatin (LIPITOR) 40 MG tablet Take 40 mg by mouth daily.     Blood Glucose Monitoring Suppl (GLUCOCOM BLOOD GLUCOSE MONITOR) DEVI 1 each by Misc.(Non-Drug; Combo Route) route daily.     diphenoxylate-atropine (LOMOTIL) 2.5-0.025 MG tablet Take 2 tablets by mouth 4 (four) times daily as needed for diarrhea or loose stools. 60 tablet 0   furosemide (LASIX) 20 MG tablet Take 20 mg by mouth daily.     gabapentin (NEURONTIN) 300 MG capsule Take 300 mg by mouth 2 (two) times daily.     glucose blood (ACCU-CHEK AVIVA PLUS) test strip Check blood glucose twice daily and as needed. Dx E 11.65     HYDROcodone-acetaminophen (NORCO) 7.5-325 MG tablet Take 1 tablet by mouth every 6 (six) hours as needed for moderate pain. 60 tablet 0   Lancets Misc. (ACCU-CHEK FASTCLIX LANCET) KIT Check blood glucose twice daily. Dx E 11.65     levothyroxine (SYNTHROID) 100 MCG tablet Take 100 mcg by mouth daily before breakfast.     metFORMIN (GLUCOPHAGE-XR) 500 MG 24 hr tablet Take 500 mg by mouth 2 (two) times daily with a meal.     montelukast (SINGULAIR) 10 MG tablet Take 10 mg by mouth daily.     ondansetron (ZOFRAN) 4 MG tablet Take 1 tablet (4 mg total) by mouth every 4 (four) hours as needed for nausea. 90 tablet 3   ondansetron (ZOFRAN) 8 MG tablet Take 1 tablet (8 mg total) by mouth every 8 (eight) hours as needed for nausea or vomiting. 20 tablet 1   pantoprazole (PROTONIX) 40 MG  tablet Take 40 mg by mouth 2 (two) times daily.     potassium chloride SA (KLOR-CON) 20 MEQ tablet Take 1 tablet (20 mEq total) by mouth 2 (two) times daily. 60 tablet 3   prochlorperazine (COMPAZINE) 10 MG tablet Take 1 tablet (10 mg total) by mouth every 6 (six) hours as needed for nausea or vomiting. 90 tablet 3   prochlorperazine (COMPAZINE) 10 MG tablet Take 1 tablet (10 mg total) by mouth every 6 (six) hours as needed for nausea or vomiting. 90 tablet 3   Current Facility-Administered Medications  Medication Dose Route Frequency Provider Last Rate Last Admin   triamcinolone acetonide (KENALOG) 10 MG/ML injection 10 mg  10 mg Other Once Landis Martins, DPM       triamcinolone acetonide (KENALOG-40) injection 20 mg  20 mg Other Once Landis Martins, DPM        I, Rita Ohara, am acting as scribe for Derwood Kaplan, MD  I have reviewed this report as typed by the medical scribe, and it is complete and accurate.

## 2021-03-08 ENCOUNTER — Ambulatory Visit: Payer: Medicaid Other | Admitting: Hematology and Oncology

## 2021-03-08 ENCOUNTER — Inpatient Hospital Stay: Payer: Medicaid Other

## 2021-03-08 NOTE — Assessment & Plan Note (Deleted)
Clinical stage IIIB rectal cancer, based on CT reveal any several small perirectal and presacral lymph nodes, which were highly concerning for metastatic involvement. She underwent abdominoperitonealresection in April and lymph nodes were negative. She completed adjuvant chemotherapy with FOLFOX earlier this month.  She has recovered well.  She remains without evidence of recurrence.  We will plan lto see her back in 3 months with a CBC, comprehensive metabolic panel and CEA for repeat clinical assessment.

## 2021-03-08 NOTE — Progress Notes (Deleted)
[place]  [address] [place],  [place]  [address] [phone]  Clinic Day:  03/08/2021  Referring physician: Garnetta Buddy I, NP  ASSESSMENT & PLAN:   Assessment & Plan: Rectal cancer [facility]) Clinical stage IIIB rectal cancer, based on CT reveal any several small perirectal and presacral lymph nodes, which were highly concerning for metastatic involvement. She underwent abdominoperitoneal resection in April and lymph nodes were negative.   She completed adjuvant chemotherapy with FOLFOX earlier this month.  She has recovered well.  She remains without evidence of recurrence.  We will plan lto see her back in 3 months with a CBC, comprehensive metabolic panel and CEA for repeat clinical assessment.     The patient understands the plans discussed today and is in agreement with them.  She knows to contact our office if she develops concerns prior to her next appointment.   I provided *** minutes of face-to-face time during this encounter and > 50% was spent counseling as documented under my assessment and plan.    Tanya Pickles, PA-C  [facility] AT [facility] [address] [place] [address] Dept: [phone] Dept Fax: [phone]   No orders of the defined types were placed in this encounter.     CHIEF COMPLAINT:  CC:  Stage IIIB colon cancer  Current Treatment:   Observation   HISTORY OF PRESENT ILLNESS:   Oncology History  Rectal cancer ([place])  04/20/2020 Procedure   COLONOSCOPY: Rectal mass beginning at about 2 cm from the anal verge and was noted to obstruct 50% of the circumference of the rectum.  Benign polyps were also present and removed.   04/20/2020 Pathology Results   Rectum, biopsy:  -  Invasive adenocarcinoma    04/22/2020 Cancer Staging   Staging form: Colon and Rectum, AJCC 8th Edition - Clinical stage from 04/22/2020: Stage IIIB (cT3, cN1b, cM0) - Signed by  Derwood Kaplan, MD on 06/04/2020    04/23/2020 Initial Diagnosis   Rectal cancer ([place])   04/24/2020 Imaging   CT ABDOMEN AND PELVIS WITH CONTRAST: 1. Known rectal lesion not well demonstrated by CT. There are several small perirectal and presacral lymph nodes measuring up to 8 mm short axis. These are highly concerning for metastatic involvement. 2. Right middle lobe pulmonary nodules, stable 2016 and consistent with benign etiology. 3. 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 4. Aortic Atherosclerosis (ICD10-I70.0).   05/17/2020 - 06/26/2020 Radiation Therapy   1. Pelvis:  4500 cGy in 25 fractions 2. Rectal boost:  540 cGy in 3 fractions   05/21/2020 - 06/22/2020 Chemotherapy   Concurrent chemoradiation with infusional 5 FU        08/22/2020 Pathology Results   A. COLON, SIGMOID, RECTUM, ANUS, RESECTION:  - Invasive colorectal adenocarcinoma, 3 cm.  - Carcinoma extends into perirectal connective tissue.  - Nineteen benign lymph nodes (0/19).  - Margins not involved.    08/31/2020 Cancer Staging   Staging form: Colon and Rectum, AJCC 8th Edition - Pathologic stage from 08/31/2020: Stage IIA (ypT3, pN0, cM0) - Signed by Derwood Kaplan, MD on 01/14/2021 Histopathologic type: Adenocarcinoma, NOS Stage prefix: Post-therapy Response to neoadjuvant therapy: Partial response Total positive nodes: 0 Total nodes examined: 19 Histologic grading system: 4 grade system Histologic grade (G): G2 Residual tumor (R): R0 - None Tumor size (mm): 30 Lymph-vascular invasion (LVI): LVI not present (absent)/not identified Diagnostic confirmation: Positive histology PLUS  positive immunophenotyping and/or positive genetic studies Specimen type: Excision Staged by: Managing physician Tumor deposits (TD): Absent Perineural invasion (PNI): Absent Stage used in treatment planning: Yes National guidelines used in treatment planning: Yes Type of national guideline  used in treatment planning: NCCN Staging comments: Rec FOLFOX chemotherapy x 4 months    10/09/2020 - 02/14/2021 Chemotherapy   Patient is on Treatment Plan : COLORECTAL FOLFOX q14d x 4 months     Hypokalemia     INTERVAL HISTORY:  Tanya Mcclure is here today for repeat clinical assessment. She denies fevers or chills. She denies pain. Her appetite is good. Her weight {Weight change:10426}.  REVIEW OF SYSTEMS:  Review of Systems  Constitutional:  Negative for appetite change, chills, fatigue, fever and unexpected weight change.  HENT:   Negative for lump/mass, mouth sores and sore throat.   Respiratory:  Negative for cough and shortness of breath.   Cardiovascular:  Negative for chest pain and leg swelling.  Gastrointestinal:  Negative for abdominal pain, constipation, diarrhea, nausea and vomiting.  Endocrine: Negative for hot flashes.  Genitourinary:  Negative for difficulty urinating, dysuria, frequency and hematuria.   Musculoskeletal:  Negative for arthralgias, back pain and myalgias.  Skin:  Negative for rash.  Neurological:  Negative for dizziness and headaches.  Hematological:  Negative for adenopathy. Does not bruise/bleed easily.  Psychiatric/Behavioral:  Negative for depression and sleep disturbance. The patient is not nervous/anxious.     VITALS:  There were no vitals taken for this visit.  Wt Readings from Last 3 Encounters:  02/14/21 181 lb 1.9 oz (82.2 kg)  02/12/21 180 lb 1.3 oz (81.7 kg)  02/07/21 183 lb (83 kg)    There is no height or weight on file to calculate BMI.  Performance status (ECOG): {CHL ONC Q3448304  PHYSICAL EXAM:  Physical Exam Vitals and nursing note reviewed.  Constitutional:      General: She is not in acute distress.    Appearance: Normal appearance.  HENT:     Head: Normocephalic and atraumatic.     Mouth/Throat:     Mouth: Mucous membranes are moist.     Pharynx: Oropharynx is clear. No oropharyngeal exudate or posterior  oropharyngeal erythema.  Eyes:     General: No scleral icterus.    Extraocular Movements: Extraocular movements intact.     Conjunctiva/sclera: Conjunctivae normal.     Pupils: Pupils are equal, round, and reactive to light.  Cardiovascular:     Rate and Rhythm: Normal rate and regular rhythm.     Heart sounds: Normal heart sounds. No murmur heard.   No friction rub. No gallop.  Pulmonary:     Effort: Pulmonary effort is normal.     Breath sounds: Normal breath sounds. No wheezing, rhonchi or rales.  Abdominal:     General: There is no distension.     Palpations: Abdomen is soft. There is no hepatomegaly, splenomegaly or mass.     Tenderness: There is no abdominal tenderness.  Musculoskeletal:        General: Normal range of motion.     Cervical back: Normal range of motion and neck supple. No tenderness.     Right lower leg: No edema.     Left lower leg: No edema.  Lymphadenopathy:     Cervical: No cervical adenopathy.     Upper Body:     Right upper body: No supraclavicular or axillary adenopathy.     Left upper body: No supraclavicular or axillary adenopathy.  Lower Body: No right inguinal adenopathy. No left inguinal adenopathy.  Skin:    General: Skin is warm and dry.     Coloration: Skin is not jaundiced.     Findings: No rash.  Neurological:     Mental Status: She is alert and oriented to person, place, and time.     Cranial Nerves: No cranial nerve deficit.  Psychiatric:        Mood and Affect: Mood normal.        Behavior: Behavior normal.        Thought Content: Thought content normal.    LABS:   CBC Latest Ref Rng & Units 02/07/2021 01/24/2021 01/10/2021  WBC - 3.5 3.0 3.5  Hemoglobin 12.0 - 16.0 12.1 12.0 11.8(A)  Hematocrit 36 - 46 36 36 36  Platelets 150 - 399 166 184 202   CMP Latest Ref Rng & Units 02/07/2021 01/24/2021 01/10/2021  Glucose 70 - 99 mg/dL - - -  BUN 4 - _0 Creatinine 0.5 - 1.1 0.7 0.6 0.6  Sodium 137 - 147 137 136(A) 136(A)   Potassium 3.4 - 5.3 3.3(A) 3.9 3.3(A)  Chloride 99 - 108 100 100 102  CO2 13 - 22 29(A) 27(A) 27(A)  Calcium 8.7 - 10.7 9.1 9.3 9.1  Total Protein 6.3 - 8.2 g/dL - - -  Alkaline Phos 25 - 125 77 77 66  AST 13 - 35 32 35 31  ALT 7 - 35 _1 Lab Results  Component Value Date   CEA1 1.1 10/01/2020   /  CEA  Date Value Ref Range Status  10/01/2020 1.1 0.0 - 4.7 ng/mL Final    Comment:    (NOTE)                             Nonsmokers          <3.9                             Smokers             <5.6 Roche [employer] Electrochemiluminescence Immunoassay (ECLIA) Values obtained with different assay methods or kits cannot be used interchangeably.  Results cannot be interpreted as absolute evidence of the presence or absence of malignant disease. Performed At: [facility] [place], [place] [id number] Rush Farmer MD JK:[id number]    No results found for: PSA1 No results found for: CAN199 No results found for: CAN125  No results found for: TOTALPROTELP, ALBUMINELP, A1GS, A2GS, BETS, BETA2SER, GAMS, MSPIKE, SPEI No results found for: TIBC, FERRITIN, IRONPCTSAT No results found for: LDH  STUDIES:  No results found.    HISTORY:   Past Medical History:  Diagnosis Date   Arthritis    Asthma    Cancer ([place]) 04/2020   colon   Diabetes mellitus without complication ([place])    Dyspnea    walking,   Family history of adverse reaction to anesthesia    daughter PONV   GERD (gastroesophageal reflux disease)    Hypertension    Hypothyroidism    Thyroid disease     Past Surgical History:  Procedure Laterality Date   ABDOMINAL HYSTERECTOMY     due to fibroids   CHOLECYSTECTOMY     HYSTERECTOMY ABDOMINAL WITH SALPINGO-OOPHORECTOMY     POLYPECTOMY     PORTA CATH INSERTION  TUBAL LIGATION     XI ROBOTIC ASSISTED LOWER ANTERIOR RESECTION N/A 08/22/2020   Procedure: XI ROBOTIC ABDOMINAL PERIITONEAL RESECTION;  Surgeon: Leighton Ruff, MD;   Location: [facility];  Service: General;  Laterality: N/A;  4 HOURS    Family History  Problem Relation Age of Onset   Diabetes Mother    Hypertension Mother    Heart disease Mother    Heart attack Mother    Diabetes Father    Hypertension Father    Diabetes Sister    COPD Sister     Social History:  reports that she has never smoked. She has never used smokeless tobacco. She reports that she does not currently use alcohol. She reports that she does not use drugs.The patient is {Blank single:19197::"alone","accompanied by"} *** today.  Allergies:  Allergies  Allergen Reactions   Penicillins Rash    Current Medications: Current Outpatient Medications  Medication Sig Dispense Refill   Accu-Chek Softclix Lancets lancets USE TO CHECK BLOOD GLUCOSE TWICE DAILY.     albuterol (VENTOLIN HFA) 108 (90 Base) MCG/ACT inhaler Inhale 2 puffs into the lungs every 6 (six) hours as needed for shortness of breath or wheezing.     aspirin 81 MG EC tablet Take 81 mg by mouth daily. (Patient not taking: No sig reported)     atorvastatin (LIPITOR) 40 MG tablet Take 40 mg by mouth daily.     Blood Glucose Monitoring Suppl (GLUCOCOM BLOOD GLUCOSE MONITOR) DEVI 1 each by Misc.(Non-Drug; Combo Route) route daily.     diphenoxylate-atropine (LOMOTIL) 2.5-0.025 MG tablet Take 2 tablets by mouth 4 (four) times daily as needed for diarrhea or loose stools. 60 tablet 0   furosemide (LASIX) 20 MG tablet Take 20 mg by mouth daily.     gabapentin (NEURONTIN) 300 MG capsule Take 300 mg by mouth 2 (two) times daily.     glucose blood (ACCU-CHEK AVIVA PLUS) test strip Check blood glucose twice daily and as needed. Dx E 11.65     HYDROcodone-acetaminophen (NORCO) 7.5-325 MG tablet Take 1 tablet by mouth every 6 (six) hours as needed for moderate pain. 60 tablet 0   Lancets Misc. (ACCU-CHEK FASTCLIX LANCET) KIT Check blood glucose twice daily. Dx E 11.65     levothyroxine (SYNTHROID) 100 MCG tablet Take 100 mcg by mouth  daily before breakfast.     metFORMIN (GLUCOPHAGE-XR) 500 MG 24 hr tablet Take 500 mg by mouth 2 (two) times daily with a meal.     montelukast (SINGULAIR) 10 MG tablet Take 10 mg by mouth daily.     ondansetron (ZOFRAN) 4 MG tablet Take 1 tablet (4 mg total) by mouth every 4 (four) hours as needed for nausea. 90 tablet 3   ondansetron (ZOFRAN) 8 MG tablet Take 1 tablet (8 mg total) by mouth every 8 (eight) hours as needed for nausea or vomiting. 20 tablet 1   pantoprazole (PROTONIX) 40 MG tablet Take 40 mg by mouth 2 (two) times daily.     potassium chloride SA (KLOR-CON) 20 MEQ tablet Take 1 tablet (20 mEq total) by mouth 2 (two) times daily. 60 tablet 3   prochlorperazine (COMPAZINE) 10 MG tablet Take 1 tablet (10 mg total) by mouth every 6 (six) hours as needed for nausea or vomiting. 90 tablet 3   prochlorperazine (COMPAZINE) 10 MG tablet Take 1 tablet (10 mg total) by mouth every 6 (six) hours as needed for nausea or vomiting. 90 tablet 3   Current Facility-Administered Medications  Medication  Dose Route Frequency Provider Last Rate Last Admin   triamcinolone acetonide (KENALOG) 10 MG/ML injection 10 mg  10 mg Other Once Landis Martins, DPM       triamcinolone acetonide (KENALOG-40) injection 20 mg  20 mg Other Once Landis Martins, DPM

## 2021-03-15 ENCOUNTER — Ambulatory Visit: Payer: Medicaid Other | Admitting: Hematology and Oncology

## 2021-03-15 ENCOUNTER — Other Ambulatory Visit: Payer: Medicaid Other

## 2021-03-26 ENCOUNTER — Other Ambulatory Visit: Payer: Self-pay | Admitting: Hematology and Oncology

## 2021-03-26 ENCOUNTER — Inpatient Hospital Stay: Payer: Medicaid Other | Attending: Oncology

## 2021-03-26 ENCOUNTER — Encounter: Payer: Self-pay | Admitting: Hematology and Oncology

## 2021-03-26 ENCOUNTER — Inpatient Hospital Stay (INDEPENDENT_AMBULATORY_CARE_PROVIDER_SITE_OTHER): Payer: Medicaid Other | Admitting: Hematology and Oncology

## 2021-03-26 ENCOUNTER — Other Ambulatory Visit: Payer: Self-pay

## 2021-03-26 DIAGNOSIS — C20 Malignant neoplasm of rectum: Secondary | ICD-10-CM

## 2021-03-26 DIAGNOSIS — Z923 Personal history of irradiation: Secondary | ICD-10-CM | POA: Diagnosis not present

## 2021-03-26 DIAGNOSIS — E876 Hypokalemia: Secondary | ICD-10-CM | POA: Insufficient documentation

## 2021-03-26 DIAGNOSIS — Z9221 Personal history of antineoplastic chemotherapy: Secondary | ICD-10-CM | POA: Diagnosis not present

## 2021-03-26 LAB — CBC AND DIFFERENTIAL
HCT: 36 (ref 36–46)
Hemoglobin: 12.5 (ref 12.0–16.0)
Neutrophils Absolute: 1.52
Platelets: 212 (ref 150–399)
WBC: 3.1

## 2021-03-26 LAB — HEPATIC FUNCTION PANEL
ALT: 18 (ref 7–35)
AST: 34 (ref 13–35)
Alkaline Phosphatase: 72 (ref 25–125)
Bilirubin, Total: 0.4

## 2021-03-26 LAB — COMPREHENSIVE METABOLIC PANEL
Albumin: 4.2 (ref 3.5–5.0)
Calcium: 9.1 (ref 8.7–10.7)

## 2021-03-26 LAB — CBC
MCV: 87 (ref 81–99)
RBC: 4.2 (ref 3.87–5.11)

## 2021-03-26 LAB — BASIC METABOLIC PANEL
BUN: 10 (ref 4–21)
CO2: 29 — AB (ref 13–22)
Chloride: 101 (ref 99–108)
Creatinine: 0.6 (ref 0.5–1.1)
Glucose: 157
Potassium: 3.8 (ref 3.4–5.3)
Sodium: 138 (ref 137–147)

## 2021-03-26 LAB — PROTEIN, TOTAL: Total Protein: 8.5 g/dL — AB (ref 6.3–8.2)

## 2021-03-26 NOTE — Assessment & Plan Note (Signed)
Potassium is normal at 3.8 today. She will continue with oral supplement.

## 2021-03-26 NOTE — Progress Notes (Signed)
Patient Care Team: Tanya Milch, NP as PCP - General (Family Medicine) Tanya Settle, MD (Gastroenterology) Tanya Mayer, MD as Consulting Physician (Radiation Oncology) Tanya Kaplan, MD as Consulting Physician (Oncology)  Clinic Day:  03/26/2021  Referring physician: Garnetta Buddy I, NP  ASSESSMENT & PLAN:   Assessment & Plan: Rectal cancer [facility]) She completed FOLFOX x 8 cycles, having tolerated them well. CBC and CMP are unremarkable today. We will plan to see her back in 3 months for repeat evaluation.   Hypokalemia Potassium is normal at 3.8 today. She will continue with oral supplement.    The patient understands the plans discussed today and is in agreement with them.  She knows to contact our office if she develops concerns prior to her next appointment.    Tanya Ped, NP  [facility] AT [facility] [address] [place] [address] Dept: [phone] Dept Fax: [phone]   Orders Placed This Encounter  Procedures   CBC w Diff ([facility] scanned report) STAT    Standing Status:   Future    Standing Expiration Date:   03/26/2022   CMP ([facility] scanned report) STAT    Standing Status:   Future    Standing Expiration Date:   03/26/2022      CHIEF COMPLAINT:  CC: A 60 year old female with history of colorectal cancer here for 2 week evaluation  Current Treatment:  Surveillance  INTERVAL HISTORY:  Tanya Mcclure is here today for repeat clinical assessment. She denies fevers or chills. She denies pain. Her appetite is good. Her weight has been stable.  Mcclure have reviewed the past medical history, past surgical history, social history and family history with the patient and they are unchanged from previous note.  ALLERGIES:  is allergic to penicillins.  MEDICATIONS:  Current Outpatient Medications  Medication Sig Dispense Refill   Accu-Chek Softclix Lancets lancets USE TO CHECK BLOOD GLUCOSE  TWICE DAILY.     albuterol (VENTOLIN HFA) 108 (90 Base) MCG/ACT inhaler Inhale 2 puffs into the lungs every 6 (six) hours as needed for shortness of breath or wheezing.     atorvastatin (LIPITOR) 40 MG tablet Take 40 mg by mouth daily.     Blood Glucose Monitoring Suppl (GLUCOCOM BLOOD GLUCOSE MONITOR) DEVI 1 each by Misc.(Non-Drug; Combo Route) route daily.     diphenoxylate-atropine (LOMOTIL) 2.5-0.025 MG tablet Take 2 tablets by mouth 4 (four) times daily as needed for diarrhea or loose stools. 60 tablet 0   furosemide (LASIX) 20 MG tablet Take 20 mg by mouth daily.     gabapentin (NEURONTIN) 300 MG capsule Take 300 mg by mouth 2 (two) times daily.     glucose blood (ACCU-CHEK AVIVA PLUS) test strip Check blood glucose twice daily and as needed. Dx E 11.65     HYDROcodone-acetaminophen (NORCO) 7.5-325 MG tablet Take 1 tablet by mouth every 6 (six) hours as needed for moderate pain. 60 tablet 0   Lancets Misc. (ACCU-CHEK FASTCLIX LANCET) KIT Check blood glucose twice daily. Dx E 11.65     levothyroxine (SYNTHROID) 100 MCG tablet Take 100 mcg by mouth daily before breakfast.     metFORMIN (GLUCOPHAGE-XR) 500 MG 24 hr tablet Take 500 mg by mouth 2 (two) times daily with a meal.     montelukast (SINGULAIR) 10 MG tablet Take 10 mg by mouth daily.     ondansetron (ZOFRAN) 4 MG tablet Take 1 tablet (4 mg total) by mouth every  4 (four) hours as needed for nausea. 90 tablet 3   ondansetron (ZOFRAN) 8 MG tablet Take 1 tablet (8 mg total) by mouth every 8 (eight) hours as needed for nausea or vomiting. 20 tablet 1   pantoprazole (PROTONIX) 40 MG tablet Take 40 mg by mouth 2 (two) times daily.     potassium chloride SA (KLOR-CON) 20 MEQ tablet Take 1 tablet (20 mEq total) by mouth 2 (two) times daily. 60 tablet 3   prochlorperazine (COMPAZINE) 10 MG tablet Take 1 tablet (10 mg total) by mouth every 6 (six) hours as needed for nausea or vomiting. 90 tablet 3   prochlorperazine (COMPAZINE) 10 MG tablet Take 1  tablet (10 mg total) by mouth every 6 (six) hours as needed for nausea or vomiting. 90 tablet 3   Current Facility-Administered Medications  Medication Dose Route Frequency Provider Last Rate Last Admin   triamcinolone acetonide (KENALOG) 10 MG/ML injection 10 mg  10 mg Other Once Tanya Mcclure, DPM       triamcinolone acetonide (KENALOG-40) injection 20 mg  20 mg Other Once Tanya Mcclure, DPM        HISTORY OF PRESENT ILLNESS:   Oncology History  Rectal cancer ([place])  04/20/2020 Procedure   COLONOSCOPY: Rectal mass beginning at about 2 cm from the anal verge and was noted to obstruct 50% of the circumference of the rectum.  Benign polyps were also present and removed.   04/20/2020 Pathology Results   Rectum, biopsy:  -  Invasive adenocarcinoma    04/22/2020 Cancer Staging   Staging form: Colon and Rectum, AJCC 8th Edition - Clinical stage from 04/22/2020: Stage IIIB (cT3, cN1b, cM0) - Signed by Tanya Kaplan, MD on 06/04/2020    04/23/2020 Initial Diagnosis   Rectal cancer ([place])   04/24/2020 Imaging   CT ABDOMEN AND PELVIS WITH CONTRAST: 1. Known rectal lesion not well demonstrated by CT. There are several small perirectal and presacral lymph nodes measuring up to 8 mm short axis. These are highly concerning for metastatic involvement. 2. Right middle lobe pulmonary nodules, stable 2016 and consistent with benign etiology. 3. 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 4. Aortic Atherosclerosis (ICD10-I70.0).   05/17/2020 - 06/26/2020 Radiation Therapy   1. Pelvis:  4500 cGy in 25 fractions 2. Rectal boost:  540 cGy in 3 fractions   05/21/2020 - 06/22/2020 Chemotherapy   Concurrent chemoradiation with infusional 5 FU        08/22/2020 Pathology Results   A. COLON, SIGMOID, RECTUM, ANUS, RESECTION:  - Invasive colorectal adenocarcinoma, 3 cm.  - Carcinoma extends into perirectal connective tissue.  - Nineteen benign lymph nodes (0/19).   - Margins not involved.    08/31/2020 Cancer Staging   Staging form: Colon and Rectum, AJCC 8th Edition - Pathologic stage from 08/31/2020: Stage IIA (ypT3, pN0, cM0) - Signed by Tanya Kaplan, MD on 01/14/2021 Histopathologic type: Adenocarcinoma, NOS Stage prefix: Post-therapy Response to neoadjuvant therapy: Partial response Total positive nodes: 0 Total nodes examined: 19 Histologic grading system: 4 grade system Histologic grade (G): G2 Residual tumor (R): R0 - None Tumor size (mm): 30 Lymph-vascular invasion (LVI): LVI not present (absent)/not identified Diagnostic confirmation: Positive histology PLUS positive immunophenotyping and/or positive genetic studies Specimen type: Excision Staged by: Managing physician Tumor deposits (TD): Absent Perineural invasion (PNI): Absent Stage used in treatment planning: Yes National guidelines used in treatment planning: Yes Type of national guideline used in treatment planning: NCCN Staging comments: Rec FOLFOX  chemotherapy x 4 months    10/09/2020 - 02/14/2021 Chemotherapy   Patient is on Treatment Plan : COLORECTAL FOLFOX q14d x 4 months     Hypokalemia      REVIEW OF SYSTEMS:   Constitutional: Denies fevers, chills or abnormal weight loss Eyes: Denies blurriness of vision Ears, nose, mouth, throat, and face: Denies mucositis or sore throat Respiratory: Denies cough, dyspnea or wheezes Cardiovascular: Denies palpitation, chest discomfort or lower extremity swelling Gastrointestinal:  Denies nausea, heartburn or change in bowel habits Skin: Denies abnormal skin rashes Lymphatics: Denies new lymphadenopathy or easy bruising Neurological:Denies numbness, tingling or new weaknesses Behavioral/Psych: Mood is stable, no new changes  All other systems were reviewed with the patient and are negative.   VITALS:  Blood pressure (!) 147/75, pulse 85, temperature 98.4 F (36.9 C), temperature source Oral, resp. rate 18, height 5'  (1.524 m), weight 178 lb (80.7 kg), SpO2 97 %.  Wt Readings from Last 3 Encounters:  03/26/21 178 lb (80.7 kg)  02/14/21 181 lb 1.9 oz (82.2 kg)  02/12/21 180 lb 1.3 oz (81.7 kg)    Body mass index is 34.76 kg/m.  Performance status (ECOG): 1 - Symptomatic but completely ambulatory  PHYSICAL EXAM:   GENERAL:alert, no distress and comfortable SKIN: skin color, texture, turgor are normal, no rashes or significant lesions EYES: normal, Conjunctiva are pink and non-injected, sclera clear OROPHARYNX:no exudate, no erythema and lips, buccal mucosa, and tongue normal  NECK: supple, thyroid normal size, non-tender, without nodularity LYMPH:  no palpable lymphadenopathy in the cervical, axillary or inguinal LUNGS: clear to auscultation and percussion with normal breathing effort HEART: regular rate & rhythm and no murmurs and no lower extremity edema ABDOMEN:abdomen soft, non-tender and normal bowel sounds Musculoskeletal:no cyanosis of digits and no clubbing  NEURO: alert & oriented x 3 with fluent speech, no focal motor/sensory deficits  LABORATORY DATA:  Mccl[facility] have reviewed the data as listed    Component Value Date/Time   NA 138 03/26/2021 0000   K 3.8 03/26/2021 0000   CL 101 03/26/2021 0000   CO2 29 (A) 03/26/2021 0000   GLUCOSE 137 (H) 08/25/2020 0420   BUN 10 03/26/2021 0000   CREATININE 0.6 03/26/2021 0000   CREATININE 0.72 08/25/2020 0420   CALCIUM 9.1 03/26/2021 0000   PROT 8.5 (A) 03/26/2021 0000   ALBUMIN 4.2 03/26/2021 0000   AST 34 03/26/2021 0000   ALT 18 03/26/2021 0000   ALKPHOS 72 03/26/2021 0000   GFRNONAA >60 08/25/2020 0420   GFRAA  03/03/2007 1805    >60        The eGFR has been calculated using the MDRD equation. This calculation has not been validated in all clinical    No results found for: SPEP, UPEP  Lab Results  Component Value Date   WBC 3.1 03/26/2021   NEUTROABS 1.52 03/26/2021   HGB 12.5 03/26/2021   HCT 36 03/26/2021   MCV 87 03/26/2021    PLT 212 03/26/2021      Chemistry      Component Value Date/Time   NA 138 03/26/2021 0000   K 3.8 03/26/2021 0000   CL 101 03/26/2021 0000   CO2 29 (A) 03/26/2021 0000   BUN 10 03/26/2021 0000   CREATININE 0.6 03/26/2021 0000   CREATININE 0.72 08/25/2020 0420   GLU 157 03/26/2021 0000      Component Value Date/Time   CALCIUM 9.1 03/26/2021 0000   ALKPHOS 72 03/26/2021 0000   AST 34  03/26/2021 0000   ALT 18 03/26/2021 0000       RADIOGRAPHIC STUDIES: Mcclure have personally reviewed the radiological images as listed and agreed with the findings in the report. No results found.

## 2021-03-26 NOTE — Assessment & Plan Note (Addendum)
She completed FOLFOX x 8 cycles, having tolerated them well. CBC and CMP are unremarkable today. We will plan to see her back in 3 months for repeat evaluation.

## 2021-03-28 LAB — CEA: CEA: 1.8 ng/mL (ref 0.0–4.7)

## 2021-04-01 ENCOUNTER — Other Ambulatory Visit: Payer: Self-pay | Admitting: Hematology and Oncology

## 2021-04-01 ENCOUNTER — Telehealth: Payer: Self-pay

## 2021-04-01 ENCOUNTER — Other Ambulatory Visit: Payer: Self-pay

## 2021-04-01 MED ORDER — HYDROCODONE-ACETAMINOPHEN 7.5-325 MG PO TABS: 1.0000 | ORAL_TABLET | Freq: Four times a day (QID) | ORAL | 0 refills | Status: DC | PRN

## 2021-04-01 NOTE — Telephone Encounter (Addendum)
Pt notified that prescription has been sent in.  ----- Message from [name], NP sent at 04/01/2021 11:39 AM EST ----- Regarding: RE: Pain medication refill I guess I did forget. It is sent now.  ----- Message ----- From: Dairl Ponder, RN Sent: 04/01/2021  11:15 AM EST To: [name], NP Subject: Pain medication refill                         Pt states that you were going to send in her pain medication, as discussed @ last office visit. It isn't at her pharmacy, she is wondering if it just slipped your mind?

## 2021-04-02 ENCOUNTER — Other Ambulatory Visit: Payer: Self-pay

## 2021-04-03 ENCOUNTER — Telehealth: Payer: Self-pay | Admitting: Oncology

## 2021-04-03 NOTE — Telephone Encounter (Signed)
Per 11/15 LOS, patient scheduled for Feb 2023 Appt's.  Left Msg w/patient

## 2021-06-05 LAB — OPHTHALMOLOGY REPORT-SCANNED

## 2021-06-20 NOTE — Progress Notes (Signed)
[facility]  [address] [place],  [place]  [address] [phone]  Clinic Day:  06/26/2021  Referring physician: Garnetta Buddy I, NP  This document serves as a record of services personally performed by Hosie Poisson, MD. It was created on their behalf by Curry,Lauren E, a trained medical scribe. The creation of this record is based on the scribe's personal observations and the provider's statements to them.  ASSESSMENT & PLAN:   Assessment: 1.  Clinical stage IIIB ([id number]) rectal cancer, diagnosed in December 2021. CT revealed several small perirectal and presacral lymph nodes, which were highly concerning for metastatic involvement. She underwent abdominoperitoneal resection in April 2022 after neoadjuvant chemoradiation and lymph nodes were negative.   She completed 8 cycles of adjuvant FOLFOX chemotherapy in early October 2022.  She remains without evidence of recurrence.  2. 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  Plan: We flushed her port today, and she wishes to keep this for now. She is due for colonoscopy and plans to schedule this in the near future with Dr. Melina Copa. I will send in refills for pantoprazole, Zofran and hydrocodone 7.5/325 as requested. Otherwise, we will see her back in 3 months with CBC, CMP and CEA for repeat evaluation and port flush. She verbalizes understanding of and agreement to the plans discussed today. She knows to call the office should any new questions or concerns arise.   I provided 15 minutes of face-to-face time during this this encounter and > 50% was spent counseling as documented under my assessment and plan.    [facility] [address] [place] [address] Dept: [phone] Dept Fax: [phone]   No orders of the defined types were placed in this encounter.    CHIEF COMPLAINT:  CC:  Stage IIIB rectal  cancer   Current Treatment:  Surveillance   HISTORY OF PRESENT ILLNESS:   Oncology History  Rectal cancer ([place])  04/20/2020 Procedure   COLONOSCOPY: Rectal mass beginning at about 2 cm from the anal verge and was noted to obstruct 50% of the circumference of the rectum.  Benign polyps were also present and removed.   04/20/2020 Pathology Results   Rectum, biopsy:  -  Invasive adenocarcinoma    04/22/2020 Cancer Staging   Staging form: Colon and Rectum, AJCC 8th Edition - Clinical stage from 04/22/2020: Stage IIIB (cT3, cN1b, cM0) - Signed by Derwood Kaplan, MD on 06/04/2020    04/23/2020 Initial Diagnosis   Rectal cancer ([place])   04/24/2020 Imaging   CT ABDOMEN AND PELVIS WITH CONTRAST: 1. Known rectal lesion not well demonstrated by CT. There are several small perirectal and presacral lymph nodes measuring up to 8 mm short axis. These are highly concerning for metastatic involvement. 2. Right middle lobe pulmonary nodules, stable 2016 and consistent with benign etiology. 3. 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 4. Aortic Atherosclerosis (ICD10-I70.0).   05/17/2020 - 06/26/2020 Radiation Therapy   1. Pelvis:  4500 cGy in 25 fractions 2. Rectal boost:  540 cGy in 3 fractions   05/21/2020 - 06/22/2020 Chemotherapy   Concurrent chemoradiation with infusional 5 FU        08/22/2020 Pathology Results   A. COLON, SIGMOID, RECTUM, ANUS, RESECTION:  - Invasive colorectal adenocarcinoma, 3 cm.  - Carcinoma extends into perirectal connective tissue.  - Nineteen benign lymph nodes (0/19).  - Margins not involved.  08/31/2020 Cancer Staging   Staging form: Colon and Rectum, AJCC 8th Edition - Pathologic stage from 08/31/2020: Stage IIA (ypT3, pN0, cM0) - Signed by Derwood Kaplan, MD on 01/14/2021 Histopathologic type: Adenocarcinoma, NOS Stage prefix: Post-therapy Response to neoadjuvant therapy: Partial response Total positive nodes:  0 Total nodes examined: 19 Histologic grading system: 4 grade system Histologic grade (G): G2 Residual tumor (R): R0 - None Tumor size (mm): 30 Lymph-vascular invasion (LVI): LVI not present (absent)/not identified Diagnostic confirmation: Positive histology PLUS positive immunophenotyping and/or positive genetic studies Specimen type: Excision Staged by: Managing physician Tumor deposits (TD): Absent Perineural invasion (PNI): Absent Stage used in treatment planning: Yes National guidelines used in treatment planning: Yes Type of national guideline used in treatment planning: NCCN Staging comments: Rec FOLFOX chemotherapy x 4 months    10/09/2020 - 02/14/2021 Chemotherapy   Patient is on Treatment Plan : COLORECTAL FOLFOX q14d x 4 months     Hypokalemia     INTERVAL HISTORY:  Tanya Mcclure is here for routine follow up and states that she is doing well other than residual fatigue and neuropathy of the bilateral feet, grade 1. Otherwise, she is doing fairly well. She states that she need refills of pantoprazole and Zofran. She will have occasional nausea. She continues hydrocodone 7.5/325 as needed for pain. Blood counts and chemistries are unremarkable. Her  appetite is good, and she has gained 11 pounds since her last visit.  She denies fever, chills or other signs of infection.  She denies nausea, vomiting, bowel issues, or abdominal pain.  She denies sore throat, cough, dyspnea, or chest pain.  REVIEW OF SYSTEMS:  Review of Systems  Constitutional:  Positive for fatigue. Negative for appetite change, chills, fever and unexpected weight change.  HENT:  Negative.    Eyes: Negative.   Respiratory: Negative.  Negative for chest tightness, cough, hemoptysis, shortness of breath and wheezing.   Cardiovascular: Negative.  Negative for chest pain, leg swelling and palpitations.  Gastrointestinal:  Positive for nausea (intermittent). Negative for abdominal distention, abdominal pain, blood in  stool, constipation, diarrhea and vomiting.  Endocrine: Negative.   Genitourinary: Negative.  Negative for difficulty urinating, dysuria, frequency and hematuria.   Musculoskeletal: Negative.  Negative for arthralgias, back pain, flank pain, gait problem and myalgias.  Skin: Negative.   Neurological:  Positive for numbness (neuropathy of the bilateral feet, grade 1). Negative for dizziness, extremity weakness, gait problem, headaches, light-headedness, seizures and speech difficulty.  Hematological: Negative.   Psychiatric/Behavioral: Negative.  Negative for depression and sleep disturbance. The patient is not nervous/anxious.     VITALS:  Blood pressure (!) 157/79, pulse 82, temperature 98.4 F (36.9 C), temperature source Oral, resp. rate 19, weight 189 lb (85.7 kg), SpO2 97 %.  Wt Readings from Last 3 Encounters:  06/26/21 189 lb (85.7 kg)  03/26/21 178 lb (80.7 kg)  02/14/21 181 lb 1.9 oz (82.2 kg)    Body mass index is 36.91 kg/m.  Performance status (ECOG): 1 - Symptomatic but completely ambulatory  PHYSICAL EXAM:  Physical Exam Constitutional:      General: She is not in acute distress.    Appearance: Normal appearance. She is normal weight.  HENT:     Head: Normocephalic and atraumatic.  Eyes:     General: No scleral icterus.    Extraocular Movements: Extraocular movements intact.     Conjunctiva/sclera: Conjunctivae normal.     Pupils: Pupils are equal, round, and reactive to light.  Cardiovascular:  Rate and Rhythm: Normal rate and regular rhythm.     Pulses: Normal pulses.     Heart sounds: Normal heart sounds. No murmur heard.   No friction rub. No gallop.  Pulmonary:     Effort: Pulmonary effort is normal. No respiratory distress.     Breath sounds: Normal breath sounds.  Abdominal:     General: Bowel sounds are normal. There is no distension.     Palpations: Abdomen is soft. There is no hepatomegaly, splenomegaly or mass.     Tenderness: There is no  abdominal tenderness.     Comments: Pink stoma, no bleeding. Brown soft stool in the colostomy bag of the left upper quadrant.  Musculoskeletal:        General: Normal range of motion.     Cervical back: Normal range of motion and neck supple.     Right lower leg: No edema.     Left lower leg: No edema.  Lymphadenopathy:     Cervical: No cervical adenopathy.  Skin:    General: Skin is warm and dry.  Neurological:     General: No focal deficit present.     Mental Status: She is alert and oriented to person, place, and time. Mental status is at baseline.  Psychiatric:        Mood and Affect: Mood normal.        Behavior: Behavior normal.        Thought Content: Thought content normal.        Judgment: Judgment normal.    LABS:   CBC Latest Ref Rng & Units 06/26/2021 03/26/2021 02/07/2021  WBC - 6.2 3.1 3.5  Hemoglobin 12.0 - 16.0 13.9 12.5 12.1  Hematocrit 36 - 46 41 36 36  Platelets 150 - 399 223 212 166   CMP Latest Ref Rng & Units 06/26/2021 03/26/2021 02/07/2021  Glucose 70 - 99 mg/dL - - -  BUN 4 - _0 Creatinine 0.5 - 1.1 0.6 0.6 0.7  Sodium 137 - 147 138 138 137  Potassium 3.4 - 5.3 3.8 3.8 3.3(A)  Chloride 99 - 108 101 101 100  CO2 13 - 22 26(A) 29(A) 29(A)  Calcium 8.7 - 10.7 9.2 9.1 9.1  Total Protein 6.3 - 8.2 g/dL - 8.5(A) -  Alkaline Phos 25 - 125 99 72 77  AST 13 - 35 35 34 32  ALT 7 - 35 _1 Lab Results  Component Value Date   CEA1 1.8 03/26/2021   /  CEA  Date Value Ref Range Status  03/26/2021 1.8 0.0 - 4.7 ng/mL Final    Comment:    (NOTE)                             Nonsmokers          <3.9                             Smokers             <5.6 [employer] Electrochemiluminescence Immunoassay (ECLIA) Values obtained with different assay methods or kits cannot be used interchangeably.  Results cannot be interpreted as absolute evidence of the presence or absence of malignant disease. Performed At: [facility] [place], [place] [id number] Rush Farmer MD OE:[id number]      STUDIES:  No results  found.    HISTORY:   Allergies:  Allergies  Allergen Reactions   Penicillins Rash    Current Medications: Current Outpatient Medications  Medication Sig Dispense Refill   Accu-Chek Softclix Lancets lancets USE TO CHECK BLOOD GLUCOSE TWICE DAILY.     albuterol (VENTOLIN HFA) 108 (90 Base) MCG/ACT inhaler Inhale 2 puffs into the lungs every 6 (six) hours as needed for shortness of breath or wheezing.     atorvastatin (LIPITOR) 40 MG tablet Take 40 mg by mouth daily.     Blood Glucose Monitoring Suppl (GLUCOCOM BLOOD GLUCOSE MONITOR) DEVI 1 each by Misc.(Non-Drug; Combo Route) route daily.     furosemide (LASIX) 20 MG tablet Take 20 mg by mouth daily.     gabapentin (NEURONTIN) 300 MG capsule Take 300 mg by mouth 2 (two) times daily.     glucose blood (ACCU-CHEK AVIVA PLUS) test strip Check blood glucose twice daily and as needed. Dx E 11.65     Lancets Misc. (ACCU-CHEK FASTCLIX LANCET) KIT Check blood glucose twice daily. Dx E 11.65     levothyroxine (SYNTHROID) 100 MCG tablet Take 100 mcg by mouth daily before breakfast.     metFORMIN (GLUCOPHAGE-XR) 500 MG 24 hr tablet Take 500 mg by mouth 2 (two) times daily with a meal.     montelukast (SINGULAIR) 10 MG tablet Take 10 mg by mouth daily.     potassium chloride SA (KLOR-CON) 20 MEQ tablet Take 1 tablet (20 mEq total) by mouth 2 (two) times daily. 60 tablet 3   HYDROcodone-acetaminophen (NORCO) 7.5-325 MG tablet Take 1 tablet by mouth every 6 (six) hours as needed for moderate pain. 90 tablet 0   ondansetron (ZOFRAN) 4 MG tablet Take 1 tablet (4 mg total) by mouth every 4 (four) hours as needed for nausea. 90 tablet 3   pantoprazole (PROTONIX) 40 MG tablet Take 1 tablet (40 mg total) by mouth 2 (two) times daily. 180 tablet 3   Current Facility-Administered Medications  Medication Dose Route Frequency Provider Last Rate  Last Admin   sodium chloride flush (NS) 0.9 % injection 10 mL  10 mL Intracatheter PRN [place] Scrape A, NP   10 mL at 06/26/21 1641   triamcinolone acetonide (KENALOG) 10 MG/ML injection 10 mg  10 mg Other Once Landis Martins, DPM       triamcinolone acetonide (KENALOG-40) injection 20 mg  20 mg Other Once Landis Martins, DPM         I, Rita Ohara, am acting as scribe for Derwood Kaplan, MD  I have reviewed this report as typed by the medical scribe, and it is complete and accurate.

## 2021-06-26 ENCOUNTER — Inpatient Hospital Stay (INDEPENDENT_AMBULATORY_CARE_PROVIDER_SITE_OTHER): Payer: Medicaid Other | Admitting: Oncology

## 2021-06-26 ENCOUNTER — Encounter: Payer: Self-pay | Admitting: Oncology

## 2021-06-26 ENCOUNTER — Other Ambulatory Visit: Payer: Self-pay | Admitting: Oncology

## 2021-06-26 ENCOUNTER — Other Ambulatory Visit: Payer: Self-pay | Admitting: Hematology and Oncology

## 2021-06-26 ENCOUNTER — Other Ambulatory Visit: Payer: Self-pay

## 2021-06-26 ENCOUNTER — Inpatient Hospital Stay: Payer: Medicaid Other | Attending: Oncology

## 2021-06-26 VITALS — BP 157/79 | HR 82 | Temp 98.4°F | Resp 19 | Wt 189.0 lb

## 2021-06-26 DIAGNOSIS — K2100 Gastro-esophageal reflux disease with esophagitis, without bleeding: Secondary | ICD-10-CM

## 2021-06-26 DIAGNOSIS — G629 Polyneuropathy, unspecified: Secondary | ICD-10-CM | POA: Diagnosis not present

## 2021-06-26 DIAGNOSIS — E876 Hypokalemia: Secondary | ICD-10-CM | POA: Diagnosis not present

## 2021-06-26 DIAGNOSIS — C19 Malignant neoplasm of rectosigmoid junction: Secondary | ICD-10-CM | POA: Insufficient documentation

## 2021-06-26 DIAGNOSIS — C20 Malignant neoplasm of rectum: Secondary | ICD-10-CM

## 2021-06-26 DIAGNOSIS — T451X5A Adverse effect of antineoplastic and immunosuppressive drugs, initial encounter: Secondary | ICD-10-CM

## 2021-06-26 DIAGNOSIS — G620 Drug-induced polyneuropathy: Secondary | ICD-10-CM | POA: Insufficient documentation

## 2021-06-26 LAB — HEPATIC FUNCTION PANEL
ALT: 24 (ref 7–35)
AST: 35 (ref 13–35)
Alkaline Phosphatase: 99 (ref 25–125)
Bilirubin, Total: 0.7

## 2021-06-26 LAB — BASIC METABOLIC PANEL
BUN: 15 (ref 4–21)
CO2: 26 — AB (ref 13–22)
Chloride: 101 (ref 99–108)
Creatinine: 0.6 (ref 0.5–1.1)
Glucose: 173
Potassium: 3.8 (ref 3.4–5.3)
Sodium: 138 (ref 137–147)

## 2021-06-26 LAB — CBC: RBC: 4.93 (ref 3.87–5.11)

## 2021-06-26 LAB — CBC AND DIFFERENTIAL
HCT: 41 (ref 36–46)
Hemoglobin: 13.9 (ref 12.0–16.0)
Neutrophils Absolute: 4.15
Platelets: 223 (ref 150–399)
WBC: 6.2

## 2021-06-26 LAB — COMPREHENSIVE METABOLIC PANEL
Albumin: 4.3 (ref 3.5–5.0)
Calcium: 9.2 (ref 8.7–10.7)

## 2021-06-26 MED ORDER — HEPARIN SOD (PORK) LOCK FLUSH 100 UNIT/ML IV SOLN
500.0000 [IU] | Freq: Once | INTRAVENOUS | Status: AC | PRN
  Administered 2021-06-26: 500 [IU]

## 2021-06-26 MED ORDER — PANTOPRAZOLE SODIUM 40 MG PO TBEC: 40.0000 mg | DELAYED_RELEASE_TABLET | Freq: Two times a day (BID) | ORAL | 3 refills | Status: AC

## 2021-06-26 MED ORDER — SODIUM CHLORIDE 0.9% FLUSH
10.0000 mL | INTRAVENOUS | Status: DC | PRN
  Administered 2021-06-26: 10 mL

## 2021-06-26 MED ORDER — HYDROCODONE-ACETAMINOPHEN 7.5-325 MG PO TABS: 1.0000 | ORAL_TABLET | Freq: Four times a day (QID) | ORAL | 0 refills | Status: DC | PRN

## 2021-06-26 MED ORDER — ONDANSETRON HCL 4 MG PO TABS: 4.0000 mg | ORAL_TABLET | ORAL | 3 refills | Status: DC | PRN

## 2021-06-26 NOTE — Progress Notes (Signed)
Patient here for oncology follow-up appointment,  concerns of fatigue and chronic neuropathy

## 2021-06-27 LAB — CEA: CEA: 2.1 ng/mL (ref 0.0–4.7)

## 2021-07-04 ENCOUNTER — Encounter: Payer: Self-pay | Admitting: Hematology and Oncology

## 2021-07-15 ENCOUNTER — Telehealth: Payer: Self-pay

## 2021-07-15 NOTE — Telephone Encounter (Signed)
Left message for patient to return my call.

## 2021-07-15 NOTE — Telephone Encounter (Signed)
-----   Message from Derwood Kaplan, MD sent at 07/04/2021  8:48 AM EST ----- ?Regarding: call ?Tell her CEA (cancer test) remains normal ? ?

## 2021-07-18 ENCOUNTER — Telehealth: Payer: Self-pay

## 2021-07-18 NOTE — Telephone Encounter (Signed)
-----   Message from Derwood Kaplan, MD sent at 07/04/2021  8:48 AM EST ----- ?Regarding: call ?Tell her CEA (cancer test) remains normal ? ?

## 2021-07-18 NOTE — Telephone Encounter (Signed)
Patient notified

## 2021-09-15 IMAGING — MR MR PELVIS W/O CM
9 of 10 series · 41 of 48 positions shown · non-contrast
Comparison: CT abdomen/pelvis dated 04/24/2020

CLINICAL DATA: Rectal cancer, diagnosed April 2020, status post
chemotherapy and radiation, pre-surgical evaluation

EXAM:
MRI PELVIS WITHOUT CONTRAST
TECHNIQUE: Multiplanar multisequence MR imaging of the pelvis was performed. No
intravenous contrast was administered. Small amount of US gel was
administered per rectum to optimize tumor evaluation.

[Series 2: T2 · sagittal · 4.5mm · 0.62mm/px · 2 of 25 slices shown (1 of 6)]
[im 1/25]
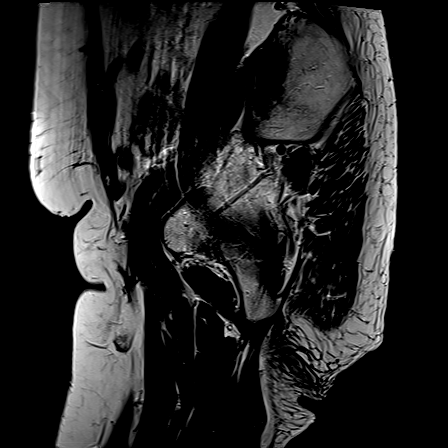
[im 25/25]
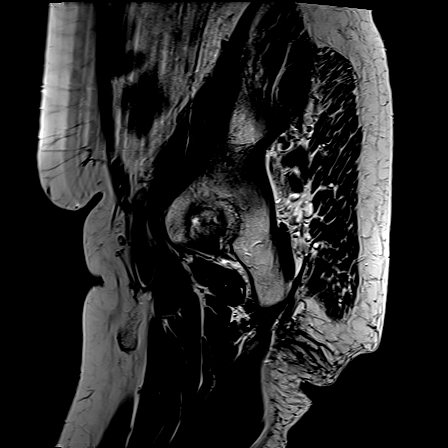

[Series 3: T2 · axial · 4.5mm · 0.62mm/px · z∈[-95,+116]mm · 5 of 40 slices shown (2 of 6)]
[im 1/40]
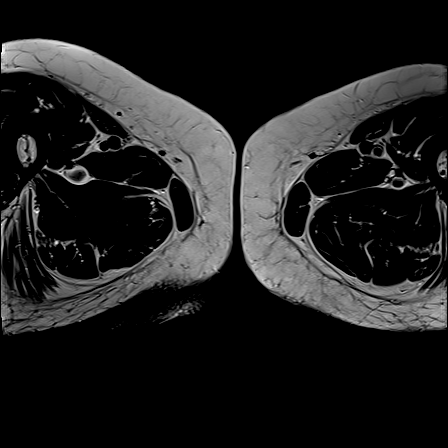
[im 10/40]
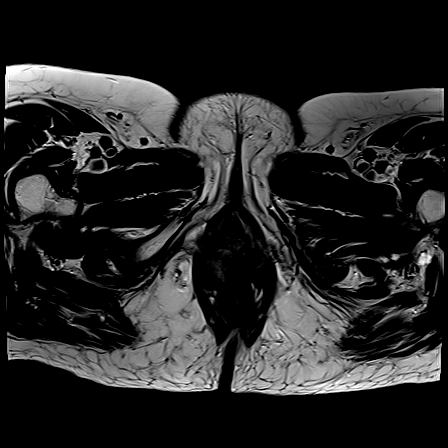
[im 20/40]
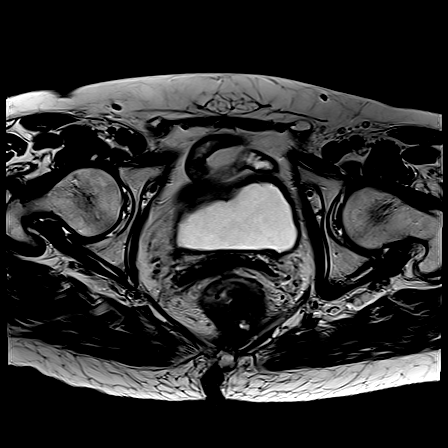
[im 30/40]
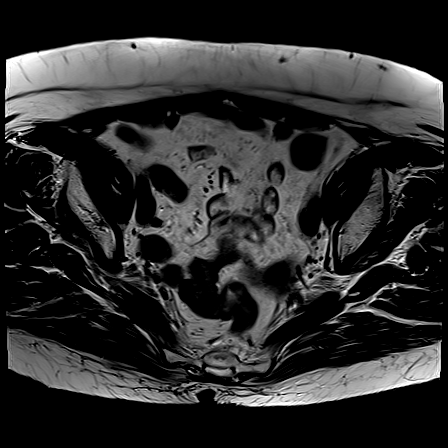
[im 40/40]
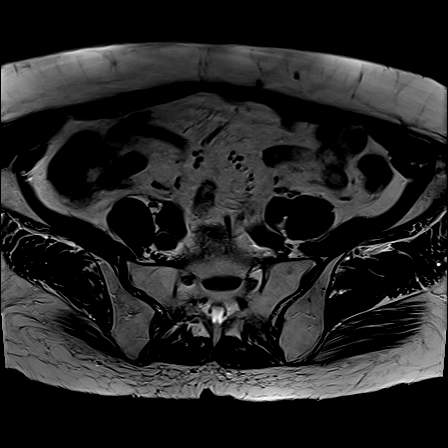

[Series 4: T2 · coronal · 4.0mm · 0.62mm/px · 5 of 38 slices shown (3 of 6)]
[im 1/38]
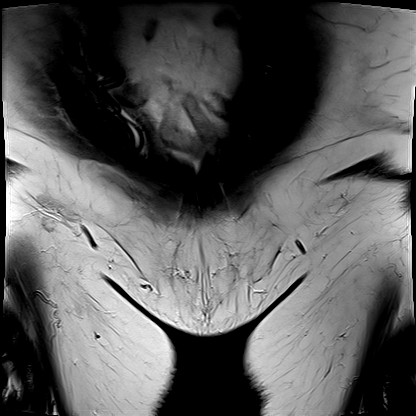
[im 10/38]
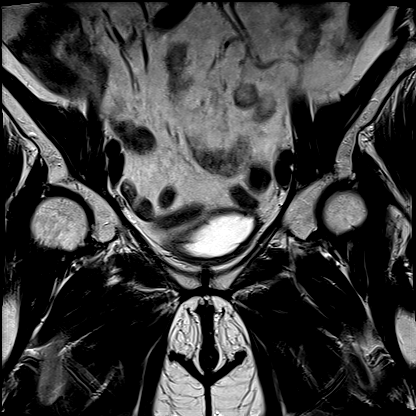
[im 19/38]
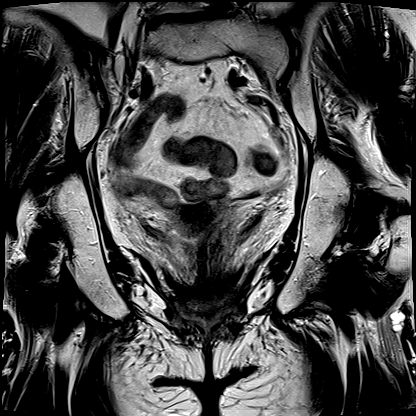
[im 28/38]
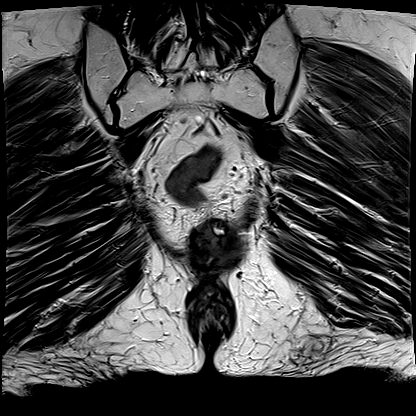
[im 38/38]
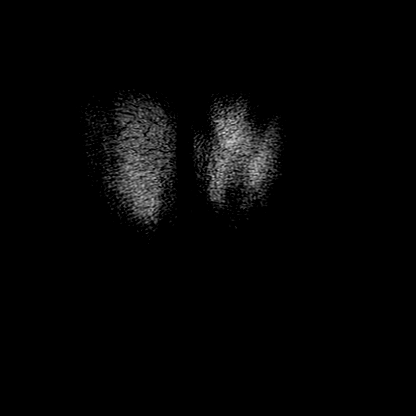

[Series 5: T2 · sagittal · 4.0mm · 0.62mm/px · 3 of 25 slices shown (4 of 6)]
[im 1/25]
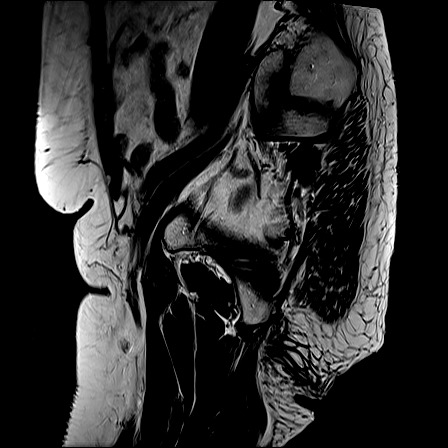
[im 13/25]
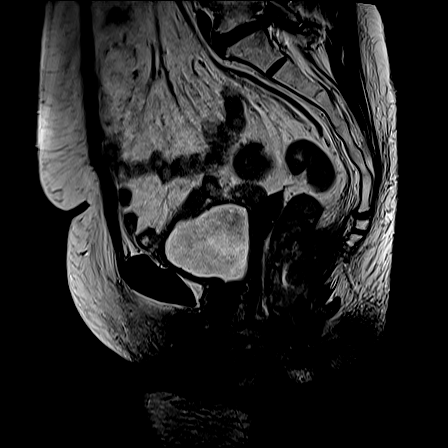
[im 25/25]
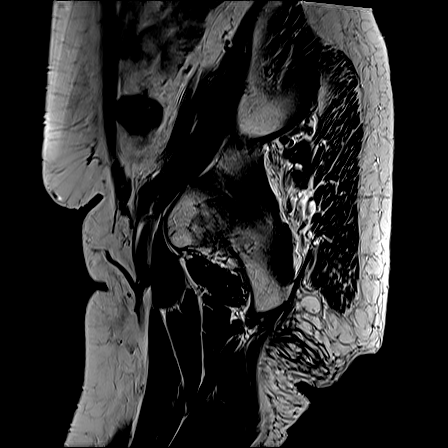

[Series 6: DWI · axial · 6.0mm · 1.42mm/px · z∈[-95,+114]mm · 8 of 60 slices shown (1 of 3)]
[im 1/60]
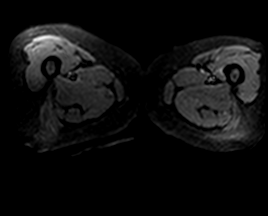
[im 9/60]
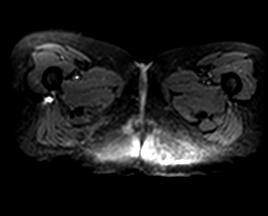
[im 17/60]
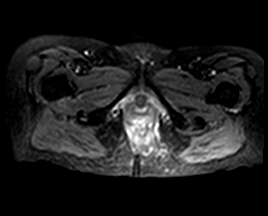
[im 26/60]
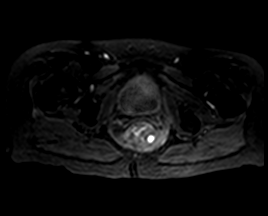
[im 34/60]
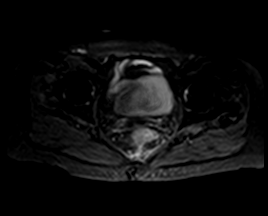
[im 43/60]
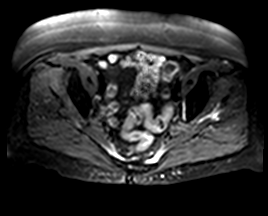
[im 51/60]
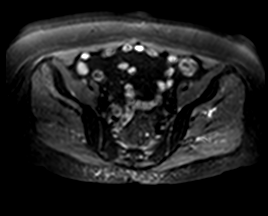
[im 60/60]
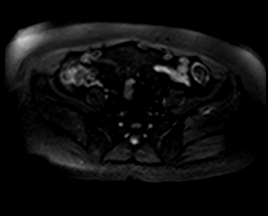

[Series 7: DWI · axial · 6.0mm · 1.42mm/px · z∈[-95,+114]mm · 4 of 30 slices shown (2 of 3)]
[im 1/30]
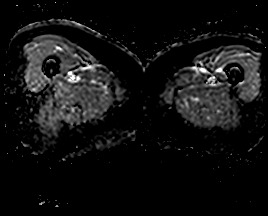
[im 10/30]
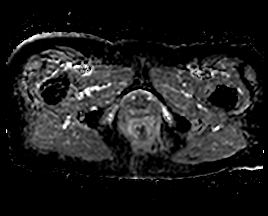
[im 20/30]
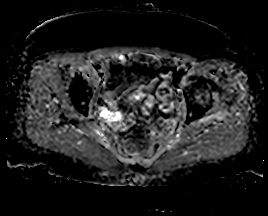
[im 30/30]
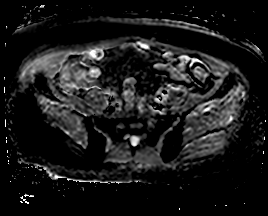

[Series 8: DWI · axial · 6.0mm · 1.42mm/px · z∈[-95,+114]mm · 4 of 30 slices shown (3 of 3)]
[im 1/30]
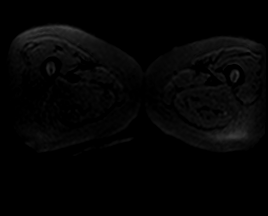
[im 10/30]
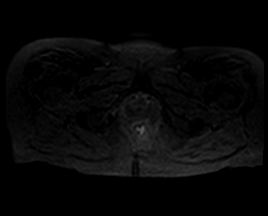
[im 20/30]
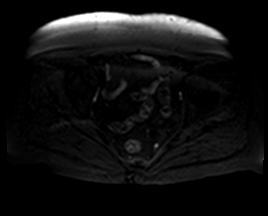
[im 30/30]
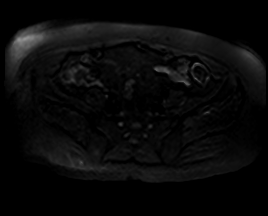

[Series 13: T2 · axial · 3.0mm · 0.62mm/px · z∈[-130,+30]mm · 6 of 44 slices shown (5 of 6)]
[im 1/44]
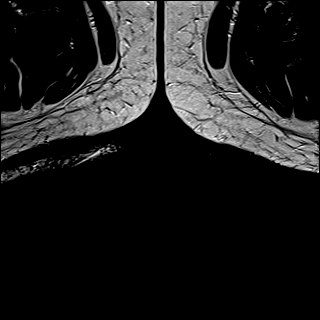
[im 9/44]
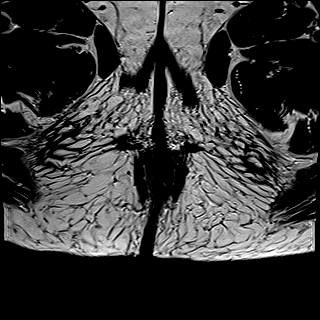
[im 18/44]
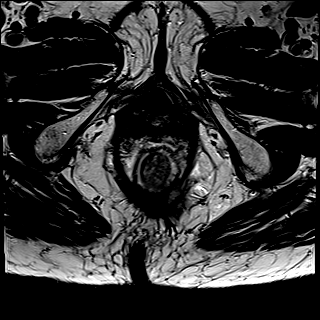
[im 26/44]
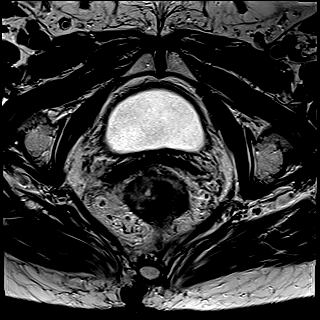
[im 35/44]
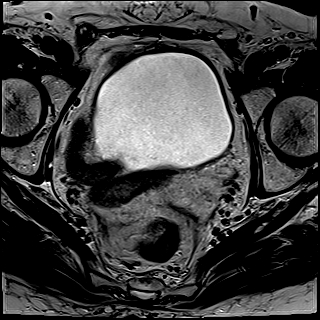
[im 44/44]
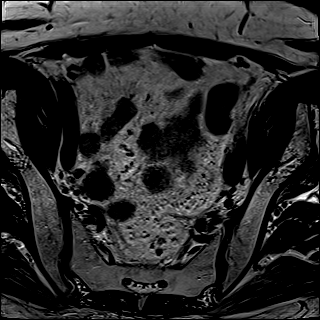

[Series 14: T2 · axial · 3.0mm · 0.62mm/px · z∈[-73,+25]mm · 4 of 44 slices shown (6 of 6)]
[im 1/44]
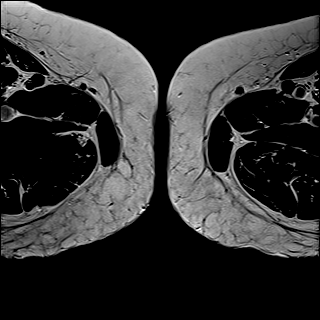
[im 9/44]
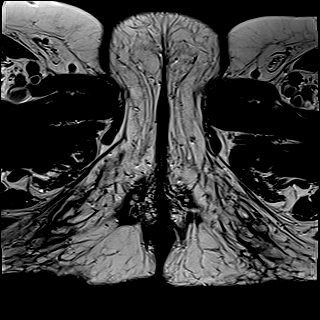
[im 18/44]
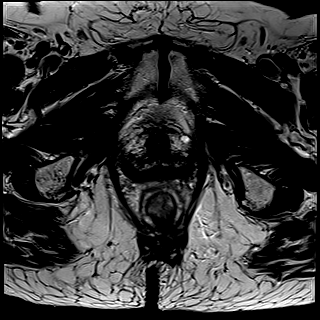
[im 26/44]
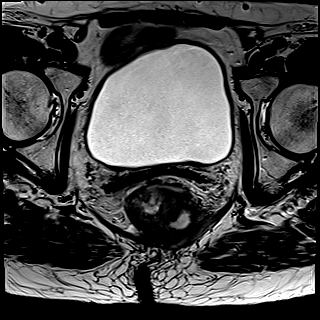

[41 of 48 positions shown; findings below may reference images not displayed]

FINDINGS: TUMOR LOCATION

Tumor distance from Anal Verge/Skin Surface:  4.0 cm

Tumor distance to Internal Anal Sphincter: 0 cm

TUMOR DESCRIPTION

Circumferential Extent: Tumor is likely circumferential, although
more bulky residual tumor extends posteriorly from [DATE] through 9
o'clock (series 3/image 23).

Tumor Length: 5.8 cm

T - CATEGORY

Extension through Muscularis Propria: Yes>15mm=T3d, with gross
perirectal extension extending from [DATE] to 6 o'clock measuring
approximately 16-17 mm (series 3/images 22-23).

Shortest Distance of any tumor/node from Mesorectal Fascia: 0 mm,
given left pelvic floor involvement (described below)

Extramural Vascular Invasion/Tumor Thrombus: No

Invasion of Anterior Peritoneal Reflection: No

Involvement of Adjacent Organs or Pelvic Sidewall: Yes=T4, tumor
abuts and likely involves the left levator ani muscle (series
3/image 23)

Levator Ani Involvement: Yes

N - CATEGORY

Mesorectal Lymph Nodes >=5mm: 1-3=N1

Extra-mesorectal Lymphadenopathy: No

Other:  None.
IMPRESSION: 5.8 cm circumferential distal rectal tumor abutting the internal
anal sphincter. Bulky residual tumor posteriorly with gross
perirectal extension along the left posterior aspect, as described
above, abutting/involving the left levator ani muscle.

Rectal adenocarcinoma T stage: T4

Rectal adenocarcinoma N stage:  N1

Distance from tumor to the internal anal sphincter is 0 cm.

## 2021-09-20 ENCOUNTER — Other Ambulatory Visit: Payer: Self-pay

## 2021-09-23 ENCOUNTER — Encounter: Payer: Self-pay | Admitting: Hematology and Oncology

## 2021-09-23 ENCOUNTER — Inpatient Hospital Stay: Payer: Medicaid Other

## 2021-09-23 ENCOUNTER — Inpatient Hospital Stay: Payer: Medicaid Other | Attending: Hematology and Oncology | Admitting: Hematology and Oncology

## 2021-09-23 ENCOUNTER — Other Ambulatory Visit: Payer: Self-pay

## 2021-09-23 DIAGNOSIS — C20 Malignant neoplasm of rectum: Secondary | ICD-10-CM

## 2021-09-23 DIAGNOSIS — T451X5A Adverse effect of antineoplastic and immunosuppressive drugs, initial encounter: Secondary | ICD-10-CM

## 2021-09-23 DIAGNOSIS — G620 Drug-induced polyneuropathy: Secondary | ICD-10-CM | POA: Diagnosis not present

## 2021-09-23 DIAGNOSIS — Z85048 Personal history of other malignant neoplasm of rectum, rectosigmoid junction, and anus: Secondary | ICD-10-CM | POA: Insufficient documentation

## 2021-09-23 LAB — CBC: RBC: 4.74 (ref 3.87–5.11)

## 2021-09-23 LAB — CBC AND DIFFERENTIAL
HCT: 39 (ref 36–46)
Hemoglobin: 13.2 (ref 12.0–16.0)
Neutrophils Absolute: 2.14
Platelets: 244 10*3/uL (ref 150–400)
WBC: 3.5

## 2021-09-23 LAB — BASIC METABOLIC PANEL
BUN: 14 (ref 4–21)
CO2: 28 — AB (ref 13–22)
Chloride: 101 (ref 99–108)
Creatinine: 0.6 (ref 0.5–1.1)
Glucose: 184
Potassium: 3.8 mEq/L (ref 3.5–5.1)
Sodium: 141 (ref 137–147)

## 2021-09-23 LAB — HEPATIC FUNCTION PANEL
ALT: 20 U/L (ref 7–35)
AST: 25 (ref 13–35)
Alkaline Phosphatase: 67 (ref 25–125)
Bilirubin, Total: 0.5

## 2021-09-23 LAB — COMPREHENSIVE METABOLIC PANEL
Albumin: 4.4 (ref 3.5–5.0)
Calcium: 9.4 (ref 8.7–10.7)

## 2021-09-23 MED ORDER — HYDROCODONE-ACETAMINOPHEN 7.5-325 MG PO TABS: 1.0000 | ORAL_TABLET | Freq: Four times a day (QID) | ORAL | 0 refills | Status: DC | PRN

## 2021-09-23 NOTE — Assessment & Plan Note (Addendum)
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 She is also having pain from her left hip down her leg. This is being managed by orthopedics and she is awaiting MRI. Her pain is 7/10 today. I will refill her hydrocodone today and send in prescription for walker.  ?

## 2021-09-23 NOTE — Progress Notes (Signed)
Patient Care Team: Oretha Milch, NP as PCP - General (Family Medicine) Nehemiah Settle, MD (Gastroenterology) Gatha Mayer, MD as Consulting Physician (Radiation Oncology) Derwood Kaplan, MD as Consulting Physician (Oncology)  Clinic Day:  09/23/2021  Referring physician: Garnetta Buddy I, NP  ASSESSMENT & PLAN:   Assessment & Plan: Rectal cancer [facility]) Clinical stage IIIB ([id number]) rectal cancer, diagnosed in December 2021. CT revealed several small perirectal and presacral lymph nodes, which were highly concerning for metastatic involvement. She underwent abdominoperitoneal resection in April 2022 after neoadjuvant chemoradiation and lymph nodes were negative.   She completed 8 cycles of adjuvant FOLFOX chemotherapy in early October 2022.  She remains without evidence of recurrence. We will plan to see her back in 3 months.   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 She is also having pain from her left hip down her leg. This is being managed by orthopedics and she is awaiting MRI. Her pain is 7/10 today. I will refill her hydrocodone today and send in prescription for walker.    The patient understands the plans discussed today and is in agreement with them.  She knows to contact our office if she develops concerns prior to her next appointment.    [name], NP  [facility] [address] [place] [address] Dept: [phone] Dept Fax: [phone]   Orders Placed This Encounter  Procedures   CBC and differential    This external order was created through the Results Console.   CBC    This external order was created through the Results Console.   Basic metabolic panel    This external order was created through the Results Console.   Comprehensive metabolic panel    This external order was created through the Results Console.    Hepatic function panel    This external order was created through the Results Console.      CHIEF COMPLAINT:  CC: A 61 year old female with history of colorectal cancer here for 3 month evaluation  Current Treatment:  Surveillance  INTERVAL HISTORY:  Fatou is here today for repeat clinical assessment. She denies fevers or chills. She denies pain. Her appetite is good. Her weight has decreased 10 pounds over last 3 months .  I have reviewed the past medical history, past surgical history, social history and family history with the patient and they are unchanged from previous note.  ALLERGIES:  is allergic to penicillins.  MEDICATIONS:  Current Outpatient Medications  Medication Sig Dispense Refill   Accu-Chek Softclix Lancets lancets USE TO CHECK BLOOD GLUCOSE TWICE DAILY.     albuterol (VENTOLIN HFA) 108 (90 Base) MCG/ACT inhaler Inhale 1-2 puffs into the lungs every 6 (six) hours as needed.     atorvastatin (LIPITOR) 40 MG tablet Take 40 mg by mouth daily.     Blood Glucose Monitoring Suppl (ACCU-CHEK GUIDE ME) w/Device KIT See admin instructions.     Blood Glucose Monitoring Suppl (GLUCOCOM BLOOD GLUCOSE MONITOR) DEVI 1 each by Misc.(Non-Drug; Combo Route) route daily.     budesonide-formoterol (SYMBICORT) 160-4.5 MCG/ACT inhaler Inhale into the lungs.     ergocalciferol (VITAMIN D2) 1.25 MG (50000 UT) capsule Take 1 capsule by mouth once a week.     furosemide (LASIX) 20 MG tablet Take 1 tablet by mouth daily.     gabapentin (NEURONTIN) 100 MG capsule Take by mouth.  gabapentin (NEURONTIN) 300 MG capsule Take by mouth.     glucose blood (ACCU-CHEK AVIVA PLUS) test strip Check blood glucose twice daily and as needed. Dx E 11.65     HYDROcodone-acetaminophen (NORCO) 7.5-325 MG tablet Take 1 tablet by mouth every 6 (six) hours as needed for moderate pain. 90 tablet 0   ibuprofen (ADVIL) 800 MG tablet Take by mouth.     Lancets Misc. (ACCU-CHEK FASTCLIX LANCET) KIT Check blood  glucose twice daily. Dx E 11.65     levothyroxine (SYNTHROID) 100 MCG tablet Take by mouth.     meloxicam (MOBIC) 7.5 MG tablet Take by mouth.     metFORMIN (GLUCOPHAGE-XR) 500 MG 24 hr tablet Take 500 mg by mouth 2 (two) times daily with a meal.     montelukast (SINGULAIR) 10 MG tablet Take 1 tablet by mouth daily.     ondansetron (ZOFRAN) 4 MG tablet Take 1 tablet (4 mg total) by mouth every 4 (four) hours as needed for nausea. 90 tablet 3   pantoprazole (PROTONIX) 40 MG tablet Take 1 tablet (40 mg total) by mouth 2 (two) times daily. 180 tablet 3   potassium chloride SA (KLOR-CON) 20 MEQ tablet Take 1 tablet (20 mEq total) by mouth 2 (two) times daily. 60 tablet 3   predniSONE (DELTASONE) 10 MG tablet Take 20 mg by mouth 2 (two) times daily.     prochlorperazine (COMPAZINE) 10 MG tablet Take 10 mg by mouth every 6 (six) hours as needed.     Semaglutide,0.25 or 0.5MG /DOS, 2 MG/1.5ML SOPN Inject into the skin.     traMADol (ULTRAM) 50 MG tablet Take by mouth.     Current Facility-Administered Medications  Medication Dose Route Frequency Provider Last Rate Last Admin   triamcinolone acetonide (KENALOG) 10 MG/ML injection 10 mg  10 mg Other Once Landis Martins, DPM       triamcinolone acetonide (KENALOG-40) injection 20 mg  20 mg Other Once Landis Martins, DPM        HISTORY OF PRESENT ILLNESS:   Oncology History  Rectal cancer ([place])  04/20/2020 Procedure   COLONOSCOPY: Rectal mass beginning at about 2 cm from the anal verge and was noted to obstruct 50% of the circumference of the rectum.  Benign polyps were also present and removed.   04/20/2020 Pathology Results   Rectum, biopsy:  -  Invasive adenocarcinoma    04/22/2020 Cancer Staging   Staging form: Colon and Rectum, AJCC 8th Edition - Clinical stage from 04/22/2020: Stage IIIB (cT3, cN1b, cM0) - Signed by Derwood Kaplan, MD on 06/04/2020    04/23/2020 Initial Diagnosis   Rectal cancer ([place])    04/24/2020 Imaging    CT ABDOMEN AND PELVIS WITH CONTRAST: 1. Known rectal lesion not well demonstrated by CT. There are several small perirectal and presacral lymph nodes measuring up to 8 mm short axis. These are highly concerning for metastatic involvement. 2. Right middle lobe pulmonary nodules, stable 2016 and consistent with benign etiology. 3. 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 4. Aortic Atherosclerosis (ICD10-I70.0).   05/17/2020 - 06/26/2020 Radiation Therapy   1. Pelvis:  4500 cGy in 25 fractions 2. Rectal boost:  540 cGy in 3 fractions   05/21/2020 - 06/22/2020 Chemotherapy   Concurrent chemoradiation with infusional 5 FU        08/22/2020 Pathology Results   A. COLON, SIGMOID, RECTUM, ANUS, RESECTION:  - Invasive colorectal adenocarcinoma, 3 cm.  - Carcinoma extends into perirectal connective  tissue.  - Nineteen benign lymph nodes (0/19).  - Margins not involved.    08/31/2020 Cancer Staging   Staging form: Colon and Rectum, AJCC 8th Edition - Pathologic stage from 08/31/2020: Stage IIA (ypT3, pN0, cM0) - Signed by Dellia Beckwith, MD on 01/14/2021 Histopathologic type: Adenocarcinoma, NOS Stage prefix: Post-therapy Response to neoadjuvant therapy: Partial response Total positive nodes: 0 Total nodes examined: 19 Histologic grading system: 4 grade system Histologic grade (G): G2 Residual tumor (R): R0 - None Tumor size (mm): 30 Lymph-vascular invasion (LVI): LVI not present (absent)/not identified Diagnostic confirmation: Positive histology PLUS positive immunophenotyping and/or positive genetic studies Specimen type: Excision Staged by: Managing physician Tumor deposits (TD): Absent Perineural invasion (PNI): Absent Stage used in treatment planning: Yes National guidelines used in treatment planning: Yes Type of national guideline used in treatment planning: NCCN Staging comments: Rec FOLFOX chemotherapy x 4 months    10/09/2020 - 02/14/2021  Chemotherapy   Patient is on Treatment Plan : COLORECTAL FOLFOX q14d x 4 months      Hypokalemia      REVIEW OF SYSTEMS:   Constitutional: Denies fevers, chills or abnormal weight loss Eyes: Denies blurriness of vision Ears, nose, mouth, throat, and face: Denies mucositis or sore throat Respiratory: Denies cough, dyspnea or wheezes Cardiovascular: Denies palpitation, chest discomfort or lower extremity swelling Gastrointestinal:  Denies nausea, heartburn or change in bowel habits Skin: Denies abnormal skin rashes Lymphatics: Denies new lymphadenopathy or easy bruising Neurological:Denies numbness, tingling or new weaknesses Behavioral/Psych: Mood is stable, no new changes  All other systems were reviewed with the patient and are negative.   VITALS:  There were no vitals taken for this visit.  Wt Readings from Last 3 Encounters:  06/26/21 189 lb (85.7 kg)  03/26/21 178 lb (80.7 kg)  02/14/21 181 lb 1.9 oz (82.2 kg)    There is no height or weight on file to calculate BMI.  Performance status (ECOG): 1 - Symptomatic but completely ambulatory  PHYSICAL EXAM:   GENERAL:alert, no distress and comfortable SKIN: skin color, texture, turgor are normal, no rashes or significant lesions EYES: normal, Conjunctiva are pink and non-injected, sclera clear OROPHARYNX:no exudate, no erythema and lips, buccal mucosa, and tongue normal  NECK: supple, thyroid normal size, non-tender, without nodularity LYMPH:  no palpable lymphadenopathy in the cervical, axillary or inguinal LUNGS: clear to auscultation and percussion with normal breathing effort HEART: regular rate & rhythm and no murmurs and no lower extremity edema ABDOMEN:abdomen soft, non-tender and normal bowel sounds Musculoskeletal:no cyanosis of digits and no clubbing  NEURO: alert & oriented x 3 with fluent speech, no focal motor/sensory deficits  LABORATORY DATA:  I have reviewed the data as listed    Component Value  Date/Time   NA 141 09/23/2021 0000   K 3.8 09/23/2021 0000   CL 101 09/23/2021 0000   CO2 28 (A) 09/23/2021 0000   GLUCOSE 137 (H) 08/25/2020 0420   BUN 14 09/23/2021 0000   CREATININE 0.6 09/23/2021 0000   CREATININE 0.72 08/25/2020 0420   CALCIUM 9.4 09/23/2021 0000   PROT 8.5 (A) 03/26/2021 0000   ALBUMIN 4.4 09/23/2021 0000   AST 25 09/23/2021 0000   ALT 20 09/23/2021 0000   ALKPHOS 67 09/23/2021 0000   GFRNONAA >60 08/25/2020 0420   GFRAA  03/03/2007 1805    >60        The eGFR has been calculated using the MDRD equation. This calculation has not been validated in all clinical  No results found for: SPEP, UPEP  Lab Results  Component Value Date   WBC 3.5 09/23/2021   NEUTROABS 2.14 09/23/2021   HGB 13.2 09/23/2021   HCT 39 09/23/2021   MCV 87 03/26/2021   PLT 244 09/23/2021      Chemistry      Component Value Date/Time   NA 141 09/23/2021 0000   K 3.8 09/23/2021 0000   CL 101 09/23/2021 0000   CO2 28 (A) 09/23/2021 0000   BUN 14 09/23/2021 0000   CREATININE 0.6 09/23/2021 0000   CREATININE 0.72 08/25/2020 0420   GLU 184 09/23/2021 0000      Component Value Date/Time   CALCIUM 9.4 09/23/2021 0000   ALKPHOS 67 09/23/2021 0000   AST 25 09/23/2021 0000   ALT 20 09/23/2021 0000       RADIOGRAPHIC STUDIES: I have personally reviewed the radiological images as listed and agreed with the findings in the report. No results found.

## 2021-09-23 NOTE — Assessment & Plan Note (Addendum)
Clinical stage IIIB ([id number]) rectal cancer, diagnosed in December 2021. CT revealed several small perirectal and presacral lymph nodes, which were highly concerning for metastatic involvement. She underwent abdominoperitoneal?resection in April 2022 after neoadjuvant chemoradiation and lymph nodes were negative. ??She completed 8 cycles of adjuvant FOLFOX chemotherapy in early October 2022. ?She remains without evidence of recurrence. We will plan to see her back in 3 months.  ?

## 2021-09-24 ENCOUNTER — Inpatient Hospital Stay: Payer: Medicaid Other

## 2021-09-24 VITALS — BP 157/72 | HR 78 | Temp 98.9°F | Resp 20 | Ht 63.0 in | Wt 178.2 lb

## 2021-09-24 DIAGNOSIS — Z85048 Personal history of other malignant neoplasm of rectum, rectosigmoid junction, and anus: Secondary | ICD-10-CM | POA: Diagnosis not present

## 2021-09-24 DIAGNOSIS — E876 Hypokalemia: Secondary | ICD-10-CM

## 2021-09-24 DIAGNOSIS — C20 Malignant neoplasm of rectum: Secondary | ICD-10-CM

## 2021-09-24 LAB — CEA: CEA: 1.5 ng/mL (ref 0.0–4.7)

## 2021-09-24 MED ORDER — SODIUM CHLORIDE 0.9% FLUSH
10.0000 mL | INTRAVENOUS | Status: DC | PRN
  Administered 2021-09-24: 10 mL

## 2021-09-24 MED ORDER — HEPARIN SOD (PORK) LOCK FLUSH 100 UNIT/ML IV SOLN
500.0000 [IU] | Freq: Once | INTRAVENOUS | Status: AC | PRN
  Administered 2021-09-24: 500 [IU]

## 2021-09-24 NOTE — Patient Instructions (Addendum)

## 2021-12-19 NOTE — Progress Notes (Signed)
[name]  [address] [place],    [address] [phone]  Clinic Day:  12/24/2021  Referring physician: Garnetta Buddy I, NP    ASSESSMENT & PLAN:   Assessment: 1.  Clinical stage IIIB ([id number]) rectal cancer, diagnosed in December 2021. CT revealed several small perirectal and presacral lymph nodes, which were highly concerning for metastatic involvement. She underwent abdominoperitoneal resection in April 2022 after neoadjuvant chemoradiation and lymph nodes were negative.   She completed 8 cycles of adjuvant FOLFOX chemotherapy in early October 2022.  She remains without evidence of recurrence.  2. 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  3.Left sided acute dental infection. She cannot afford a dentist and so we gave her information about the [facility]. For now I will prescribe Cleocin to control the infection since she is allergic to penicillin.   Plan: We will see her back in 3 months with CBC, CMP and CEA for repeat evaluation and port flush. She verbalizes understanding of and agreement to the plans discussed today. She knows to call the office should any new questions or concerns arise.   I provided 15 minutes of face-to-face time during this this encounter and > 50% was spent counseling as documented under my assessment and plan.    [facility] [address] [place] [address] Dept: [phone] Dept Fax: [phone]   Orders Placed This Encounter  Procedures   CBC and differential    This external order was created through the Results Console.   CBC    This external order was created through the Results Console.   Basic metabolic panel    This external order was created through the Results Console.   Comprehensive metabolic panel    This external order was created through the Results Console.   Hepatic function panel    This  external order was created through the Results Console.     CHIEF COMPLAINT:  CC:  Stage IIIB rectal cancer   Current Treatment:  Surveillance   HISTORY OF PRESENT ILLNESS:   Oncology History  Rectal cancer ([facility])  04/20/2020 Procedure   COLONOSCOPY: Rectal mass beginning at about 2 cm from the anal verge and was noted to obstruct 50% of the circumference of the rectum.  Benign polyps were also present and removed.   04/20/2020 Pathology Results   Rectum, biopsy:  -  Invasive adenocarcinoma    04/22/2020 Cancer Staging   Staging form: Colon and Rectum, AJCC 8th Edition - Clinical stage from 04/22/2020: Stage IIIB (cT3, cN1b, cM0) - Signed by Derwood Kaplan, MD on 06/04/2020   04/23/2020 Initial Diagnosis   Rectal cancer ([facility])   04/24/2020 Imaging   CT ABDOMEN AND PELVIS WITH CONTRAST: 1. Known rectal lesion not well demonstrated by CT. There are several small perirectal and presacral lymph nodes measuring up to 8 mm short axis. These are highly concerning for metastatic involvement. 2. Right middle lobe pulmonary nodules, stable 2016 and consistent with benign etiology. 3. 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 4. Aortic Atherosclerosis (ICD10-I70.0).   05/17/2020 - 06/26/2020 Radiation Therapy   1. Pelvis:  4500 cGy in 25 fractions 2. Rectal boost:  540 cGy in 3 fractions   05/21/2020 - 06/22/2020 Chemotherapy   Concurrent chemoradiation with infusional 5 FU       08/22/2020 Pathology Results   A. COLON, SIGMOID, RECTUM, ANUS, RESECTION:  -  Invasive colorectal adenocarcinoma, 3 cm.  - Carcinoma extends into perirectal connective tissue.  - Nineteen benign lymph nodes (0/19).  - Margins not involved.    08/31/2020 Cancer Staging   Staging form: Colon and Rectum, AJCC 8th Edition - Pathologic stage from 08/31/2020: Stage IIA (ypT3, pN0, cM0) - Signed by Dellia Beckwith, MD on 01/14/2021 Histopathologic type: Adenocarcinoma,  NOS Stage prefix: Post-therapy Response to neoadjuvant therapy: Partial response Total positive nodes: 0 Total nodes examined: 19 Histologic grading system: 4 grade system Histologic grade (G): G2 Residual tumor (R): R0 - None Tumor size (mm): 30 Lymph-vascular invasion (LVI): LVI not present (absent)/not identified Diagnostic confirmation: Positive histology PLUS positive immunophenotyping and/or positive genetic studies Specimen type: Excision Staged by: Managing physician Tumor deposits (TD): Absent Perineural invasion (PNI): Absent Stage used in treatment planning: Yes National guidelines used in treatment planning: Yes Type of national guideline used in treatment planning: NCCN Staging comments: Rec FOLFOX chemotherapy x 4 months   10/09/2020 - 02/14/2021 Chemotherapy   Patient is on Treatment Plan : COLORECTAL FOLFOX q14d x 4 months     Hypokalemia     INTERVAL HISTORY:  Tanya Mcclure is here for routine follow up of rectal adenocarcinoma  and states that she is doing well. She does report of nausea in the morning. She does report swelling and pain in the left side of her face. I have prescribed Cleocin antibiotic, 300 mg three times a day.She is going to [facility] for [facility]. Her  appetite is good, and she has gained 1 pound since her last visit.  Blood counts are normal. She denies fever, chills or other signs of infection.  She denies vomiting, bowel issues, or abdominal pain.  She denies sore throat, cough, dyspnea, or chest pain.  REVIEW OF SYSTEMS:  Review of Systems  Constitutional:  Positive for fatigue. Negative for appetite change, chills, fever and unexpected weight change.  HENT:  Negative.         Acute left sided dental infection.  Eyes: Negative.   Respiratory: Negative.  Negative for chest tightness, cough, hemoptysis, shortness of breath and wheezing.   Cardiovascular: Negative.  Negative for chest pain, leg swelling and palpitations.  Gastrointestinal:   Positive for nausea (intermittent). Negative for abdominal distention, abdominal pain, blood in stool, constipation, diarrhea and vomiting.  Endocrine: Negative.   Genitourinary: Negative.  Negative for difficulty urinating, dysuria, frequency and hematuria.   Musculoskeletal: Negative.  Negative for arthralgias, back pain, flank pain, gait problem and myalgias.  Skin: Negative.   Neurological:  Positive for numbness (neuropathy of the bilateral feet, grade 1). Negative for dizziness, extremity weakness, gait problem, headaches, light-headedness, seizures and speech difficulty.  Hematological: Negative.   Psychiatric/Behavioral: Negative.  Negative for depression and sleep disturbance. The patient is not nervous/anxious.      VITALS:  Blood pressure 133/73, pulse 78, temperature 98.2 F (36.8 C), temperature source Oral, resp. rate 18, height 5\' 3"  (1.6 m), weight 179 lb 11.2 oz (81.5 kg), SpO2 97 %.  Wt Readings from Last 3 Encounters:  12/24/21 179 lb 11.2 oz (81.5 kg)  09/24/21 178 lb 4 oz (80.9 kg)  09/23/21 179 lb 1.6 oz (81.2 kg)    Body mass index is 31.83 kg/m.  Performance status (ECOG): 1 - Symptomatic but completely ambulatory  PHYSICAL EXAM:  Physical Exam Constitutional:      General: She is not in acute distress.    Appearance: Normal appearance. She is normal weight.  HENT:  Head: Normocephalic and atraumatic.  Eyes:     General: No scleral icterus.    Extraocular Movements: Extraocular movements intact.     Conjunctiva/sclera: Conjunctivae normal.     Pupils: Pupils are equal, round, and reactive to light.  Cardiovascular:     Rate and Rhythm: Normal rate and regular rhythm.     Pulses: Normal pulses.     Heart sounds: Normal heart sounds. No murmur heard.    No friction rub. No gallop.  Pulmonary:     Effort: Pulmonary effort is normal. No respiratory distress.     Breath sounds: Normal breath sounds.  Abdominal:     General: Bowel sounds are normal.  There is no distension.     Palpations: Abdomen is soft. There is no hepatomegaly, splenomegaly or mass.     Tenderness: There is no abdominal tenderness.     Comments: Pink stoma, no bleeding. Brown soft stool in the colostomy bag of the left upper quadrant. Colostomy in left upper quadrant which is functioning normally.  Musculoskeletal:        General: Normal range of motion.     Cervical back: Normal range of motion and neck supple.     Right lower leg: No edema.     Left lower leg: No edema.  Lymphadenopathy:     Cervical: No cervical adenopathy.  Skin:    General: Skin is warm and dry.  Neurological:     General: No focal deficit present.     Mental Status: She is alert and oriented to person, place, and time. Mental status is at baseline.  Psychiatric:        Mood and Affect: Mood normal.        Behavior: Behavior normal.        Thought Content: Thought content normal.        Judgment: Judgment normal.     LABS:      Latest Ref Rng & Units 12/24/2021   12:00 AM 09/23/2021   12:00 AM 06/26/2021   12:00 AM  CBC  WBC  4.3     3.5     6.2   Hemoglobin 12.0 - 16.0 13.5     13.2     13.9   Hematocrit 36 - 46 40     39     41   Platelets 150 - 400 K/uL 247     244     223      This result is from an external source.      Latest Ref Rng & Units 12/24/2021   12:00 AM 09/23/2021   12:00 AM 06/26/2021   12:00 AM  CMP  BUN 4 - $R'21 15     14     15   'ZX$ Creatinine 0.5 - 1.1 0.6     0.6     0.6   Sodium 137 - 147 137     141     138   Potassium 3.5 - 5.1 mEq/L 4.1     3.8     3.8   Chloride 99 - 108 100     101     101   CO2 13 - $Re'22 31     28     26   'EkN$ Calcium 8.7 - 10.7 9.5     9.4     9.2   Alkaline Phos 25 - 125 72     67     99   AST 13 -  35 27     25     35   ALT 7 - 35 U/L $Remo'22     20     24      'gMnZm$ This result is from an external source.     Lab Results  Component Value Date   CEA1 1.5 12/24/2021   /  CEA  Date Value Ref Range Status  12/24/2021 1.5 0.0 - 4.7 ng/mL  Final    Comment:    (NOTE)                             Nonsmokers          <3.9                             Smokers             <5.6 [employer] Electrochemiluminescence Immunoassay (ECLIA) Values obtained with different assay methods or kits cannot be used interchangeably.  Results cannot be interpreted as absolute evidence of the presence or absence of malignant disease. Performed At: [facility] [place], [place] [id number] Rush Farmer MD DZ:[id number]      STUDIES:  No results found.  EXAM:07/22/2020 MRI PELVIS WITHOUT CONTRAST  FINDINGS: TUMOR LOCATION   Tumor distance from Anal Verge/Skin Surface:  4.0 cm   Tumor distance to Internal Anal Sphincter: 0 cm   TUMOR DESCRIPTION   Circumferential Extent: Tumor is likely circumferential, although more bulky residual tumor extends posteriorly from 2:30 through 9 o'clock (series 3/image 23).   Tumor Length: 5.8 cm   T - CATEGORY   Extension through Muscularis Propria: Yes>81mm=T3d, with gross perirectal extension extending from 3:30 to 6 o'clock measuring approximately 16-17 mm (series 3/images 22-23).   Shortest Distance of any tumor/node from Mesorectal Fascia: 0 mm, given left pelvic floor involvement (described below)   Extramural Vascular Invasion/Tumor Thrombus: No   Invasion of Anterior Peritoneal Reflection: No   Involvement of Adjacent Organs or Pelvic Sidewall: Yes=T4, tumor abuts and likely involves the left levator ani muscle (series 3/image 23)   Levator Ani Involvement: Yes   N - CATEGORY   Mesorectal Lymph Nodes >=73mm: 1-3=N1   Extra-mesorectal Lymphadenopathy: No   Other:  None.   IMPRESSION: 5.8 cm circumferential distal rectal tumor abutting the internal anal sphincter. Bulky residual tumor posteriorly with gross perirectal extension along the left posterior aspect, as described above, abutting/involving the left levator ani muscle.   Rectal  adenocarcinoma T stage: T4   Rectal adenocarcinoma N stage:  N1   Distance from tumor to the internal anal sphincter is 0 cm.  HISTORY:   Allergies:  Allergies  Allergen Reactions   Penicillins Rash    Current Medications: Current Outpatient Medications  Medication Sig Dispense Refill   levothyroxine (SYNTHROID) 100 MCG tablet Take by mouth.     metFORMIN (GLUCOPHAGE-XR) 500 MG 24 hr tablet Take 2 tablets by mouth daily with breakfast.     montelukast (SINGULAIR) 10 MG tablet Take 1 tablet by mouth daily.     Accu-Chek Softclix Lancets lancets USE TO CHECK BLOOD GLUCOSE TWICE DAILY.     albuterol (VENTOLIN HFA) 108 (90 Base) MCG/ACT inhaler Inhale 1-2 puffs into the lungs every 6 (six) hours as needed.     atorvastatin (LIPITOR) 40 MG tablet Take 40 mg by mouth daily.     Blood Glucose Monitoring Suppl (  ACCU-CHEK GUIDE ME) w/Device KIT See admin instructions.     Blood Glucose Monitoring Suppl (GLUCOCOM BLOOD GLUCOSE MONITOR) DEVI 1 each by Misc.(Non-Drug; Combo Route) route daily.     budesonide-formoterol (SYMBICORT) 160-4.5 MCG/ACT inhaler Inhale into the lungs.     celecoxib (CELEBREX) 100 MG capsule Take 100 mg by mouth 2 (two) times daily.     clindamycin (CLEOCIN) 300 MG capsule Take 1 capsule (300 mg total) by mouth 3 (three) times daily. 30 capsule 0   furosemide (LASIX) 20 MG tablet Take 1 tablet by mouth daily.     gabapentin (NEURONTIN) 400 MG capsule Take 400 mg by mouth 3 (three) times daily.     glucose blood (ACCU-CHEK AVIVA PLUS) test strip Check blood glucose twice daily and as needed. Dx E 11.65     ibuprofen (ADVIL) 800 MG tablet Take by mouth.     Lancets Misc. (ACCU-CHEK FASTCLIX LANCET) KIT Check blood glucose twice daily. Dx E 11.65     levocetirizine (XYZAL) 5 MG tablet Take 5 mg by mouth daily.     lisinopril (ZESTRIL) 5 MG tablet Take 5 mg by mouth daily.     ondansetron (ZOFRAN) 4 MG tablet Take 1 tablet (4 mg total) by mouth every 4 (four) hours as  needed for nausea. 90 tablet 3   OZEMPIC, 0.25 OR 0.5 MG/DOSE, 2 MG/3ML SOPN Inject into the skin.     pantoprazole (PROTONIX) 40 MG tablet Take 1 tablet (40 mg total) by mouth 2 (two) times daily. 180 tablet 3   prochlorperazine (COMPAZINE) 10 MG tablet Take 10 mg by mouth every 6 (six) hours as needed.     Current Facility-Administered Medications  Medication Dose Route Frequency Provider Last Rate Last Admin   triamcinolone acetonide (KENALOG) 10 MG/ML injection 10 mg  10 mg Other Once Landis Martins, DPM       triamcinolone acetonide (KENALOG-40) injection 20 mg  20 mg Other Once Landis Martins, DPM          I,Gabriella Ballesteros,acting as a scribe for Derwood Kaplan, MD.,have documented all relevant documentation on the behalf of Derwood Kaplan, MD,as directed by  Derwood Kaplan, MD while in the presence of Derwood Kaplan, MD.   I have reviewed this report as typed by the medical scribe, and it is complete and accurate.

## 2021-12-24 ENCOUNTER — Inpatient Hospital Stay (INDEPENDENT_AMBULATORY_CARE_PROVIDER_SITE_OTHER): Payer: Medicaid Other | Admitting: Oncology

## 2021-12-24 ENCOUNTER — Encounter: Payer: Self-pay | Admitting: Oncology

## 2021-12-24 ENCOUNTER — Other Ambulatory Visit: Payer: Self-pay | Admitting: Oncology

## 2021-12-24 ENCOUNTER — Inpatient Hospital Stay: Payer: Medicaid Other | Attending: Oncology

## 2021-12-24 ENCOUNTER — Telehealth: Payer: Self-pay | Admitting: Oncology

## 2021-12-24 VITALS — BP 133/73 | HR 78 | Temp 98.2°F | Resp 18 | Ht 63.0 in | Wt 179.7 lb

## 2021-12-24 DIAGNOSIS — Z9221 Personal history of antineoplastic chemotherapy: Secondary | ICD-10-CM | POA: Insufficient documentation

## 2021-12-24 DIAGNOSIS — C20 Malignant neoplasm of rectum: Secondary | ICD-10-CM

## 2021-12-24 DIAGNOSIS — Z923 Personal history of irradiation: Secondary | ICD-10-CM | POA: Insufficient documentation

## 2021-12-24 DIAGNOSIS — G629 Polyneuropathy, unspecified: Secondary | ICD-10-CM | POA: Insufficient documentation

## 2021-12-24 DIAGNOSIS — K047 Periapical abscess without sinus: Secondary | ICD-10-CM | POA: Insufficient documentation

## 2021-12-24 DIAGNOSIS — Z85048 Personal history of other malignant neoplasm of rectum, rectosigmoid junction, and anus: Secondary | ICD-10-CM | POA: Insufficient documentation

## 2021-12-24 LAB — CBC AND DIFFERENTIAL
HCT: 40 (ref 36–46)
Hemoglobin: 13.5 (ref 12.0–16.0)
Neutrophils Absolute: 2.92
Platelets: 247 10*3/uL (ref 150–400)
WBC: 4.3

## 2021-12-24 LAB — BASIC METABOLIC PANEL
BUN: 15 (ref 4–21)
CO2: 31 — AB (ref 13–22)
Chloride: 100 (ref 99–108)
Creatinine: 0.6 (ref 0.5–1.1)
Glucose: 159
Potassium: 4.1 mEq/L (ref 3.5–5.1)
Sodium: 137 (ref 137–147)

## 2021-12-24 LAB — HEPATIC FUNCTION PANEL
ALT: 22 U/L (ref 7–35)
AST: 27 (ref 13–35)
Alkaline Phosphatase: 72 (ref 25–125)
Bilirubin, Total: 0.7

## 2021-12-24 LAB — COMPREHENSIVE METABOLIC PANEL
Albumin: 4.5 (ref 3.5–5.0)
Calcium: 9.5 (ref 8.7–10.7)

## 2021-12-24 LAB — CBC: RBC: 4.61 (ref 3.87–5.11)

## 2021-12-24 MED ORDER — CLINDAMYCIN HCL 300 MG PO CAPS: 300.0000 mg | ORAL_CAPSULE | Freq: Three times a day (TID) | ORAL | 0 refills | Status: DC

## 2021-12-24 NOTE — Telephone Encounter (Signed)
Per 12/24/21 los next appt scheduled and confirmed with patient

## 2021-12-25 LAB — CEA: CEA: 1.5 ng/mL (ref 0.0–4.7)

## 2022-01-15 ENCOUNTER — Encounter: Payer: Self-pay | Admitting: Hematology and Oncology

## 2022-03-21 ENCOUNTER — Encounter: Payer: Self-pay | Admitting: Hematology and Oncology

## 2022-03-21 NOTE — Telephone Encounter (Signed)
No additional notes

## 2022-03-26 ENCOUNTER — Inpatient Hospital Stay: Payer: Medicaid Other | Attending: Oncology | Admitting: Oncology

## 2022-03-26 ENCOUNTER — Inpatient Hospital Stay: Payer: Medicaid Other

## 2022-03-26 ENCOUNTER — Other Ambulatory Visit: Payer: Self-pay | Admitting: Oncology

## 2022-03-26 ENCOUNTER — Telehealth: Payer: Self-pay | Admitting: Oncology

## 2022-03-26 ENCOUNTER — Other Ambulatory Visit: Payer: Self-pay

## 2022-03-26 ENCOUNTER — Encounter: Payer: Self-pay | Admitting: Oncology

## 2022-03-26 VITALS — BP 141/76 | HR 73 | Temp 98.0°F | Resp 18 | Ht 63.0 in | Wt 188.8 lb

## 2022-03-26 DIAGNOSIS — C20 Malignant neoplasm of rectum: Secondary | ICD-10-CM

## 2022-03-26 DIAGNOSIS — G629 Polyneuropathy, unspecified: Secondary | ICD-10-CM

## 2022-03-26 DIAGNOSIS — G620 Drug-induced polyneuropathy: Secondary | ICD-10-CM

## 2022-03-26 DIAGNOSIS — Z85048 Personal history of other malignant neoplasm of rectum, rectosigmoid junction, and anus: Secondary | ICD-10-CM | POA: Insufficient documentation

## 2022-03-26 DIAGNOSIS — E876 Hypokalemia: Secondary | ICD-10-CM | POA: Diagnosis not present

## 2022-03-26 LAB — CBC WITH DIFFERENTIAL (CANCER CENTER ONLY)
Abs Immature Granulocytes: 0.06 10*3/uL (ref 0.00–0.07)
Basophils Absolute: 0.1 10*3/uL (ref 0.0–0.1)
Basophils Relative: 1 %
Eosinophils Absolute: 0.2 10*3/uL (ref 0.0–0.5)
Eosinophils Relative: 4 %
HCT: 40.2 % (ref 36.0–46.0)
Hemoglobin: 13.4 g/dL (ref 12.0–15.0)
Immature Granulocytes: 1 %
Lymphocytes Relative: 19 %
Lymphs Abs: 0.8 10*3/uL (ref 0.7–4.0)
MCH: 29.1 pg (ref 26.0–34.0)
MCHC: 33.3 g/dL (ref 30.0–36.0)
MCV: 87.4 fL (ref 80.0–100.0)
Monocytes Absolute: 0.4 10*3/uL (ref 0.1–1.0)
Monocytes Relative: 10 %
Neutro Abs: 2.7 10*3/uL (ref 1.7–7.7)
Neutrophils Relative %: 65 %
Platelet Count: 212 10*3/uL (ref 150–400)
RBC: 4.6 MIL/uL (ref 3.87–5.11)
RDW: 12.7 % (ref 11.5–15.5)
WBC Count: 4.3 10*3/uL (ref 4.0–10.5)
nRBC: 0 % (ref 0.0–0.2)

## 2022-03-26 LAB — CMP (CANCER CENTER ONLY)
ALT: 17 U/L (ref 0–44)
AST: 18 U/L (ref 15–41)
Albumin: 4.1 g/dL (ref 3.5–5.0)
Alkaline Phosphatase: 51 U/L (ref 38–126)
Anion gap: 7 (ref 5–15)
BUN: 13 mg/dL (ref 8–23)
CO2: 28 mmol/L (ref 22–32)
Calcium: 9 mg/dL (ref 8.9–10.3)
Chloride: 100 mmol/L (ref 98–111)
Creatinine: 0.84 mg/dL (ref 0.44–1.00)
GFR, Estimated: >60 mL/min (ref >60)
Glucose, Bld: 274 mg/dL — ABNORMAL HIGH (ref 70–99)
Potassium: 3.8 mmol/L (ref 3.5–5.1)
Sodium: 135 mmol/L (ref 135–145)
Total Bilirubin: 0.6 mg/dL (ref 0.3–1.2)
Total Protein: 7.7 g/dL (ref 6.5–8.1)

## 2022-03-26 MED ORDER — SODIUM CHLORIDE 0.9% FLUSH
10.0000 mL | INTRAVENOUS | Status: DC | PRN
  Administered 2022-03-26: 10 mL

## 2022-03-26 MED ORDER — HEPARIN SOD (PORK) LOCK FLUSH 100 UNIT/ML IV SOLN
500.0000 [IU] | Freq: Once | INTRAVENOUS | Status: AC | PRN
  Administered 2022-03-26: 500 [IU]

## 2022-03-26 NOTE — Progress Notes (Signed)
[facility]  [address] [place],  [place]  [address] [phone]  Clinic Day:  03/26/2022  Referring physician: Garnetta Buddy I, NP    ASSESSMENT & PLAN:   Assessment: 1.  Clinical stage IIIB ([id number]) rectal cancer, diagnosed in December 2021. CT revealed several small perirectal and presacral lymph nodes, which were highly concerning for metastatic involvement. She underwent abdominoperitoneal resection in April 2022 after neoadjuvant chemoradiation and lymph nodes were negative. She completed 8 cycles of adjuvant FOLFOX chemotherapy in early October 2022.  She remains without evidence of recurrence.  2. 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 Her hands are doing better.   Plan: We will see her back in 3 months with CBC, CMP and CEA for repeat evaluation and port flush. We will flush her port today. She is due for colonoscopy and we will ask Dr. Melina Copa for his recommendation. She is due for annual mammogram and I will get that scheduled.  She verbalizes understanding of and agreement to the plans discussed today. She knows to call the office should any new questions or concerns arise.   I provided 15 minutes of face-to-face time during this this encounter and > 50% was spent counseling as documented under my assessment and plan.    [facility] [address] [place] [address] Dept: [phone] Dept Fax: [phone]   No orders of the defined types were placed in this encounter.    CHIEF COMPLAINT:  CC:  Stage IIIB rectal cancer   Current Treatment:  Surveillance   HISTORY OF PRESENT ILLNESS:   Oncology History  Rectal cancer ([place])  04/20/2020 Procedure   COLONOSCOPY: Rectal mass beginning at about 2 cm from the anal verge and was noted to obstruct 50% of the circumference of the rectum.  Benign polyps were also present and removed.    04/20/2020 Pathology Results   Rectum, biopsy:  -  Invasive adenocarcinoma    04/22/2020 Cancer Staging   Staging form: Colon and Rectum, AJCC 8th Edition - Clinical stage from 04/22/2020: Stage IIIB (cT3, cN1b, cM0) - Signed by Derwood Kaplan, MD on 06/04/2020   04/23/2020 Initial Diagnosis   Rectal cancer ([place])   04/24/2020 Imaging   CT ABDOMEN AND PELVIS WITH CONTRAST: 1. Known rectal lesion not well demonstrated by CT. There are several small perirectal and presacral lymph nodes measuring up to 8 mm short axis. These are highly concerning for metastatic involvement. 2. Right middle lobe pulmonary nodules, stable 2016 and consistent with benign etiology. 3. 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 4. Aortic Atherosclerosis (ICD10-I70.0).   05/17/2020 - 06/26/2020 Radiation Therapy   1. Pelvis:  4500 cGy in 25 fractions 2. Rectal boost:  540 cGy in 3 fractions   05/21/2020 - 06/22/2020 Chemotherapy   Concurrent chemoradiation with infusional 5 FU       08/22/2020 Pathology Results   A. COLON, SIGMOID, RECTUM, ANUS, RESECTION:  - Invasive colorectal adenocarcinoma, 3 cm.  - Carcinoma extends into perirectal connective tissue.  - Nineteen benign lymph nodes (0/19).  - Margins not involved.    08/31/2020 Cancer Staging   Staging form: Colon and Rectum, AJCC 8th Edition - Pathologic stage from 08/31/2020: Stage IIA (ypT3, pN0, cM0) - Signed by Derwood Kaplan, MD on 01/14/2021 Histopathologic type: Adenocarcinoma, NOS Stage prefix: Post-therapy Response to neoadjuvant therapy: Partial response Total positive nodes: 0 Total nodes  examined: 19 Histologic grading system: 4 grade system Histologic grade (G): G2 Residual tumor (R): R0 - None Tumor size (mm): 30 Lymph-vascular invasion (LVI): LVI not present (absent)/not identified Diagnostic confirmation: Positive histology PLUS positive immunophenotyping and/or positive genetic  studies Specimen type: Excision Staged by: Managing physician Tumor deposits (TD): Absent Perineural invasion (PNI): Absent Stage used in treatment planning: Yes National guidelines used in treatment planning: Yes Type of national guideline used in treatment planning: NCCN Staging comments: Rec FOLFOX chemotherapy x 4 months   10/09/2020 - 02/14/2021 Chemotherapy   Patient is on Treatment Plan : COLORECTAL FOLFOX q14d x 4 months     Hypokalemia     INTERVAL HISTORY:  Tanya Mcclure is here for routine follow up of rectal adenocarcinoma  and states that she is doing well. She has been getting injections of her hip and back. Her neuropathy persists but her hands are better. She has not had her annual mammogram this year and agrees to let me schedule that. She is also due for colonoscopy and I will ask Dr. Melina Copa for his recommendations. Her  appetite is good, and she has gained 9 pounds since her last visit.  Blood counts are normal.Labs are pending. She denies fever, chills or other signs of infection.  She denies vomiting, bowel issues, or abdominal pain.  She denies sore throat, cough, dyspnea, or chest pain.  REVIEW OF SYSTEMS:  Review of Systems  Constitutional:  Positive for fatigue. Negative for appetite change, chills, fever and unexpected weight change.  HENT:  Negative.    Eyes: Negative.   Respiratory: Negative.  Negative for chest tightness, cough, hemoptysis, shortness of breath and wheezing.   Cardiovascular: Negative.  Negative for chest pain, leg swelling and palpitations.  Gastrointestinal:  Positive for nausea (intermittent). Negative for abdominal distention, abdominal pain, blood in stool, constipation, diarrhea and vomiting.  Endocrine: Negative.   Genitourinary: Negative.  Negative for difficulty urinating, dysuria, frequency and hematuria.   Musculoskeletal: Negative.  Negative for arthralgias, back pain, flank pain, gait problem and myalgias.  Skin: Negative.   Neurological:   Positive for numbness (neuropathy of the bilateral feet, grade 1). Negative for dizziness, extremity weakness, gait problem, headaches, light-headedness, seizures and speech difficulty.  Hematological: Negative.   Psychiatric/Behavioral: Negative.  Negative for depression and sleep disturbance. The patient is not nervous/anxious.      VITALS:  Blood pressure (!) 141/76, pulse 73, temperature 98 F (36.7 C), temperature source Oral, resp. rate 18, height _0  (1.6 m), weight 188 lb 12.8 oz (85.6 kg), SpO2 98 %.  Wt Readings from Last 3 Encounters:  03/26/22 188 lb 12.8 oz (85.6 kg)  12/24/21 179 lb 11.2 oz (81.5 kg)  09/24/21 178 lb 4 oz (80.9 kg)    Body mass index is 33.44 kg/m.  Performance status (ECOG): 1 - Symptomatic but completely ambulatory  PHYSICAL EXAM:  Physical Exam Constitutional:      General: She is not in acute distress.    Appearance: Normal appearance. She is normal weight.  HENT:     Head: Normocephalic and atraumatic.  Eyes:     General: No scleral icterus.    Extraocular Movements: Extraocular movements intact.     Conjunctiva/sclera: Conjunctivae normal.     Pupils: Pupils are equal, round, and reactive to light.  Cardiovascular:     Rate and Rhythm: Normal rate and regular rhythm.     Pulses: Normal pulses.     Heart sounds: Normal heart sounds. No murmur heard.  No friction rub. No gallop.  Pulmonary:     Effort: Pulmonary effort is normal. No respiratory distress.     Breath sounds: Normal breath sounds.  Abdominal:     General: Bowel sounds are normal. There is no distension.     Palpations: Abdomen is soft. There is no hepatomegaly, splenomegaly or mass.     Tenderness: There is no abdominal tenderness.     Comments: Pink stoma, no bleeding. Brown soft stool in the colostomy bag of the left upper quadrant. Colostomy in left upper quadrant which is functioning normally.  Musculoskeletal:        General: Normal range of motion.     Cervical  back: Normal range of motion and neck supple.     Right lower leg: No edema.     Left lower leg: No edema.  Lymphadenopathy:     Cervical: No cervical adenopathy.  Skin:    General: Skin is warm and dry.  Neurological:     General: No focal deficit present.     Mental Status: She is alert and oriented to person, place, and time. Mental status is at baseline.  Psychiatric:        Mood and Affect: Mood normal.        Behavior: Behavior normal.        Thought Content: Thought content normal.        Judgment: Judgment normal.    LABS:      Latest Ref Rng & Units 03/26/2022    8:43 AM 12/24/2021   12:00 AM 09/23/2021   12:00 AM  CBC  WBC 4.0 - 10.5 K/uL 4.3  4.3     3.5      Hemoglobin 12.0 - 15.0 g/dL 13.4  13.5     13.2      Hematocrit 36.0 - 46.0 % 40.2  40     39      Platelets 150 - 400 K/uL 212  247     244         This result is from an external source.      Latest Ref Rng & Units 03/26/2022    8:43 AM 12/24/2021   12:00 AM 09/23/2021   12:00 AM  CMP  Glucose 70 - 99 mg/dL 274     BUN 8 - 23 mg/dL _0 Creatinine 0.44 - 1.00 mg/dL 0.84  0.6     0.6      Sodium 135 - 145 mmol/L 135  137     141      Potassium 3.5 - 5.1 mmol/L 3.8  4.1     3.8      Chloride 98 - 111 mmol/L 100  100     101      CO2 22 - 32 mmol/L _1 Calcium 8.9 - 10.3 mg/dL 9.0  9.5     9.4      Total Protein 6.5 - 8.1 g/dL 7.7     Total Bilirubin 0.3 - 1.2 mg/dL 0.6     Alkaline Phos 38 - 126 U/L 51  72     67      AST 15 - 41 U/L _2 ALT 0 - 44 U/L 17  22     20  This result is from an external source.     Lab Results  Component Value Date   CEA1 1.9 03/26/2022   /  CEA  Date Value Ref Range Status  03/26/2022 1.9 0.0 - 4.7 ng/mL Final    Comment:    (NOTE)                             Nonsmokers          <3.9                             Smokers             <5.6 [employer] Electrochemiluminescence Immunoassay (ECLIA) Values  obtained with different assay methods or kits cannot be used interchangeably.  Results cannot be interpreted as absolute evidence of the presence or absence of malignant disease. Performed At: [facility] [place], [place] [id number] Rush Farmer MD XB:[id number]      STUDIES:  No results found.  EXAM:07/22/2020 MRI PELVIS WITHOUT CONTRAST  FINDINGS: TUMOR LOCATION   Tumor distance from Anal Verge/Skin Surface:  4.0 cm   Tumor distance to Internal Anal Sphincter: 0 cm   TUMOR DESCRIPTION   Circumferential Extent: Tumor is likely circumferential, although more bulky residual tumor extends posteriorly from 2:30 through 9 o'clock (series 3/image 23).   Tumor Length: 5.8 cm   T - CATEGORY   Extension through Muscularis Propria: Yes>77m=T3d, with gross perirectal extension extending from 3:30 to 6 o'clock measuring approximately 16-17 mm (series 3/images 22-23).   Shortest Distance of any tumor/node from Mesorectal Fascia: 0 mm, given left pelvic floor involvement (described below)   Extramural Vascular Invasion/Tumor Thrombus: No   Invasion of Anterior Peritoneal Reflection: No   Involvement of Adjacent Organs or Pelvic Sidewall: Yes=T4, tumor abuts and likely involves the left levator ani muscle (series 3/image 23)   Levator Ani Involvement: Yes   N - CATEGORY   Mesorectal Lymph Nodes >=578m 1-3=N1   Extra-mesorectal Lymphadenopathy: No   Other:  None.   IMPRESSION: 5.8 cm circumferential distal rectal tumor abutting the internal anal sphincter. Bulky residual tumor posteriorly with gross perirectal extension along the left posterior aspect, as described above, abutting/involving the left levator ani muscle.   Rectal adenocarcinoma T stage: T4   Rectal adenocarcinoma N stage:  N1   Distance from tumor to the internal anal sphincter is 0 cm.  HISTORY:   Allergies:  Allergies  Allergen Reactions  . Penicillins Rash     Current Medications: Current Outpatient Medications  Medication Sig Dispense Refill  . Accu-Chek Softclix Lancets lancets USE TO CHECK BLOOD GLUCOSE TWICE DAILY.    . Marland Kitchenlbuterol (VENTOLIN HFA) 108 (90 Base) MCG/ACT inhaler Inhale 1-2 puffs into the lungs every 6 (six) hours as needed.    . Marland Kitchentorvastatin (LIPITOR) 40 MG tablet Take 40 mg by mouth daily.    . Blood Glucose Monitoring Suppl (ACCU-CHEK GUIDE ME) w/Device KIT See admin instructions.    . Blood Glucose Monitoring Suppl (GLUCOCOM BLOOD GLUCOSE MONITOR) DEVI 1 each by Misc.(Non-Drug; Combo Route) route daily.    . budesonide-formoterol (SYMBICORT) 160-4.5 MCG/ACT inhaler Inhale into the lungs.    . celecoxib (CELEBREX) 100 MG capsule Take 100 mg by mouth 2 (two) times daily.    . clindamycin (CLEOCIN) 300 MG capsule Take 1 capsule (300 mg total) by mouth 3 (three) times daily.  30 capsule 0  . furosemide (LASIX) 20 MG tablet Take 1 tablet by mouth daily.    Marland Kitchen gabapentin (NEURONTIN) 400 MG capsule Take 400 mg by mouth 3 (three) times daily.    Marland Kitchen glucose blood (ACCU-CHEK AVIVA PLUS) test strip Check blood glucose twice daily and as needed. Dx E 11.65    . ibuprofen (ADVIL) 800 MG tablet Take by mouth.    . Lancets Misc. (ACCU-CHEK FASTCLIX LANCET) KIT Check blood glucose twice daily. Dx E 11.65    . levocetirizine (XYZAL) 5 MG tablet Take 5 mg by mouth daily.    Marland Kitchen levothyroxine (SYNTHROID) 100 MCG tablet Take by mouth.    Marland Kitchen lisinopril (ZESTRIL) 5 MG tablet Take 5 mg by mouth daily.    . metFORMIN (GLUCOPHAGE-XR) 500 MG 24 hr tablet Take 2 tablets by mouth daily with breakfast.    . montelukast (SINGULAIR) 10 MG tablet Take 1 tablet by mouth daily.    . ondansetron (ZOFRAN) 4 MG tablet Take 1 tablet (4 mg total) by mouth every 4 (four) hours as needed for nausea. 90 tablet 3  . OZEMPIC, 0.25 OR 0.5 MG/DOSE, 2 MG/3ML SOPN Inject into the skin.    . pantoprazole (PROTONIX) 40 MG tablet Take 1 tablet (40 mg total) by mouth 2 (two) times  daily. 180 tablet 3  . prochlorperazine (COMPAZINE) 10 MG tablet Take 10 mg by mouth every 6 (six) hours as needed.     Current Facility-Administered Medications  Medication Dose Route Frequency Provider Last Rate Last Admin  . sodium chloride flush (NS) 0.9 % injection 10 mL  10 mL Intracatheter PRN [place] Scrape A, NP   10 mL at 03/26/22 0946  . triamcinolone acetonide (KENALOG) 10 MG/ML injection 10 mg  10 mg Other Once [place], [place], DPM      . triamcinolone acetonide (KENALOG-40) injection 20 mg  20 mg Other Once Landis Martins, DPM          I,Gabriella Ballesteros,acting as a scribe for Derwood Kaplan, MD.,have documented all relevant documentation on the behalf of Derwood Kaplan, MD,as directed by  Derwood Kaplan, MD while in the presence of Derwood Kaplan, MD.   I have reviewed this report as typed by the medical scribe, and it is complete and accurate.

## 2022-03-26 NOTE — Progress Notes (Signed)
cbc

## 2022-03-26 NOTE — Telephone Encounter (Signed)
03/26/22 Next appt scheduled and confirmed with patient

## 2022-03-27 LAB — CEA: CEA: 1.9 ng/mL (ref 0.0–4.7)

## 2022-04-13 ENCOUNTER — Encounter: Payer: Self-pay | Admitting: Hematology and Oncology

## 2022-05-14 ENCOUNTER — Encounter: Payer: Self-pay | Admitting: Oncology

## 2022-06-24 NOTE — Progress Notes (Shared)
[place]  [address] [place],  [place]  [address] [phone]  Clinic Day:  06/24/22   Referring physician: Garnetta Buddy I, NP    ASSESSMENT & PLAN:   Assessment: 1.  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 CT revealed several small perirectal and presacral lymph nodes, which were highly concerning for metastatic involvement. She underwent abdominoperitoneal resection in April 2022 after neoadjuvant chemoradiation and lymph nodes were negative. She completed 8 cycles of adjuvant FOLFOX chemotherapy in early October 2022.  She remains without evidence of recurrence.  2. 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 Her hands are doing better.   Plan: We will see her back in 3 months with CBC, CMP and CEA for repeat evaluation and port flush. We will flush her port today. She is due for colonoscopy and we will ask Dr. Melina Copa for his recommendation. She is due for annual mammogram and I will get that scheduled.  She verbalizes understanding of and agreement to the plans discussed today. She knows to call the office should any new questions or concerns arise.   I provided 15 minutes of face-to-face time during this this encounter and > 50% was spent counseling as documented under my assessment and plan.    [facility] [address] [place] [address] Dept: [phone] Dept Fax: [phone]   No orders of the defined types were placed in this encounter.    CHIEF COMPLAINT:  CC:  Stage IIIB rectal cancer   Current Treatment:  Surveillance   HISTORY OF PRESENT ILLNESS:   Oncology History  Rectal cancer ([facility])  04/20/2020 Procedure   COLONOSCOPY: Rectal mass beginning at about 2 cm from the anal verge and was noted to obstruct 50% of the circumference of the rectum.  Benign polyps were also present and removed.    04/20/2020 Pathology Results   Rectum, biopsy:  -  Invasive adenocarcinoma    04/22/2020 Cancer Staging   Staging form: Colon and Rectum, AJCC 8th Edition - Clinical stage from 04/22/2020: Stage IIIB (cT3, cN1b, cM0) - Signed by Derwood Kaplan, MD on 06/04/2020   04/23/2020 Initial Diagnosis   Rectal cancer ([facility])   04/24/2020 Imaging   CT ABDOMEN AND PELVIS WITH CONTRAST: 1. Known rectal lesion not well demonstrated by CT. There are several small perirectal and presacral lymph nodes measuring up to 8 mm short axis. These are highly concerning for metastatic involvement. 2. Right middle lobe pulmonary nodules, stable 2016 and consistent with benign etiology. 3. 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 4. Aortic Atherosclerosis (ICD10-I70.0).   05/17/2020 - 06/26/2020 Radiation Therapy   1. Pelvis:  4500 cGy in 25 fractions 2. Rectal boost:  540 cGy in 3 fractions   05/21/2020 - 06/22/2020 Chemotherapy   Concurrent chemoradiation with infusional 5 FU       08/22/2020 Pathology Results   A. COLON, SIGMOID, RECTUM, ANUS, RESECTION:  - Invasive colorectal adenocarcinoma, 3 cm.  - Carcinoma extends into perirectal connective tissue.  - Nineteen benign lymph nodes (0/19).  - Margins not involved.    08/31/2020 Cancer Staging   Staging form: Colon and Rectum, AJCC 8th Edition - Pathologic stage from 08/31/2020: Stage IIA (ypT3, pN0, cM0) - Signed by Derwood Kaplan, MD on 01/14/2021 Histopathologic type: Adenocarcinoma, NOS Stage prefix: Post-therapy Response to neoadjuvant therapy: Partial response Total positive nodes: 0 Total  nodes examined: 19 Histologic grading system: 4 grade system Histologic grade (G): G2 Residual tumor (R): R0 - None Tumor size (mm): 30 Lymph-vascular invasion (LVI): LVI not present (absent)/not identified Diagnostic confirmation: Positive histology PLUS positive immunophenotyping and/or positive genetic  studies Specimen type: Excision Staged by: Managing physician Tumor deposits (TD): Absent Perineural invasion (PNI): Absent Stage used in treatment planning: Yes National guidelines used in treatment planning: Yes Type of national guideline used in treatment planning: NCCN Staging comments: Rec FOLFOX chemotherapy x 4 months   10/09/2020 - 02/14/2021 Chemotherapy   Patient is on Treatment Plan : COLORECTAL FOLFOX q14d x 4 months     Hypokalemia     INTERVAL HISTORY:  Jahliya is here for routine follow up of rectal adenocarcinoma  and states that she is doing well.       She has been getting injections of her hip and back.   Her neuropathy persists but her hands are better.   She has not had her annual mammogram this year and agrees to let me schedule that.   She is also due for colonoscopy and I will ask Dr. Melina Copa for his recommendations.   Her  appetite is good, and she has gained 9 pounds since her last visit.  Blood counts are normal.Labs are pending. She denies fever, chills or other signs of infection.  She denies vomiting, bowel issues, or abdominal pain.  She denies sore throat, cough, dyspnea, or chest pain.  REVIEW OF SYSTEMS:  Review of Systems  Constitutional:  Positive for fatigue. Negative for appetite change, chills, fever and unexpected weight change.  HENT:  Negative.    Eyes: Negative.   Respiratory: Negative.  Negative for chest tightness, cough, hemoptysis, shortness of breath and wheezing.   Cardiovascular: Negative.  Negative for chest pain, leg swelling and palpitations.  Gastrointestinal:  Positive for nausea (intermittent). Negative for abdominal distention, abdominal pain, blood in stool, constipation, diarrhea and vomiting.  Endocrine: Negative.   Genitourinary: Negative.  Negative for difficulty urinating, dysuria, frequency and hematuria.   Musculoskeletal: Negative.  Negative for arthralgias, back pain, flank pain, gait problem and myalgias.   Skin: Negative.   Neurological:  Positive for numbness (neuropathy of the bilateral feet, grade 1). Negative for dizziness, extremity weakness, gait problem, headaches, light-headedness, seizures and speech difficulty.  Hematological: Negative.   Psychiatric/Behavioral: Negative.  Negative for depression and sleep disturbance. The patient is not nervous/anxious.      VITALS:  There were no vitals taken for this visit.  Wt Readings from Last 3 Encounters:  03/26/22 188 lb 12.8 oz (85.6 kg)  12/24/21 179 lb 11.2 oz (81.5 kg)  09/24/21 178 lb 4 oz (80.9 kg)    There is no height or weight on file to calculate BMI.  Performance status (ECOG): 1 - Symptomatic but completely ambulatory  PHYSICAL EXAM:  Physical Exam Constitutional:      General: She is not in acute distress.    Appearance: Normal appearance. She is normal weight.  HENT:     Head: Normocephalic and atraumatic.  Eyes:     General: No scleral icterus.    Extraocular Movements: Extraocular movements intact.     Conjunctiva/sclera: Conjunctivae normal.     Pupils: Pupils are equal, round, and reactive to light.  Cardiovascular:     Rate and Rhythm: Normal rate and regular rhythm.     Pulses: Normal pulses.     Heart sounds: Normal heart sounds. No murmur heard.    No friction  rub. No gallop.  Pulmonary:     Effort: Pulmonary effort is normal. No respiratory distress.     Breath sounds: Normal breath sounds.  Abdominal:     General: Bowel sounds are normal. There is no distension.     Palpations: Abdomen is soft. There is no hepatomegaly, splenomegaly or mass.     Tenderness: There is no abdominal tenderness.     Comments: Pink stoma, no bleeding. Brown soft stool in the colostomy bag of the left upper quadrant. Colostomy in left upper quadrant which is functioning normally.  Musculoskeletal:        General: Normal range of motion.     Cervical back: Normal range of motion and neck supple.     Right lower leg: No  edema.     Left lower leg: No edema.  Lymphadenopathy:     Cervical: No cervical adenopathy.  Skin:    General: Skin is warm and dry.  Neurological:     General: No focal deficit present.     Mental Status: She is alert and oriented to person, place, and time. Mental status is at baseline.  Psychiatric:        Mood and Affect: Mood normal.        Behavior: Behavior normal.        Thought Content: Thought content normal.        Judgment: Judgment normal.     LABS:      Latest Ref Rng & Units 03/26/2022    8:43 AM 12/24/2021   12:00 AM 09/23/2021   12:00 AM  CBC  WBC 4.0 - 10.5 K/uL 4.3  4.3     3.5      Hemoglobin 12.0 - 15.0 g/dL 13.4  13.5     13.2      Hematocrit 36.0 - 46.0 % 40.2  40     39      Platelets 150 - 400 K/uL 212  247     244         This result is from an external source.       Latest Ref Rng & Units 03/26/2022    8:43 AM 12/24/2021   12:00 AM 09/23/2021   12:00 AM  CMP  Glucose 70 - 99 mg/dL 274     BUN 8 - 23 mg/dL 13  15     14      $ Creatinine 0.44 - 1.00 mg/dL 0.84  0.6     0.6      Sodium 135 - 145 mmol/L 135  137     141      Potassium 3.5 - 5.1 mmol/L 3.8  4.1     3.8      Chloride 98 - 111 mmol/L 100  100     101      CO2 22 - 32 mmol/L 28  31     28      $ Calcium 8.9 - 10.3 mg/dL 9.0  9.5     9.4      Total Protein 6.5 - 8.1 g/dL 7.7     Total Bilirubin 0.3 - 1.2 mg/dL 0.6     Alkaline Phos 38 - 126 U/L 51  72     67      AST 15 - 41 U/L 18  27     25      $ ALT 0 - 44 U/L 17  22     20  This result is from an external source.      Lab Results  Component Value Date   CEA1 1.9 03/26/2022   /  CEA  Date Value Ref Range Status  03/26/2022 1.9 0.0 - 4.7 ng/mL Final    Comment:    (NOTE)                             Nonsmokers          <3.9                             Smokers             <5.6 [employer] Electrochemiluminescence Immunoassay (ECLIA) Values obtained with different assay methods or kits cannot be used  interchangeably.  Results cannot be interpreted as absolute evidence of the presence or absence of malignant disease. Performed At: [facility] [place], [place] [id number] Rush Farmer MD [id number]      STUDIES:  No results found.  EXAM:04/28/22 DIGITAL SCREENING BILATERAL MAMMOGRAM WITH TOMO AND CAD IMPRESSION: No mammographic evidence of malignancy.   EXAM:07/22/2020 MRI PELVIS WITHOUT CONTRAST  FINDINGS: TUMOR LOCATION   Tumor distance from Anal Verge/Skin Surface:  4.0 cm   Tumor distance to Internal Anal Sphincter: 0 cm   TUMOR DESCRIPTION   Circumferential Extent: Tumor is likely circumferential, although more bulky residual tumor extends posteriorly from 2:30 through 9 o'clock (series 3/image 23).   Tumor Length: 5.8 cm   T - CATEGORY   Extension through Muscularis Propria: Yes>66m=T3d, with gross perirectal extension extending from 3:30 to 6 o'clock measuring approximately 16-17 mm (series 3/images 22-23).   Shortest Distance of any tumor/node from Mesorectal Fascia: 0 mm, given left pelvic floor involvement (described below)   Extramural Vascular Invasion/Tumor Thrombus: No   Invasion of Anterior Peritoneal Reflection: No   Involvement of Adjacent Organs or Pelvic Sidewall: Yes=T4, tumor abuts and likely involves the left levator ani muscle (series 3/image 23)   Levator Ani Involvement: Yes   N - CATEGORY   Mesorectal Lymph Nodes >=564m 1-3=N1   Extra-mesorectal Lymphadenopathy: No   Other:  None.   IMPRESSION: 5.8 cm circumferential distal rectal tumor abutting the internal anal sphincter. Bulky residual tumor posteriorly with gross perirectal extension along the left posterior aspect, as described above, abutting/involving the left levator ani muscle.   Rectal adenocarcinoma T stage: T4   Rectal adenocarcinoma N stage:  N1   Distance from tumor to the internal anal sphincter is 0 cm.  HISTORY:    Allergies:  Allergies  Allergen Reactions   Penicillins Rash    Current Medications: Current Outpatient Medications  Medication Sig Dispense Refill   Accu-Chek Softclix Lancets lancets USE TO CHECK BLOOD GLUCOSE TWICE DAILY.     albuterol (VENTOLIN HFA) 108 (90 Base) MCG/ACT inhaler Inhale 1-2 puffs into the lungs every 6 (six) hours as needed.     atorvastatin (LIPITOR) 40 MG tablet Take 40 mg by mouth daily.     Blood Glucose Monitoring Suppl (ACCU-CHEK GUIDE ME) w/Device KIT See admin instructions.     Blood Glucose Monitoring Suppl (GLUCOCOM BLOOD GLUCOSE MONITOR) DEVI 1 each by Misc.(Non-Drug; Combo Route) route daily.     budesonide-formoterol (SYMBICORT) 160-4.5 MCG/ACT inhaler Inhale into the lungs.     celecoxib (CELEBREX) 100 MG capsule Take 100 mg by mouth 2 (two) times daily.  clindamycin (CLEOCIN) 300 MG capsule Take 1 capsule (300 mg total) by mouth 3 (three) times daily. 30 capsule 0   furosemide (LASIX) 20 MG tablet Take 1 tablet by mouth daily.     gabapentin (NEURONTIN) 400 MG capsule Take 400 mg by mouth 3 (three) times daily.     glucose blood (ACCU-CHEK AVIVA PLUS) test strip Check blood glucose twice daily and as needed. Dx E 11.65     ibuprofen (ADVIL) 800 MG tablet Take by mouth.     Lancets Misc. (ACCU-CHEK FASTCLIX LANCET) KIT Check blood glucose twice daily. Dx E 11.65     levocetirizine (XYZAL) 5 MG tablet Take 5 mg by mouth daily.     levothyroxine (SYNTHROID) 100 MCG tablet Take by mouth.     lisinopril (ZESTRIL) 5 MG tablet Take 5 mg by mouth daily.     metFORMIN (GLUCOPHAGE-XR) 500 MG 24 hr tablet Take 2 tablets by mouth daily with breakfast.     montelukast (SINGULAIR) 10 MG tablet Take 1 tablet by mouth daily.     ondansetron (ZOFRAN) 4 MG tablet Take 1 tablet (4 mg total) by mouth every 4 (four) hours as needed for nausea. 90 tablet 3   OZEMPIC, 0.25 OR 0.5 MG/DOSE, 2 MG/3ML SOPN Inject into the skin.     pantoprazole (PROTONIX) 40 MG tablet Take  1 tablet (40 mg total) by mouth 2 (two) times daily. 180 tablet 3   prochlorperazine (COMPAZINE) 10 MG tablet Take 10 mg by mouth every 6 (six) hours as needed.     Current Facility-Administered Medications  Medication Dose Route Frequency Provider Last Rate Last Admin   triamcinolone acetonide (KENALOG) 10 MG/ML injection 10 mg  10 mg Other Once Landis Martins, DPM       triamcinolone acetonide (KENALOG-40) injection 20 mg  20 mg Other Once Landis Martins, DPM          I,Gabriella Ballesteros,acting as a scribe for Derwood Kaplan, MD.,have documented all relevant documentation on the behalf of Derwood Kaplan, MD,as directed by  Derwood Kaplan, MD while in the presence of Derwood Kaplan, MD.   I have reviewed this report as typed by the medical scribe, and it is complete and accurate.

## 2022-06-26 ENCOUNTER — Telehealth: Payer: Self-pay

## 2022-06-26 ENCOUNTER — Inpatient Hospital Stay: Payer: Medicaid Other

## 2022-06-26 ENCOUNTER — Encounter: Payer: Self-pay | Admitting: Hematology and Oncology

## 2022-06-26 ENCOUNTER — Telehealth: Payer: Self-pay | Admitting: Hematology and Oncology

## 2022-06-26 ENCOUNTER — Inpatient Hospital Stay: Payer: Medicaid Other | Attending: Oncology | Admitting: Hematology and Oncology

## 2022-06-26 VITALS — BP 137/74 | HR 73 | Temp 98.4°F | Resp 20 | Ht 63.0 in | Wt 192.8 lb

## 2022-06-26 DIAGNOSIS — G620 Drug-induced polyneuropathy: Secondary | ICD-10-CM | POA: Insufficient documentation

## 2022-06-26 DIAGNOSIS — E119 Type 2 diabetes mellitus without complications: Secondary | ICD-10-CM | POA: Insufficient documentation

## 2022-06-26 DIAGNOSIS — C19 Malignant neoplasm of rectosigmoid junction: Secondary | ICD-10-CM | POA: Insufficient documentation

## 2022-06-26 DIAGNOSIS — R110 Nausea: Secondary | ICD-10-CM | POA: Insufficient documentation

## 2022-06-26 DIAGNOSIS — I10 Essential (primary) hypertension: Secondary | ICD-10-CM | POA: Diagnosis not present

## 2022-06-26 DIAGNOSIS — C20 Malignant neoplasm of rectum: Secondary | ICD-10-CM

## 2022-06-26 DIAGNOSIS — T451X5A Adverse effect of antineoplastic and immunosuppressive drugs, initial encounter: Secondary | ICD-10-CM | POA: Insufficient documentation

## 2022-06-26 DIAGNOSIS — Z79899 Other long term (current) drug therapy: Secondary | ICD-10-CM | POA: Insufficient documentation

## 2022-06-26 LAB — CBC WITH DIFFERENTIAL (CANCER CENTER ONLY)
Abs Immature Granulocytes: 0.05 10*3/uL (ref 0.00–0.07)
Basophils Absolute: 0.1 10*3/uL (ref 0.0–0.1)
Basophils Relative: 1 %
Eosinophils Absolute: 0.1 10*3/uL (ref 0.0–0.5)
Eosinophils Relative: 2 %
HCT: 41.6 % (ref 36.0–46.0)
Hemoglobin: 13.7 g/dL (ref 12.0–15.0)
Immature Granulocytes: 1 %
Lymphocytes Relative: 21 %
Lymphs Abs: 1 10*3/uL (ref 0.7–4.0)
MCH: 28.5 pg (ref 26.0–34.0)
MCHC: 32.9 g/dL (ref 30.0–36.0)
MCV: 86.5 fL (ref 80.0–100.0)
Monocytes Absolute: 0.5 10*3/uL (ref 0.1–1.0)
Monocytes Relative: 10 %
Neutro Abs: 3.1 10*3/uL (ref 1.7–7.7)
Neutrophils Relative %: 65 %
Platelet Count: 244 10*3/uL (ref 150–400)
RBC: 4.81 MIL/uL (ref 3.87–5.11)
RDW: 12.7 % (ref 11.5–15.5)
WBC Count: 4.8 10*3/uL (ref 4.0–10.5)
nRBC: 0 % (ref 0.0–0.2)

## 2022-06-26 LAB — CMP (CANCER CENTER ONLY)
ALT: 24 U/L (ref 0–44)
AST: 27 U/L (ref 15–41)
Albumin: 4.1 g/dL (ref 3.5–5.0)
Alkaline Phosphatase: 47 U/L (ref 38–126)
Anion gap: 10 (ref 5–15)
BUN: 12 mg/dL (ref 8–23)
CO2: 26 mmol/L (ref 22–32)
Calcium: 9.1 mg/dL (ref 8.9–10.3)
Chloride: 102 mmol/L (ref 98–111)
Creatinine: 0.87 mg/dL (ref 0.44–1.00)
GFR, Estimated: >60 mL/min (ref >60)
Glucose, Bld: 206 mg/dL — ABNORMAL HIGH (ref 70–99)
Potassium: 3.8 mmol/L (ref 3.5–5.1)
Sodium: 138 mmol/L (ref 135–145)
Total Bilirubin: 0.5 mg/dL (ref 0.3–1.2)
Total Protein: 8 g/dL (ref 6.5–8.1)

## 2022-06-26 MED ORDER — ONDANSETRON HCL 4 MG PO TABS: 4.0000 mg | ORAL_TABLET | ORAL | 0 refills | Status: DC | PRN

## 2022-06-26 NOTE — Assessment & Plan Note (Addendum)
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 CT revealed several small perirectal and presacral lymph nodes, which were highly concerning for metastatic involvement. She received neoadjuvant chemotherapy followed by abdominoperitoneal resection in April 2022 and lymph nodes were negative. She completed 8 cycles of adjuvant FOLFOX chemotherapy in early October 2022.  She remains without evidence of recurrence.  She is overdue for colonoscopy and previously saw Dr. Melina Copa, and he is no longer doing procedures, so we will refer her another gastroenterologist.  We will plan to see her back in 4 months with a CBC, CMP and CEA.

## 2022-06-26 NOTE — Assessment & Plan Note (Signed)
Great neuropathy, which is stable.  She continues gabapentin 300 mg twice daily.

## 2022-06-26 NOTE — Assessment & Plan Note (Signed)
She continues to report intermittent nausea, for which she is using ondansetron as needed.  We may need to consider upper endoscopy when she goes for her colonoscopy.

## 2022-06-26 NOTE — Telephone Encounter (Signed)
-----   Message from Marvia Pickles, PA-C sent at 06/26/2022 11:04 AM EST ----- Patient missed last colonoscopy. She states Dr. Carmie End new partner is doing them now, if we can refer back to his office. Thanks

## 2022-06-26 NOTE — Progress Notes (Signed)
[place]  [address] [place],  [place]  [address] [phone]  Clinic Day:  06/26/2022  Referring physician: Garnetta Buddy I, NP  ASSESSMENT & PLAN:   Assessment & Plan: Rectal cancer [facility]) Clinical stage IIIB [id number]) rectal cancer, diagnosed in December 2021. CT revealed several small perirectal and presacral lymph nodes, which were highly concerning for metastatic involvement. She received neoadjuvant chemotherapy followed by abdominoperitoneal resection in April 2022 and lymph nodes were negative. She completed 8 cycles of adjuvant FOLFOX chemotherapy in early October 2022.  She remains without evidence of recurrence.  She is overdue for colonoscopy and previously saw Dr. Melina Mcclure, and he is no longer doing procedures, so we will refer her another gastroenterologist.  We will plan to see her back in 4 months with a CBC, CMP and CEA.  Neuropathy due to chemotherapeutic drug (HCC) Great neuropathy, which is stable.  She continues gabapentin 300 mg twice daily.  Nausea without vomiting She continues to report intermittent nausea, for which she is using ondansetron as needed.  We may need to consider upper endoscopy when she goes for her colonoscopy.   MOST form completed We reviewed each section of the medical orders for scope of treatment together and she decided on CPR and limited additional interventions, but not intubation or mechanical ventilation.  She desired antibiotics as indicated and IV fluids for trial period.  She does not desire tube feeding.  She named Tanya Mcclure her daughter as her representative.  The patient was given the original MOST form and a copy was made for her chart.  The patient understands the plans discussed today and is in agreement with them.  She knows to contact our office if she develops concerns prior to her next appointment.   Mcclure provided 30 minutes of face-to-face time during this encounter and > 50% was spent  counseling as documented under my assessment and plan.    Tanya Pickles, PA-C  [facility] [address] [place] [address] Dept: [phone] Dept Fax: [phone]   Orders Placed This Encounter  Procedures   Ambulatory referral to Social Work    Referral Priority:   Urgent    Referral Type:   Consultation    Referral Reason:   Specialty Services Required    Referred to Provider:   Adelene Amas, LCSW    Number of Visits Requested:   1      CHIEF COMPLAINT:  CC: Stage IIIB colon cancer  Current Treatment: Surveillance  HISTORY OF PRESENT ILLNESS:   Oncology History  Rectal cancer ([place])  04/20/2020 Procedure   COLONOSCOPY: Rectal mass beginning at about 2 cm from the anal verge and was noted to obstruct 50% of the circumference of the rectum.  Benign polyps were also present and removed.   04/20/2020 Pathology Results   Rectum, biopsy:  -  Invasive adenocarcinoma    04/22/2020 Cancer Staging   Staging form: Colon and Rectum, AJCC 8th Edition - Clinical stage from 04/22/2020: Stage IIIB (cT3, cN1b, cM0) - Signed by Tanya Kaplan, MD on 06/04/2020   04/23/2020 Initial Diagnosis   Rectal cancer ([place])   04/24/2020 Imaging   CT ABDOMEN AND PELVIS WITH CONTRAST: 1. Known rectal lesion not well demonstrated by CT. There are several small perirectal and presacral lymph nodes measuring up to 8 mm short axis. These are highly concerning for metastatic involvement. 2. Right middle lobe pulmonary nodules, stable  2016 and consistent with benign etiology. 3. 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 4. Aortic Atherosclerosis (ICD10-I70.0).   05/17/2020 - 06/26/2020 Radiation Therapy   1. Pelvis:  4500 cGy in 25 fractions 2. Rectal boost:  540 cGy in 3 fractions   05/21/2020 - 06/22/2020 Chemotherapy   Concurrent chemoradiation with infusional 5 FU       08/22/2020  Pathology Results   A. COLON, SIGMOID, RECTUM, ANUS, RESECTION:  - Invasive colorectal adenocarcinoma, 3 cm.  - Carcinoma extends into perirectal connective tissue.  - Nineteen benign lymph nodes (0/19).  - Margins not involved.    08/31/2020 Cancer Staging   Staging form: Colon and Rectum, AJCC 8th Edition - Pathologic stage from 08/31/2020: Stage IIA (ypT3, pN0, cM0) - Signed by Tanya Kaplan, MD on 01/14/2021 Histopathologic type: Adenocarcinoma, NOS Stage prefix: Post-therapy Response to neoadjuvant therapy: Partial response Total positive nodes: 0 Total nodes examined: 19 Histologic grading system: 4 grade system Histologic grade (G): G2 Residual tumor (R): R0 - None Tumor size (mm): 30 Lymph-vascular invasion (LVI): LVI not present (absent)/not identified Diagnostic confirmation: Positive histology PLUS positive immunophenotyping and/or positive genetic studies Specimen type: Excision Staged by: Managing physician Tumor deposits (TD): Absent Perineural invasion (PNI): Absent Stage used in treatment planning: Yes National guidelines used in treatment planning: Yes Type of national guideline used in treatment planning: NCCN Staging comments: Rec FOLFOX chemotherapy x 4 months   10/09/2020 - 02/14/2021 Chemotherapy   Patient is on Treatment Plan : COLORECTAL FOLFOX q14d x 4 months     Hypokalemia      INTERVAL HISTORY:  Tanya Mcclure is here today for repeat clinical assessment.  She denies any changes in her stool.  She denies melena or hematochezia.  She denies abdominal or rectal pain.  She reports intermittent nausea without vomiting for which she continues ondansetron as needed.  She requests a refill of this.  She denies diarrhea or constipation.  She denies fevers or chills. She denies pain. Her appetite is good. Her weight has increased 4 pounds over last 3 months .  Unfortunately, she lost her husband in January, and now her mother has had a severe stroke and is on a  ventilator.  She requested a DNR today, as she would like to outline her wishes for her family.  Mcclure advised her to consider a MOST form, as she is relatively healthy.  She is also interested in speaking with our licensed clinical social worker, so we will make that referral.  She had a port flush 3 months ago and wants to wait until her next visit to have it flushed.  REVIEW OF SYSTEMS:  Review of Systems  Constitutional:  Negative for appetite change, chills, fatigue, fever and unexpected weight change.  HENT:   Negative for lump/mass, mouth sores and sore throat.   Respiratory:  Negative for cough and shortness of breath.   Cardiovascular:  Negative for chest pain and leg swelling.  Gastrointestinal:  Positive for nausea. Negative for abdominal pain, constipation, diarrhea and vomiting.  Endocrine: Negative for hot flashes.  Genitourinary:  Negative for difficulty urinating, dysuria, frequency and hematuria.   Musculoskeletal:  Negative for arthralgias, back pain and myalgias.  Skin:  Negative for rash.  Neurological:  Negative for dizziness and headaches.  Hematological:  Negative for adenopathy. Does not bruise/bleed easily.  Psychiatric/Behavioral:  Negative for depression and sleep disturbance. The patient is not nervous/anxious.      VITALS:  Blood pressure 137/74, pulse  73, temperature 98.4 F (36.9 C), temperature source Oral, resp. rate 20, height '5\' 3"'$  (1.6 m), weight 192 lb 12.8 oz (87.5 kg), SpO2 100 %.  Wt Readings from Last 3 Encounters:  06/26/22 192 lb 12.8 oz (87.5 kg)  03/26/22 188 lb 12.8 oz (85.6 kg)  12/24/21 179 lb 11.2 oz (81.5 kg)    Body mass index is 34.15 kg/m.  Performance status (ECOG): 1 - Symptomatic but completely ambulatory  PHYSICAL EXAM:  Physical Exam Vitals and nursing note reviewed.  Constitutional:      General: She is not in acute distress.    Appearance: Normal appearance.  HENT:     Head: Normocephalic and atraumatic.     Mouth/Throat:      Mouth: Mucous membranes are moist.     Pharynx: Oropharynx is clear. No oropharyngeal exudate or posterior oropharyngeal erythema.  Eyes:     General: No scleral icterus.    Extraocular Movements: Extraocular movements intact.     Conjunctiva/sclera: Conjunctivae normal.     Pupils: Pupils are equal, round, and reactive to light.  Cardiovascular:     Rate and Rhythm: Normal rate and regular rhythm.     Heart sounds: Normal heart sounds. No murmur heard.    No friction rub. No gallop.  Pulmonary:     Effort: Pulmonary effort is normal.     Breath sounds: Normal breath sounds. No wheezing, rhonchi or rales.  Abdominal:     General: There is no distension.     Palpations: Abdomen is soft. There is no hepatomegaly, splenomegaly or mass.     Tenderness: There is no abdominal tenderness.  Musculoskeletal:        General: Normal range of motion.     Cervical back: Normal range of motion and neck supple. No tenderness.     Right lower leg: No edema.     Left lower leg: No edema.  Lymphadenopathy:     Cervical: No cervical adenopathy.     Upper Body:     Right upper body: No supraclavicular or axillary adenopathy.     Left upper body: No supraclavicular or axillary adenopathy.     Lower Body: No right inguinal adenopathy. No left inguinal adenopathy.  Skin:    General: Skin is warm and dry.     Coloration: Skin is not jaundiced.     Findings: No rash.  Neurological:     Mental Status: She is alert and oriented to person, place, and time.     Cranial Nerves: No cranial nerve deficit.  Psychiatric:        Mood and Affect: Mood normal.        Behavior: Behavior normal.        Thought Content: Thought content normal.     LABS:      Latest Ref Rng & Units 06/26/2022    9:55 AM 03/26/2022    8:43 AM 12/24/2021   12:00 AM  CBC  WBC 4.0 - 10.5 K/uL 4.8  4.3  4.3      Hemoglobin 12.0 - 15.0 g/dL 13.7  13.4  13.5      Hematocrit 36.0 - 46.0 % 41.6  40.2  40      Platelets 150 -  400 K/uL 244  212  247         This result is from an external source.      Latest Ref Rng & Units 06/26/2022    9:55 AM 03/26/2022    8:43  AM 12/24/2021   12:00 AM  CMP  Glucose 70 - 99 mg/dL 206  274    BUN 8 - 23 mg/dL '12  13  15      '$ Creatinine 0.44 - 1.00 mg/dL 0.87  0.84  0.6      Sodium 135 - 145 mmol/L 138  135  137      Potassium 3.5 - 5.1 mmol/L 3.8  3.8  4.1      Chloride 98 - 111 mmol/L 102  100  100      CO2 22 - 32 mmol/L '26  28  31      '$ Calcium 8.9 - 10.3 mg/dL 9.1  9.0  9.5      Total Protein 6.5 - 8.1 g/dL 8.0  7.7    Total Bilirubin 0.3 - 1.2 mg/dL 0.5  0.6    Alkaline Phos 38 - 126 U/L 47  51  72      AST 15 - 41 U/L '27  18  27      '$ ALT 0 - 44 U/L '24  17  22         '$ This result is from an external source.     Lab Results  Component Value Date   CEA1 1.9 03/26/2022   /  CEA  Date Value Ref Range Status  03/26/2022 1.9 0.0 - 4.7 ng/mL Final    Comment:    (NOTE)                             Nonsmokers          <3.9                             Smokers             <5.6 Roche [employer]ostics Electrochemiluminescence Immunoassay (ECLIA) Values obtained with different assay methods or kits cannot be used interchangeably.  Results cannot be interpreted as absolute evidence of the presence or absence of malignant disease. Performed At: [facility] [place], [place] [id number] [place] Farmer MD [id number]    No results found for: "PSA1" No results found for: "[id number]" No results found for: "CAN125"  No results found for: "TOTALPROTELP", "ALBUMINELP", "A1GS", "A2GS", "BETS", "BETA2SER", "GAMS", "MSPIKE", "SPEI" No results found for: "TIBC", "FERRITIN", "IRONPCTSAT" No results found for: "LDH"  STUDIES:  No results found.    HISTORY:   Past Medical History:  Diagnosis Date   Arthritis    Asthma    Cancer ([place]) 04/2020   colon   Diabetes mellitus without complication ([place])    Dyspnea    walking,   Family history of  adverse reaction to anesthesia    daughter PONV   GERD (gastroesophageal reflux disease)    Hypertension    Hypothyroidism    Thyroid disease     Past Surgical History:  Procedure Laterality Date   ABDOMINAL HYSTERECTOMY     due to fibroids   CHOLECYSTECTOMY     HYSTERECTOMY ABDOMINAL WITH SALPINGO-OOPHORECTOMY     POLYPECTOMY     PORTA CATH INSERTION     TUBAL LIGATION     XI ROBOTIC ASSISTED LOWER ANTERIOR RESECTION N/A 08/22/2020   Procedure: XI [place];  Surgeon: Leighton Ruff, MD;  Location: [facility];  Service: General;  Laterality: N/A;  4 HOURS    Family History  Problem Relation  Age of Onset   Diabetes Mother    Hypertension Mother    Heart disease Mother    Heart attack Mother    Diabetes Father    Hypertension Father    Diabetes Sister    COPD Sister     Social History:  reports that she has never smoked. She has never used smokeless tobacco. She reports that she does not currently use alcohol. She reports that she does not use drugs.The patient is alone today.  Allergies:  Allergies  Allergen Reactions   Penicillins Rash    Current Medications: Current Outpatient Medications  Medication Sig Dispense Refill   Accu-Chek Softclix Lancets lancets USE TO CHECK BLOOD GLUCOSE TWICE DAILY.     albuterol (VENTOLIN HFA) 108 (90 Base) MCG/ACT inhaler Inhale 1-2 puffs into the lungs every 6 (six) hours as needed.     atorvastatin (LIPITOR) 40 MG tablet Take 40 mg by mouth daily.     Blood Glucose Monitoring Suppl (ACCU-CHEK GUIDE ME) w/Device KIT See admin instructions.     Blood Glucose Monitoring Suppl (GLUCOCOM BLOOD GLUCOSE MONITOR) DEVI 1 each by Misc.(Non-Drug; Combo Route) route daily.     budesonide-formoterol (SYMBICORT) 160-4.5 MCG/ACT inhaler Inhale into the lungs.     celecoxib (CELEBREX) 100 MG capsule Take 100 mg by mouth 2 (two) times daily.     furosemide (LASIX) 20 MG tablet Take 1 tablet by mouth as needed. As needed  for swelling     gabapentin (NEURONTIN) 400 MG capsule Take 400 mg by mouth 3 (three) times daily.     glucose blood (ACCU-CHEK AVIVA PLUS) test strip Check blood glucose twice daily and as needed. Dx E 11.65     ibuprofen (ADVIL) 800 MG tablet Take by mouth.     Lancets Misc. (ACCU-CHEK FASTCLIX LANCET) KIT Check blood glucose twice daily. Dx E 11.65     levocetirizine (XYZAL) 5 MG tablet Take 5 mg by mouth daily. (Patient not taking: Reported on 06/26/2022)     levothyroxine (SYNTHROID) 100 MCG tablet Take by mouth.     lisinopril (ZESTRIL) 5 MG tablet Take 5 mg by mouth daily.     metFORMIN (GLUCOPHAGE-XR) 500 MG 24 hr tablet Take 2 tablets by mouth daily with breakfast.     montelukast (SINGULAIR) 10 MG tablet Take 1 tablet by mouth daily.     ondansetron (ZOFRAN) 4 MG tablet Take 1 tablet (4 mg total) by mouth every 4 (four) hours as needed for nausea. 90 tablet 0   OZEMPIC, 0.25 OR 0.5 MG/DOSE, 2 MG/3ML SOPN Inject into the skin.     pantoprazole (PROTONIX) 40 MG tablet Take 1 tablet (40 mg total) by mouth 2 (two) times daily. 180 tablet 3   prochlorperazine (COMPAZINE) 10 MG tablet Take 10 mg by mouth every 6 (six) hours as needed.     Current Facility-Administered Medications  Medication Dose Route Frequency Provider Last Rate Last Admin   triamcinolone acetonide (KENALOG) 10 MG/ML injection 10 mg  10 mg Other Once Landis Martins, DPM       triamcinolone acetonide (KENALOG-40) injection 20 mg  20 mg Other Once Landis Martins, DPM

## 2022-06-26 NOTE — Telephone Encounter (Signed)
Spoke with Dr Marisa Hua office he is no longer doing procedures, patient agreed to see Dr Lyda Jester , referral faxed.

## 2022-06-26 NOTE — Telephone Encounter (Signed)
06/26/22 Next appt scheduled and confirmed with patient

## 2022-06-27 ENCOUNTER — Other Ambulatory Visit: Payer: Self-pay | Admitting: Hematology and Oncology

## 2022-06-27 DIAGNOSIS — C20 Malignant neoplasm of rectum: Secondary | ICD-10-CM

## 2022-06-27 NOTE — Addendum Note (Signed)
Addended by: Daryel November on: 06/27/2022 09:00 AM   Modules accepted: Orders

## 2022-06-28 LAB — CEA: CEA: 2.3 ng/mL (ref 0.0–4.7)

## 2022-06-30 ENCOUNTER — Encounter: Payer: Self-pay | Admitting: Hematology and Oncology

## 2022-06-30 NOTE — Telephone Encounter (Signed)
Referral sent patient aware.

## 2022-07-02 NOTE — Progress Notes (Signed)
Called to speak with patient and provide resources for counseling. She stated that her husband passed away recently and her Mother passed away yesterday. She was not able to speak with me when I called and asked that I call her back tomorrow.

## 2022-07-03 ENCOUNTER — Telehealth: Payer: Self-pay

## 2022-07-03 NOTE — Telephone Encounter (Signed)
Patient made aware GI appt with Dr Lyda Jester is scheduled for 09/08/2022 at 1015 arrive 15 min early with insurace card and medication list.

## 2022-07-04 NOTE — Progress Notes (Signed)
Called to speak with patient, left her a voicemail asking that she return my call.

## 2022-07-08 ENCOUNTER — Encounter: Payer: Self-pay | Admitting: Licensed Clinical Social Worker

## 2022-07-08 NOTE — Progress Notes (Signed)
[facility]  Clinical Social Work was referred by medical provider for assessment of psychosocial needs.  Clinical Social Worker attempted to contact patient by phone  to offer support and assess for needs.  Csw was unable to leave voicemail, patient's mailbox is full.     FA   Adelene Amas, LCSW  Clinical Social Worker [place]        Patient is participating in a Managed Medicaid Plan:  Yes

## 2022-07-08 NOTE — Progress Notes (Signed)
Called to speak with patient regarding resources for counseling, reached her voicemail, could not leave a message mailbox was full.

## 2022-07-21 ENCOUNTER — Inpatient Hospital Stay: Payer: Medicaid Other | Attending: Oncology | Admitting: Licensed Clinical Social Worker

## 2022-07-21 DIAGNOSIS — C20 Malignant neoplasm of rectum: Secondary | ICD-10-CM

## 2022-07-21 NOTE — Progress Notes (Signed)
Tanya Mcclure  Initial Assessment   Tanya Mcclure is a 62 y.o. year old female contacted by phone. Clinical Social Mcclure was referred by medical provider for assessment of psychosocial needs.   SDOH (Social Determinants of Health) assessments performed: Yes SDOH Interventions    Flowsheet Row Clinical Support from 07/21/2022 in [facility] Interventions Other (Comment)  Tanya Mcclure to Tanya Mcclure Interventions Intervention Not Indicated  Transportation Interventions Intervention Not Indicated  Utilities Interventions Intervention Not Indicated  Alcohol Usage Interventions Intervention Not Indicated (Score <7)  Financial Strain Interventions Intervention Not Indicated  Physical Activity Interventions Intervention Not Indicated  Stress Interventions Provide Counseling  Social Connections Interventions Intervention Not Indicated       SDOH Screenings   Food Insecurity: Food Insecurity Present (07/21/2022)  Housing: Low Risk  (07/21/2022)  Transportation Needs: No Transportation Needs (07/21/2022)  Utilities: Not At Risk (07/21/2022)  Alcohol Screen: Low Risk  (07/21/2022)  Depression (PHQ2-9): Medium Risk (07/21/2022)  Financial Resource Strain: Medium Risk (07/21/2022)  Physical Activity: Inactive (07/21/2022)  Social Connections: Socially Isolated (07/21/2022)  Stress: Stress Concern Present (07/21/2022)  Tobacco Use: Low Risk  (06/26/2022)     Distress Screen completed: No     No data to display            Family/Social Information:  Housing Arrangement: patient lives with minor grandchildren, main contact   Tanya Mcclure [phone]  Family members/support persons in your life? Friends, Social worker, and Geophysical data processor concerns: no  Employment: Unemployed  .  Income source: Theatre stage manager Income Financial concerns: Yes, current concerns Type of concern: Care giving (Child care  or elder care services) and general financial concerns Food access concerns: no Religious or spiritual practice: Yes-Christian Services Currently in place:   Medicaid [employer]  Coping/ Adjustment to diagnosis: Patient understands treatment plan and what happens next? yes Concerns about diagnosis and/or treatment: How I will care for other members of my family, Losing my job and/or losing income, and Overwhelmed by information Patient reported stressors: Publishing rights manager, Haematologist, Childcare/ elder care, Depression, and Anxiety/ nervousness Hopes and/or priorities:  support Patient enjoys time with family/ friends Current coping skills/ strengths: Average or above average intelligence , Capable of independent living , Armed forces logistics/support/administrative officer , Motivation for treatment/growth , Religious Affiliation , Special hobby/interest , and Supportive family/friends     SUMMARY: Current SDOH Barriers:  Financial constraints related to limited income, Limited social support, and Mental Health Concerns   Clinical Social Mcclure Clinical Goal(s):  Patient will Mcclure with SW to address concerns related to anxiety and depression  Interventions: Discussed common feeling and emotions when being diagnosed with cancer, and the importance of support during treatment Informed patient of the support team roles and support services at [facility] Provided CSW contact information and encouraged patient to call with any questions or concerns Referred patient to Tax inspector and Provided patient with information about CSW role in patient care  and other available resources.   Follow Up Plan: CSW will see patient on 07/30/2022 @ 10:00AM Patient verbalizes understanding of plan: Yes    Tanya Amas, LCSW   Patient is participating in a Managed Medicaid Plan:  Yes

## 2022-07-23 ENCOUNTER — Other Ambulatory Visit: Payer: Medicaid Other | Admitting: Licensed Clinical Social Worker

## 2022-07-30 ENCOUNTER — Other Ambulatory Visit: Payer: Medicaid Other | Admitting: Licensed Clinical Social Worker

## 2022-07-31 NOTE — Progress Notes (Signed)
Called to speak with patient, reached her voicemail, could not leave a message.

## 2022-09-08 ENCOUNTER — Encounter: Payer: Self-pay | Admitting: Hematology and Oncology

## 2022-10-21 ENCOUNTER — Encounter: Payer: Self-pay | Admitting: Hematology and Oncology

## 2022-10-28 ENCOUNTER — Inpatient Hospital Stay: Payer: Medicaid Other | Attending: Oncology

## 2022-10-28 ENCOUNTER — Ambulatory Visit: Payer: Medicaid Other | Admitting: Oncology

## 2022-11-25 NOTE — Progress Notes (Signed)
[facility]  [address] [place],  [place]  [address] [phone]  Clinic Day: 11/26/2022  Referring physician: Francee Piccolo I, NP    ASSESSMENT & PLAN:   Assessment: 1.  Clinical stage IIIB ([id number]) rectal cancer, diagnosed in December 2021. CT revealed several small perirectal and presacral lymph nodes, which were highly concerning for metastatic involvement. She underwent abdominoperitoneal resection in April 2022 after neoadjuvant chemoradiation and lymph nodes were negative. She completed 8 cycles of adjuvant FOLFOX chemotherapy in early October 2022.  She remains without evidence of recurrence.  2. 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  3. Gastritis. Her EGD in June of 2024 did show gastritis with some oozing of blood so I have advised her to stay on the Protonix 40 mg twice daily.  4. Otitis Externa. I will prescribe Cortisporin otic.   Plan: She had a colonoscopy done on 10/21/2022 which was normal, she also had a esophagogastroduodenoscopy which revealed some gastritis and a small hiatal hernia. She was placed on Protonix 40 mg twice a day by Dr Charm Barges in the past and I advised her to continue using it. She complained of an episode of a sharp ear pain on her right ear and also saw wax in the ear. She has serous otitis on the left side and otitis externa on the right. I prescribed cortisporin otic 2 drops in the right ear 4 times a day. She had her port flushed today. Her labs are pending today. She lost her husband in January this year and lost her mother a month after, so is appropriately grieving. I will see her  back in 4 months with CBC, CMP, CEA, and port flush. She wishes DNR status. She verbalizes understanding of and agreement to the plans discussed today. She knows to call the office should any new questions or concerns arise.    I provided 15 minutes of face-to-face time during this this encounter and >  50% was spent counseling as documented under my assessment and plan.    [facility] [address] [place] [address] Dept: [phone] Dept Fax: [phone]   No orders of the defined types were placed in this encounter.    CHIEF COMPLAINT:  CC:  Stage IIIB rectal cancer   Current Treatment:  Surveillance   HISTORY OF PRESENT ILLNESS:   Oncology History  Rectal cancer (HCC)  04/20/2020 Procedure   COLONOSCOPY: Rectal mass beginning at about 2 cm from the anal verge and was noted to obstruct 50% of the circumference of the rectum.  Benign polyps were also present and removed.   04/20/2020 Pathology Results   Rectum, biopsy:  -  Invasive adenocarcinoma    04/22/2020 Cancer Staging   Staging form: Colon and Rectum, AJCC 8th Edition - Clinical stage from 04/22/2020: Stage IIIB (cT3, cN1b, cM0) - Signed by Dellia Beckwith, MD on 06/04/2020   04/23/2020 Initial Diagnosis   Rectal cancer (HCC)   04/24/2020 Imaging   CT ABDOMEN AND PELVIS WITH CONTRAST: 1. Known rectal lesion not well demonstrated by CT. There are several small perirectal and presacral lymph nodes measuring up to 8 mm short axis. These are highly concerning for metastatic involvement. 2. Right middle lobe pulmonary nodules, stable 2016 and consistent with benign etiology. 3. 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 4. Aortic Atherosclerosis (ICD10-I70.0).   05/17/2020 - 06/26/2020 Radiation Therapy  1. Pelvis:  4500 cGy in 25 fractions 2. Rectal boost:  540 cGy in 3 fractions   05/21/2020 - 06/22/2020 Chemotherapy   Concurrent chemoradiation with infusional 5 FU       08/22/2020 Pathology Results   A. COLON, SIGMOID, RECTUM, ANUS, RESECTION:  - Invasive colorectal adenocarcinoma, 3 cm.  - Carcinoma extends into perirectal connective tissue.  - Nineteen benign lymph nodes (0/19).  - Margins not  involved.    08/31/2020 Cancer Staging   Staging form: Colon and Rectum, AJCC 8th Edition - Pathologic stage from 08/31/2020: Stage IIA (ypT3, pN0, cM0) - Signed by Dellia Beckwith, MD on 01/14/2021 Histopathologic type: Adenocarcinoma, NOS Stage prefix: Post-therapy Response to neoadjuvant therapy: Partial response Total positive nodes: 0 Total nodes examined: 19 Histologic grading system: 4 grade system Histologic grade (G): G2 Residual tumor (R): R0 - None Tumor size (mm): 30 Lymph-vascular invasion (LVI): LVI not present (absent)/not identified Diagnostic confirmation: Positive histology PLUS positive immunophenotyping and/or positive genetic studies Specimen type: Excision Staged by: Managing physician Tumor deposits (TD): Absent Perineural invasion (PNI): Absent Stage used in treatment planning: Yes National guidelines used in treatment planning: Yes Type of national guideline used in treatment planning: NCCN Staging comments: Rec FOLFOX chemotherapy x 4 months   10/09/2020 - 02/14/2021 Chemotherapy   Patient is on Treatment Plan : COLORECTAL FOLFOX q14d x 4 months     Hypokalemia     INTERVAL HISTORY:  Tanya Mcclure is here for routine follow up of rectal adenocarcinoma  and states that she feels well and has no complaint of abdominal pain. She had a colonoscopy done on 10/21/2022 which was normal, she also had a esophagogastroduodenoscopy which reveled some gastritis and a small hiatal hernia. She was placed on Protonix 40 mg twice a day by Dr Charm Barges in the past and I advised her to continue using it. She complained of an episode of a sharp ear pain on her right ear and also saw wax in the ear. She has serous otitis on the left side and otitis externa on the right. I prescribed cortisporin otic 2 drops in the right ear 4 times a day. She had her port flushed today. Her labs are pending today.  She lost her husband in January this year and lost her mother a month after, so is  appropriately grieving. I will see her  back in 4 months with CBC, CMP, CEA, and port flush. She wishes DNR status. She  denies signs of infections such as sore throat, sinus drainage, cough or urinary symptoms. She  denies fever or recurrent chills. She  also deny nausea, vomiting, chest pain dyspnea or cough. Her  appetite is too good and Her  weight has decreased 6 pounds over last 5 months    REVIEW OF SYSTEMS:  Review of Systems  Constitutional:  Negative for appetite change, chills, diaphoresis, fatigue, fever and unexpected weight change.  HENT:  Negative.  Negative for hearing loss, lump/mass, mouth sores, nosebleeds, sore throat, tinnitus, trouble swallowing and voice change.        Right ear pain  Eyes: Negative.  Negative for eye problems and icterus.  Respiratory: Negative.  Negative for chest tightness, cough, hemoptysis, shortness of breath and wheezing.   Cardiovascular: Negative.  Negative for chest pain, leg swelling and palpitations.  Gastrointestinal:  Positive for nausea (intermittent). Negative for abdominal distention, abdominal pain, blood in stool, constipation, diarrhea, rectal pain and vomiting.  Endocrine: Negative.   Genitourinary: Negative.  Negative for bladder  incontinence, difficulty urinating, dyspareunia, dysuria, frequency, hematuria, menstrual problem, nocturia, pelvic pain, vaginal bleeding and vaginal discharge.   Musculoskeletal: Negative.  Negative for arthralgias, back pain, flank pain, gait problem, myalgias, neck pain and neck stiffness.  Skin: Negative.  Negative for itching, rash and wound.  Neurological:  Positive for numbness (neuropathy of the bilateral feet, grade 1). Negative for dizziness, extremity weakness, gait problem, headaches, light-headedness, seizures and speech difficulty.  Hematological: Negative.  Negative for adenopathy. Does not bruise/bleed easily.  Psychiatric/Behavioral: Negative.  Negative for confusion, decreased concentration,  depression, sleep disturbance and suicidal ideas. The patient is not nervous/anxious.      VITALS:  Blood pressure 126/72, pulse 76, temperature 99.1 F (37.3 C), temperature source Oral, resp. rate 18, height 5\' 3"  (1.6 m), weight 186 lb 14.4 oz (84.8 kg), SpO2 98%.  Wt Readings from Last 3 Encounters:  11/26/22 186 lb 14.4 oz (84.8 kg)  06/26/22 192 lb 12.8 oz (87.5 kg)  03/26/22 188 lb 12.8 oz (85.6 kg)    Body mass index is 33.11 kg/m.  Performance status (ECOG): 1 - Symptomatic but completely ambulatory  PHYSICAL EXAM:  Physical Exam Constitutional:      General: She is not in acute distress.    Appearance: Normal appearance. She is normal weight.  HENT:     Head: Normocephalic and atraumatic.     Right Ear: Hearing normal.     Left Ear: Hearing normal.     Ears:     Comments: Serous otitis on the left side and otitis externa on the right with mild erythema and exudate.    Nose: Nose normal.  Eyes:     General: No scleral icterus.    Extraocular Movements: Extraocular movements intact.     Conjunctiva/sclera: Conjunctivae normal.     Pupils: Pupils are equal, round, and reactive to light.  Cardiovascular:     Rate and Rhythm: Normal rate and regular rhythm.     Pulses: Normal pulses.     Heart sounds: Normal heart sounds. No murmur heard.    No friction rub. No gallop.  Pulmonary:     Effort: Pulmonary effort is normal. No respiratory distress.     Breath sounds: Normal breath sounds.  Chest:     Comments: Her breasts were without masses Abdominal:     General: Bowel sounds are normal. There is no distension.     Palpations: Abdomen is soft. There is no hepatomegaly, splenomegaly or mass.     Tenderness: There is no abdominal tenderness.     Comments: She has a colostomy in the left lower quadrant which is functioning normally and the stoma is pink and healthy. Skin tag at about 7 o'clock at the edge of the stoma  Musculoskeletal:        General: Normal range of  motion.     Cervical back: Normal range of motion and neck supple.     Right lower leg: No edema.     Left lower leg: No edema.  Lymphadenopathy:     Cervical: No cervical adenopathy.  Skin:    General: Skin is warm and dry.  Neurological:     General: No focal deficit present.     Mental Status: She is alert and oriented to person, place, and time. Mental status is at baseline.  Psychiatric:        Mood and Affect: Mood normal.        Behavior: Behavior normal.  Thought Content: Thought content normal.        Judgment: Judgment normal.     LABS:      Latest Ref Rng & Units 11/26/2022   11:06 AM 06/26/2022    9:55 AM 03/26/2022    8:43 AM  CBC  WBC 4.0 - 10.5 K/uL 5.0  4.8  4.3   Hemoglobin 12.0 - 15.0 g/dL 16.1  09.6  04.5   Hematocrit 36.0 - 46.0 % 41.7  41.6  40.2   Platelets 150 - 400 K/uL 221  244  212       Latest Ref Rng & Units 11/26/2022   11:06 AM 06/26/2022    9:55 AM 03/26/2022    8:43 AM  CMP  Glucose 70 - 99 mg/dL 409  811  914   BUN 8 - 23 mg/dL 17  12  13    Creatinine 0.44 - 1.00 mg/dL 7.82  9.56  2.13   Sodium 135 - 145 mmol/L 135  138  135   Potassium 3.5 - 5.1 mmol/L 3.8  3.8  3.8   Chloride 98 - 111 mmol/L 99  102  100   CO2 22 - 32 mmol/L 26  26  28    Calcium 8.9 - 10.3 mg/dL 9.1  9.1  9.0   Total Protein 6.5 - 8.1 g/dL 7.8  8.0  7.7   Total Bilirubin 0.3 - 1.2 mg/dL 0.5  0.5  0.6   Alkaline Phos 38 - 126 U/L 45  47  51   AST 15 - 41 U/L 21  27  18    ALT 0 - 44 U/L 19  24  17       Lab Results  Component Value Date   CEA1 1.8 11/26/2022   /  CEA  Date Value Ref Range Status  11/26/2022 1.8 0.0 - 4.7 ng/mL Final    Comment:    (NOTE)                             Nonsmokers          <3.9                             Smokers             <5.6 [employer] Electrochemiluminescence Immunoassay (ECLIA) Values obtained with different assay methods or kits cannot be used interchangeably.  Results cannot be interpreted as absolute  evidence of the presence or absence of malignant disease. Performed At: [facility] [address] [place], [place] [id number] Jolene Schimke MD GE:[id number]      STUDIES:    EXAM: 10/21/2022 COLONOSCOPY via OSTOMY IMPRESSION Normal colonoscopy  EXAM: 10/21/2022 ESOPHAGOGASTRODUODENOSCOPY WITH BIOPSY IMPRESSION Gastritis with oozing of blood Small hiatal hernia   EXAM:07/22/2020 MRI PELVIS WITHOUT CONTRAST  FINDINGS: TUMOR LOCATION   Tumor distance from Anal Verge/Skin Surface:  4.0 cm   Tumor distance to Internal Anal Sphincter: 0 cm   TUMOR DESCRIPTION   Circumferential Extent: Tumor is likely circumferential, although more bulky residual tumor extends posteriorly from 2:30 through 9 o'clock (series 3/image 23).   Tumor Length: 5.8 cm   T - CATEGORY   Extension through Muscularis Propria: Yes>71mm=T3d, with gross perirectal extension extending from 3:30 to 6 o'clock measuring approximately 16-17 mm (series 3/images 22-23).   Shortest Distance of any tumor/node from Mesorectal Fascia: 0 mm, given left pelvic floor involvement (  described below)   Extramural Vascular Invasion/Tumor Thrombus: No   Invasion of Anterior Peritoneal Reflection: No   Involvement of Adjacent Organs or Pelvic Sidewall: Yes=T4, tumor abuts and likely involves the left levator ani muscle (series 3/image 23)   Levator Ani Involvement: Yes   N - CATEGORY   Mesorectal Lymph Nodes >=57mm: 1-3=N1   Extra-mesorectal Lymphadenopathy: No   Other:  None.   IMPRESSION: 5.8 cm circumferential distal rectal tumor abutting the internal anal sphincter. Bulky residual tumor posteriorly with gross perirectal extension along the left posterior aspect, as described above, abutting/involving the left levator ani muscle.   Rectal adenocarcinoma T stage: T4   Rectal adenocarcinoma N stage:  N1   Distance from tumor to the internal anal sphincter is 0 cm.  HISTORY:    Allergies:  Allergies  Allergen Reactions   Penicillins Rash    Current Medications: Current Outpatient Medications  Medication Sig Dispense Refill   Accu-Chek Softclix Lancets lancets USE TO CHECK BLOOD GLUCOSE TWICE DAILY.     albuterol (VENTOLIN HFA) 108 (90 Base) MCG/ACT inhaler Inhale 1-2 puffs into the lungs every 6 (six) hours as needed.     atorvastatin (LIPITOR) 40 MG tablet Take 40 mg by mouth daily.     Blood Glucose Monitoring Suppl (ACCU-CHEK GUIDE ME) w/Device KIT See admin instructions.     Blood Glucose Monitoring Suppl (GLUCOCOM BLOOD GLUCOSE MONITOR) DEVI 1 each by Misc.(Non-Drug; Combo Route) route daily.     budesonide-formoterol (SYMBICORT) 160-4.5 MCG/ACT inhaler Inhale into the lungs.     celecoxib (CELEBREX) 100 MG capsule Take 100 mg by mouth 2 (two) times daily.     furosemide (LASIX) 20 MG tablet Take 1 tablet by mouth as needed. As needed for swelling     gabapentin (NEURONTIN) 400 MG capsule Take 400 mg by mouth 3 (three) times daily.     glucose blood (ACCU-CHEK AVIVA PLUS) test strip Check blood glucose twice daily and as needed. Dx E 11.65     ibuprofen (ADVIL) 800 MG tablet Take by mouth.     Lancets Misc. (ACCU-CHEK FASTCLIX LANCET) KIT Check blood glucose twice daily. Dx E 11.65     levocetirizine (XYZAL) 5 MG tablet Take 5 mg by mouth daily. (Patient not taking: Reported on 06/26/2022)     levothyroxine (SYNTHROID) 125 MCG tablet Take 125 mcg by mouth daily.     lisinopril (ZESTRIL) 5 MG tablet Take 5 mg by mouth daily.     metFORMIN (GLUCOPHAGE-XR) 500 MG 24 hr tablet Take 2 tablets by mouth daily with breakfast.     montelukast (SINGULAIR) 10 MG tablet Take 1 tablet by mouth daily.     neomycin-polymyxin-hydrocortisone (CORTISPORIN) OTIC solution Place 3 drops into the right ear 4 (four) times daily. 10 mL 0   ondansetron (ZOFRAN) 4 MG tablet Take 1 tablet (4 mg total) by mouth every 4 (four) hours as needed for nausea. 90 tablet 0   OZEMPIC, 0.25  OR 0.5 MG/DOSE, 2 MG/3ML SOPN Inject into the skin.     pantoprazole (PROTONIX) 40 MG tablet Take 1 tablet (40 mg total) by mouth 2 (two) times daily. 180 tablet 3   prochlorperazine (COMPAZINE) 10 MG tablet Take 10 mg by mouth every 6 (six) hours as needed.     Current Facility-Administered Medications  Medication Dose Route Frequency Provider Last Rate Last Admin   sodium chloride flush (NS) 0.9 % injection 10 mL  10 mL Intracatheter PRN Pascal Lux, NP   10  mL at 11/26/22 1125   triamcinolone acetonide (KENALOG) 10 MG/ML injection 10 mg  10 mg Other Once Asencion Islam, DPM       triamcinolone acetonide (KENALOG-40) injection 20 mg  20 mg Other Once Asencion Islam, DPM           I,Oluwatobi Asade,acting as a scribe for Dellia Beckwith, MD.,have documented all relevant documentation on the behalf of Dellia Beckwith, MD,as directed by  Dellia Beckwith, MD while in the presence of Dellia Beckwith, MD.   I have reviewed this report as typed by the medical scribe, and it is complete and accurate.

## 2022-11-26 ENCOUNTER — Encounter: Payer: Self-pay | Admitting: Oncology

## 2022-11-26 ENCOUNTER — Other Ambulatory Visit: Payer: Self-pay | Admitting: Oncology

## 2022-11-26 ENCOUNTER — Inpatient Hospital Stay: Payer: Medicaid Other | Admitting: Oncology

## 2022-11-26 ENCOUNTER — Inpatient Hospital Stay: Payer: Medicaid Other | Attending: Oncology

## 2022-11-26 VITALS — BP 126/72 | HR 76 | Temp 99.1°F | Resp 18 | Ht 63.0 in | Wt 186.9 lb

## 2022-11-26 DIAGNOSIS — C20 Malignant neoplasm of rectum: Secondary | ICD-10-CM

## 2022-11-26 DIAGNOSIS — H6692 Otitis media, unspecified, left ear: Secondary | ICD-10-CM | POA: Insufficient documentation

## 2022-11-26 DIAGNOSIS — Z66 Do not resuscitate: Secondary | ICD-10-CM | POA: Insufficient documentation

## 2022-11-26 DIAGNOSIS — H6091 Unspecified otitis externa, right ear: Secondary | ICD-10-CM | POA: Diagnosis not present

## 2022-11-26 DIAGNOSIS — K2970 Gastritis, unspecified, without bleeding: Secondary | ICD-10-CM | POA: Insufficient documentation

## 2022-11-26 DIAGNOSIS — Z85048 Personal history of other malignant neoplasm of rectum, rectosigmoid junction, and anus: Secondary | ICD-10-CM | POA: Diagnosis not present

## 2022-11-26 DIAGNOSIS — G629 Polyneuropathy, unspecified: Secondary | ICD-10-CM | POA: Insufficient documentation

## 2022-11-26 DIAGNOSIS — E876 Hypokalemia: Secondary | ICD-10-CM | POA: Diagnosis not present

## 2022-11-26 DIAGNOSIS — K449 Diaphragmatic hernia without obstruction or gangrene: Secondary | ICD-10-CM | POA: Insufficient documentation

## 2022-11-26 LAB — CBC WITH DIFFERENTIAL (CANCER CENTER ONLY)
Abs Immature Granulocytes: 0.06 10*3/uL (ref 0.00–0.07)
Basophils Absolute: 0 10*3/uL (ref 0.0–0.1)
Basophils Relative: 1 %
Eosinophils Absolute: 0.1 10*3/uL (ref 0.0–0.5)
Eosinophils Relative: 2 %
HCT: 41.7 % (ref 36.0–46.0)
Hemoglobin: 13.8 g/dL (ref 12.0–15.0)
Immature Granulocytes: 1 %
Lymphocytes Relative: 19 %
Lymphs Abs: 0.9 10*3/uL (ref 0.7–4.0)
MCH: 29.2 pg (ref 26.0–34.0)
MCHC: 33.1 g/dL (ref 30.0–36.0)
MCV: 88.3 fL (ref 80.0–100.0)
Monocytes Absolute: 0.4 10*3/uL (ref 0.1–1.0)
Monocytes Relative: 8 %
Neutro Abs: 3.5 10*3/uL (ref 1.7–7.7)
Neutrophils Relative %: 69 %
Platelet Count: 221 10*3/uL (ref 150–400)
RBC: 4.72 MIL/uL (ref 3.87–5.11)
RDW: 13.2 % (ref 11.5–15.5)
WBC Count: 5 10*3/uL (ref 4.0–10.5)
nRBC: 0 % (ref 0.0–0.2)

## 2022-11-26 LAB — CMP (CANCER CENTER ONLY)
ALT: 19 U/L (ref 0–44)
AST: 21 U/L (ref 15–41)
Albumin: 4.1 g/dL (ref 3.5–5.0)
Alkaline Phosphatase: 45 U/L (ref 38–126)
Anion gap: 10 (ref 5–15)
BUN: 17 mg/dL (ref 8–23)
CO2: 26 mmol/L (ref 22–32)
Calcium: 9.1 mg/dL (ref 8.9–10.3)
Chloride: 99 mmol/L (ref 98–111)
Creatinine: 0.81 mg/dL (ref 0.44–1.00)
GFR, Estimated: >60 mL/min (ref >60)
Glucose, Bld: 170 mg/dL — ABNORMAL HIGH (ref 70–99)
Potassium: 3.8 mmol/L (ref 3.5–5.1)
Sodium: 135 mmol/L (ref 135–145)
Total Bilirubin: 0.5 mg/dL (ref 0.3–1.2)
Total Protein: 7.8 g/dL (ref 6.5–8.1)

## 2022-11-26 MED ORDER — HEPARIN SOD (PORK) LOCK FLUSH 100 UNIT/ML IV SOLN
500.0000 [IU] | Freq: Once | INTRAVENOUS | Status: AC | PRN
  Administered 2022-11-26: 500 [IU]

## 2022-11-26 MED ORDER — NEOMYCIN-POLYMYXIN-HC 3.5-10000-1 OT SOLN: 3.0000 [drp] | Freq: Four times a day (QID) | OTIC | 0 refills | Status: DC

## 2022-11-26 MED ORDER — SODIUM CHLORIDE 0.9% FLUSH
10.0000 mL | INTRAVENOUS | Status: DC | PRN
  Administered 2022-11-26: 10 mL

## 2022-11-27 LAB — CEA: CEA: 1.8 ng/mL (ref 0.0–4.7)

## 2022-11-28 ENCOUNTER — Telehealth: Payer: Self-pay

## 2022-11-28 NOTE — Telephone Encounter (Signed)
Patient has been advised

## 2022-11-28 NOTE — Telephone Encounter (Signed)
-----   Message from Dellia Beckwith sent at 11/27/2022  1:42 PM EDT ----- Regarding: call Tell her labs all look good

## 2022-12-01 ENCOUNTER — Other Ambulatory Visit (HOSPITAL_COMMUNITY): Payer: Self-pay

## 2022-12-10 ENCOUNTER — Encounter: Payer: Self-pay | Admitting: Hematology and Oncology

## 2022-12-24 ENCOUNTER — Encounter: Payer: Self-pay | Admitting: Specialist

## 2023-01-08 ENCOUNTER — Other Ambulatory Visit: Payer: Self-pay | Admitting: Oncology

## 2023-03-27 ENCOUNTER — Encounter: Payer: Self-pay | Admitting: Hematology and Oncology

## 2023-03-27 ENCOUNTER — Inpatient Hospital Stay: Payer: Medicaid Other | Attending: Hematology and Oncology

## 2023-03-27 ENCOUNTER — Telehealth: Payer: Self-pay | Admitting: Hematology and Oncology

## 2023-03-27 ENCOUNTER — Ambulatory Visit: Payer: Medicaid Other | Admitting: Oncology

## 2023-03-27 ENCOUNTER — Inpatient Hospital Stay (HOSPITAL_BASED_OUTPATIENT_CLINIC_OR_DEPARTMENT_OTHER): Payer: Medicaid Other | Admitting: Hematology and Oncology

## 2023-03-27 VITALS — BP 124/74 | HR 73 | Temp 98.7°F | Resp 18 | Ht 63.0 in | Wt 184.7 lb

## 2023-03-27 DIAGNOSIS — Z85048 Personal history of other malignant neoplasm of rectum, rectosigmoid junction, and anus: Secondary | ICD-10-CM | POA: Diagnosis present

## 2023-03-27 DIAGNOSIS — Z9049 Acquired absence of other specified parts of digestive tract: Secondary | ICD-10-CM | POA: Diagnosis not present

## 2023-03-27 DIAGNOSIS — E876 Hypokalemia: Secondary | ICD-10-CM

## 2023-03-27 DIAGNOSIS — Z9221 Personal history of antineoplastic chemotherapy: Secondary | ICD-10-CM | POA: Insufficient documentation

## 2023-03-27 DIAGNOSIS — C20 Malignant neoplasm of rectum: Secondary | ICD-10-CM | POA: Diagnosis not present

## 2023-03-27 LAB — CBC WITH DIFFERENTIAL (CANCER CENTER ONLY)
Abs Immature Granulocytes: 0.05 10*3/uL (ref 0.00–0.07)
Basophils Absolute: 0.1 10*3/uL (ref 0.0–0.1)
Basophils Relative: 1 %
Eosinophils Absolute: 0.1 10*3/uL (ref 0.0–0.5)
Eosinophils Relative: 3 %
HCT: 38.9 % (ref 36.0–46.0)
Hemoglobin: 13.8 g/dL (ref 12.0–15.0)
Immature Granulocytes: 1 %
Lymphocytes Relative: 19 %
Lymphs Abs: 1 10*3/uL (ref 0.7–4.0)
MCH: 30.1 pg (ref 26.0–34.0)
MCHC: 35.5 g/dL (ref 30.0–36.0)
MCV: 84.7 fL (ref 80.0–100.0)
Monocytes Absolute: 0.5 10*3/uL (ref 0.1–1.0)
Monocytes Relative: 10 %
Neutro Abs: 3.5 10*3/uL (ref 1.7–7.7)
Neutrophils Relative %: 66 %
Platelet Count: 241 10*3/uL (ref 150–400)
RBC: 4.59 MIL/uL (ref 3.87–5.11)
RDW: 12.6 % (ref 11.5–15.5)
WBC Count: 5.3 10*3/uL (ref 4.0–10.5)
nRBC: 0 % (ref 0.0–0.2)
nRBC: 0 /100{WBCs}

## 2023-03-27 LAB — CEA (ACCESS): CEA (CHCC): 1.7 ng/mL (ref 0.00–5.00)

## 2023-03-27 LAB — CMP (CANCER CENTER ONLY)
ALT: 12 U/L (ref 0–44)
AST: 21 U/L (ref 15–41)
Albumin: 4.4 g/dL (ref 3.5–5.0)
Alkaline Phosphatase: 60 U/L (ref 38–126)
Anion gap: 12 (ref 5–15)
BUN: 17 mg/dL (ref 8–23)
CO2: 26 mmol/L (ref 22–32)
Calcium: 9.5 mg/dL (ref 8.9–10.3)
Chloride: 99 mmol/L (ref 98–111)
Creatinine: 0.84 mg/dL (ref 0.44–1.00)
GFR, Estimated: >60 mL/min (ref >60)
Glucose, Bld: 195 mg/dL — ABNORMAL HIGH (ref 70–99)
Potassium: 3.8 mmol/L (ref 3.5–5.1)
Sodium: 137 mmol/L (ref 135–145)
Total Bilirubin: 0.5 mg/dL (ref <1.2)
Total Protein: 7.7 g/dL (ref 6.5–8.1)

## 2023-03-27 MED ORDER — HEPARIN SOD (PORK) LOCK FLUSH 100 UNIT/ML IV SOLN
500.0000 [IU] | Freq: Once | INTRAVENOUS | Status: AC | PRN
  Administered 2023-03-27: 500 [IU]

## 2023-03-27 MED ORDER — SODIUM CHLORIDE 0.9% FLUSH
10.0000 mL | INTRAVENOUS | Status: DC | PRN
Start: 2023-03-27 — End: 2023-03-27

## 2023-03-27 NOTE — Assessment & Plan Note (Signed)
Clinical stage IIIB ([id number]) rectal cancer, diagnosed in December 2021. CT revealed several small perirectal and presacral lymph nodes, which were highly concerning for metastatic involvement. She received neoadjuvant chemotherapy followed by abdominoperitoneal resection in April 2022 and lymph nodes were negative. She completed 8 cycles of adjuvant FOLFOX chemotherapy in early October 2022. She underwent EGD and colonoscopy in June with Dr. Jennye Boroughs.  Colonoscopy was normal, follow-up in 3 years was recommended.  She found to have gastritis with some oozing of blood and a small hiatal hernia.  She continues pantoprazole. She remains without evidence of recurrence. We will plan to see her back in 4 months with a CBC, comprehensive metabolic panel and CEA.

## 2023-03-27 NOTE — Progress Notes (Signed)
[facility]  [address] [place],  [place]  [address] [phone]  Clinic Day:  03/27/2023  Referring physician: Francee Piccolo I, NP  ASSESSMENT & PLAN:   Assessment & Plan: Rectal cancer Tanya Mcclure) Clinical stage IIIB ([id number]) rectal cancer, diagnosed in December 2021. CT revealed several small perirectal and presacral lymph nodes, which were highly concerning for metastatic involvement. She received neoadjuvant chemotherapy followed by abdominoperitoneal resection in April 2022 and lymph nodes were negative. She completed 8 cycles of adjuvant FOLFOX chemotherapy in early October 2022. She underwent EGD and colonoscopy in June with Dr. Jennye Mcclure.  Colonoscopy was normal, follow-up in 3 years was recommended.  She found to have gastritis with some oozing of blood and a small hiatal hernia.  She continues pantoprazole. She remains without evidence of recurrence. We will plan to see her back in 4 months with a CBC, comprehensive metabolic panel and CEA.    The patient understands the plans discussed today and is in agreement with them.  She knows to contact our office if she develops concerns prior to her next appointment.   [facility]ure provided 20 minutes of face-to-face time during this encounter and > 50% was spent counseling as documented under my assessment and plan.    Tanya Perl, PA-C  [facility] A DE[facility] OF [facility] [address] [place] [address] Dept: [phone] Dept Fax: [phone]   Orders Placed This Encounter  Procedures   CEA (Access)      CHIEF COMPLAINT:  CC: Clinical stage IIIB rectal cancer  Current Treatment: Observation  HISTORY OF PRESENT ILLNESS:   Oncology History  Rectal cancer (HCC)  04/20/2020 Procedure   COLONOSCOPY: Rectal mass beginning at about 2 cm from the anal verge and was noted to obstruct 50% of the circumference of the rectum.  Benign polyps were also  present and removed.   04/20/2020 Pathology Results   Rectum, biopsy:  -  Invasive adenocarcinoma    04/22/2020 Cancer Staging   Staging form: Colon and Rectum, AJCC 8th Edition - Clinical stage from 04/22/2020: Stage IIIB (cT3, cN1b, cM0) - Signed by Tanya Beckwith, MD on 06/04/2020   04/23/2020 Initial Diagnosis   Rectal cancer (HCC)   04/24/2020 Imaging   CT ABDOMEN AND PELVIS WITH CONTRAST: 1. Known rectal lesion not well demonstrated by CT. There are several small perirectal and presacral lymph nodes measuring up to 8 mm short axis. These are highly concerning for metastatic involvement. 2. Right middle lobe pulmonary nodules, stable 2016 and consistent with benign etiology. 3. 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 4. Aortic Atherosclerosis (ICD10-I70.0).   05/17/2020 - 06/26/2020 Radiation Therapy   1. Pelvis:  4500 cGy in 25 fractions 2. Rectal boost:  540 cGy in 3 fractions   05/21/2020 - 06/22/2020 Chemotherapy   Concurrent chemoradiation with infusional 5 FU       08/22/2020 Pathology Results   A. COLON, SIGMOID, RECTUM, ANUS, RESECTION:  - Invasive colorectal adenocarcinoma, 3 cm.  - Carcinoma extends into perirectal connective tissue.  - Nineteen benign lymph nodes (0/19).  - Margins not involved.    08/31/2020 Cancer Staging   Staging form: Colon and Rectum, AJCC 8th Edition - Pathologic stage from 08/31/2020: Stage IIA (ypT3, pN0, cM0) - Signed by Tanya Beckwith, MD on 01/14/2021 Histopathologic type: Adenocarcinoma, NOS Stage prefix: Post-therapy Response to neoadjuvant therapy: Partial response Total positive nodes: 0 Total  nodes examined: 19 Histologic grading system: 4 grade system Histologic grade (G): G2 Residual tumor (R): R0 - None Tumor size (mm): 30 Lymph-vascular invasion (LVI): LVI not present (absent)/not identified Diagnostic confirmation: Positive histology PLUS positive immunophenotyping and/or positive  genetic studies Specimen type: Excision Staged by: Managing physician Tumor deposits (TD): Absent Perineural invasion (PNI): Absent Stage used in treatment planning: Yes National guidelines used in treatment planning: Yes Type of national guideline used in treatment planning: NCCN Staging comments: Rec FOLFOX chemotherapy x 4 months   10/09/2020 - 02/14/2021 Chemotherapy   Patient is on Treatment Plan : COLORECTAL FOLFOX q14d x 4 months     Hypokalemia      INTERVAL HISTORY:  Tanya Mcclure is here today for repeat clinical assessment.  She states she has been doing well, but has fatigue.  She otherwise denies complaints.  She denies fevers or chills. She denies pain. Her appetite is good. Her weight has decreased 2 pounds over last 4 months .  She continues to care for her 3 grandchildren.  REVIEW OF SYSTEMS:  Review of Systems  Constitutional:  Negative for appetite change, chills, fatigue, fever and unexpected weight change.  HENT:   Negative for lump/mass, mouth sores, sore throat and trouble swallowing.   Respiratory:  Negative for cough and shortness of breath.   Cardiovascular:  Negative for chest pain and leg swelling.  Gastrointestinal:  Negative for abdominal distention, abdominal pain, blood in stool, constipation, diarrhea, nausea, rectal pain and vomiting.  Genitourinary:  Negative for difficulty urinating, dysuria, frequency and hematuria.   Musculoskeletal:  Negative for arthralgias, back pain and myalgias.  Skin:  Negative for rash.  Neurological:  Negative for dizziness, headaches and light-headedness.  Hematological:  Negative for adenopathy. Does not bruise/bleed easily.  Psychiatric/Behavioral:  Negative for depression and sleep disturbance. The patient is not nervous/anxious.      VITALS:  Blood pressure 124/74, pulse 73, temperature 98.7 F (37.1 C), temperature source Oral, resp. rate 18, height 5\' 3"  (1.6 m), weight 184 lb 11.2 oz (83.8 kg), SpO2 100%.  Wt Readings  from Last 3 Encounters:  03/27/23 184 lb 11.2 oz (83.8 kg)  11/26/22 186 lb 14.4 oz (84.8 kg)  06/26/22 192 lb 12.8 oz (87.5 kg)    Body mass index is 32.72 kg/m.  Performance status (ECOG): 1 - Symptomatic but completely ambulatory  PHYSICAL EXAM:  Physical Exam Vitals and nursing note reviewed.  Constitutional:      General: She is not in acute distress.    Appearance: Normal appearance.  HENT:     Head: Normocephalic and atraumatic.     Mouth/Throat:     Mouth: Mucous membranes are moist.     Pharynx: Oropharynx is clear. No oropharyngeal exudate or posterior oropharyngeal erythema.  Eyes:     General: No scleral icterus.    Extraocular Movements: Extraocular movements intact.     Conjunctiva/sclera: Conjunctivae normal.     Pupils: Pupils are equal, round, and reactive to light.  Cardiovascular:     Rate and Rhythm: Normal rate and regular rhythm.     Heart sounds: Normal heart sounds. No murmur heard.    No friction rub. No gallop.  Pulmonary:     Effort: Pulmonary effort is normal.     Breath sounds: Normal breath sounds. No wheezing, rhonchi or rales.  Abdominal:     General: There is no distension.     Palpations: Abdomen is soft. There is no hepatomegaly, splenomegaly or mass.  Tenderness: There is no abdominal tenderness.     Comments: Stoma looks good  Musculoskeletal:        General: Normal range of motion.     Cervical back: Normal range of motion and neck supple. No tenderness.     Right lower leg: No edema.     Left lower leg: No edema.  Lymphadenopathy:     Cervical: No cervical adenopathy.     Upper Body:     Right upper body: No supraclavicular or axillary adenopathy.     Left upper body: No supraclavicular or axillary adenopathy.     Lower Body: No right inguinal adenopathy. No left inguinal adenopathy.  Skin:    General: Skin is warm and dry.     Coloration: Skin is not jaundiced.     Findings: No rash.  Neurological:     Mental Status: She  is alert and oriented to person, place, and time.     Cranial Nerves: No cranial nerve deficit.  Psychiatric:        Mood and Affect: Mood normal.        Behavior: Behavior normal.        Thought Content: Thought content normal.     LABS:      Latest Ref Rng & Units 03/27/2023   10:55 AM 11/26/2022   11:06 AM 06/26/2022    9:55 AM  CBC  WBC 4.0 - 10.5 K/uL 5.3  5.0  4.8   Hemoglobin 12.0 - 15.0 g/dL 16.1  09.6  04.5   Hematocrit 36.0 - 46.0 % 38.9  41.7  41.6   Platelets 150 - 400 K/uL 241  221  244       Latest Ref Rng & Units 03/27/2023   10:55 AM 11/26/2022   11:06 AM 06/26/2022    9:55 AM  CMP  Glucose 70 - 99 mg/dL 409  811  914   BUN 8 - 23 mg/dL 17  17  12    Creatinine 0.44 - 1.00 mg/dL 7.82  9.56  2.13   Sodium 135 - 145 mmol/L 137  135  138   Potassium 3.5 - 5.1 mmol/L 3.8  3.8  3.8   Chloride 98 - 111 mmol/L 99  99  102   CO2 22 - 32 mmol/L 26  26  26    Calcium 8.9 - 10.3 mg/dL 9.5  9.1  9.1   Total Protein 6.5 - 8.1 g/dL 7.7  7.8  8.0   Total Bilirubin <1.2 mg/dL 0.5  0.5  0.5   Alkaline Phos 38 - 126 U/L 60  45  47   AST 15 - 41 U/L 21  21  27    ALT 0 - 44 U/L 12  19  24       Lab Results  Component Value Date   CEA1 1.8 11/26/2022   /  CEA  Date Value Ref Range Status  11/26/2022 1.8 0.0 - 4.7 ng/mL Final    Comment:    (NOTE)                             Nonsmokers          <3.9                             Smokers             <5.6 Roche Engineer, petroleum Immunoassay (  ECLIA) Values obtained with different assay methods or kits cannot be used interchangeably.  Results cannot be interpreted as absolute evidence of the presence or absence of malignant disease. Performed At: [facility] [address] [place], [place] [id number] Jolene Schimke MD NF:[id number]      STUDIES:  No results found.    HISTORY:   Past Medical History:  Diagnosis Date   Arthritis    Asthma    Cancer (HCC) 04/2020   colon   Diabetes  mellitus without complication (HCC)    Dyspnea    walking,   Family history of adverse reaction to anesthesia    daughter PONV   GERD (gastroesophageal reflux disease)    Hypertension    Hypothyroidism    Thyroid disease     Past Surgical History:  Procedure Laterality Date   ABDOMINAL HYSTERECTOMY     due to fibroids   CHOLECYSTECTOMY     HYSTERECTOMY ABDOMINAL WITH SALPINGO-OOPHORECTOMY     POLYPECTOMY     PORTA CATH INSERTION     TUBAL LIGATION     XI ROBOTIC ASSISTED LOWER ANTERIOR RESECTION N/A 08/22/2020   Procedure: XI ROBOTIC ABDOMINAL PERIITONEAL RESECTION;  Surgeon: Romie Levee, MD;  Location: [facility];  Service: General;  Laterality: N/A;  4 HOURS    Family History  Problem Relation Age of Onset   Diabetes Mother    Hypertension Mother    Heart disease Mother    Heart attack Mother    Diabetes Father    Hypertension Father    Diabetes Sister    COPD Sister     Social History:  reports that she has never smoked. She has never used smokeless tobacco. She reports that she does not currently use alcohol. She reports that she does not use drugs.The patient is alone today.  Allergies:  Allergies  Allergen Reactions   Penicillins Rash    Current Medications: Current Outpatient Medications  Medication Sig Dispense Refill   celecoxib (CELEBREX) 200 MG capsule Take 200 mg by mouth 2 (two) times daily as needed.     furosemide (LASIX) 20 MG tablet Take 1 tablet by mouth daily.     hydrochlorothiazide (MICROZIDE) 12.5 MG capsule Take 12.5 mg by mouth daily.     OZEMPIC, 1 MG/DOSE, 4 MG/3ML SOPN Inject 1 mg into the skin once a week.     pioglitazone (ACTOS) 30 MG tablet Take 30 mg by mouth daily.     Accu-Chek Softclix Lancets lancets USE TO CHECK BLOOD GLUCOSE TWICE DAILY.     albuterol (VENTOLIN HFA) 108 (90 Base) MCG/ACT inhaler Inhale 1-2 puffs into the lungs every 6 (six) hours as needed.     atorvastatin (LIPITOR) 40 MG tablet Take 40 mg by mouth daily.      Blood Glucose Monitoring Suppl (ACCU-CHEK GUIDE ME) w/Device KIT See admin instructions.     Blood Glucose Monitoring Suppl (GLUCOCOM BLOOD GLUCOSE MONITOR) DEVI 1 each by Misc.(Non-Drug; Combo Route) route daily.     budesonide-formoterol (SYMBICORT) 160-4.5 MCG/ACT inhaler Inhale into the lungs. (Patient not taking: Reported on 03/27/2023)     gabapentin (NEURONTIN) 400 MG capsule Take 400 mg by mouth 3 (three) times daily.     glucose blood (ACCU-CHEK AVIVA PLUS) test strip Check blood glucose twice daily and as needed. Dx E 11.65     Lancets Misc. (ACCU-CHEK FASTCLIX LANCET) KIT Check blood glucose twice daily. Dx E 11.65     levocetirizine (XYZAL) 5 MG tablet Take 5 mg by  mouth daily. (Patient not taking: Reported on 06/26/2022)     levothyroxine (SYNTHROID) 125 MCG tablet Take 125 mcg by mouth daily.     lisinopril (ZESTRIL) 5 MG tablet Take 5 mg by mouth daily.     metFORMIN (GLUCOPHAGE-XR) 500 MG 24 hr tablet Take 2 tablets by mouth daily with breakfast.     montelukast (SINGULAIR) 10 MG tablet Take 1 tablet by mouth daily.     NEOMYCIN-POLYMYXIN-HYDROCORTISONE (CORTISPORIN) 1 % SOLN OTIC solution INSTILL 3 DROPS TO RIGHT EAR FOUR TIMES DAILY 10 mL 0   ondansetron (ZOFRAN) 4 MG tablet Take 1 tablet (4 mg total) by mouth every 4 (four) hours as needed for nausea. 90 tablet 0   pantoprazole (PROTONIX) 40 MG tablet Take 1 tablet (40 mg total) by mouth 2 (two) times daily. 180 tablet 3   prochlorperazine (COMPAZINE) 10 MG tablet Take 10 mg by mouth every 6 (six) hours as needed.     Current Facility-Administered Medications  Medication Dose Route Frequency Provider Last Rate Last Admin   triamcinolone acetonide (KENALOG) 10 MG/ML injection 10 mg  10 mg Other Once Asencion Islam, DPM       triamcinolone acetonide (KENALOG-40) injection 20 mg  20 mg Other Once Asencion Islam, DPM

## 2023-03-27 NOTE — Telephone Encounter (Signed)
03/27/23 Spoke with patient and confirmed next appt.

## 2023-03-28 ENCOUNTER — Encounter: Payer: Self-pay | Admitting: Hematology and Oncology

## 2023-03-30 ENCOUNTER — Telehealth: Payer: Self-pay

## 2023-03-30 NOTE — Telephone Encounter (Signed)
Attempted to contact patient. No answer. 

## 2023-03-30 NOTE — Telephone Encounter (Signed)
-----   Message from Adah Perl sent at 03/28/2023 10:13 AM EST ----- Please let her know her cancer test was normal. Thanks

## 2023-04-02 ENCOUNTER — Telehealth: Payer: Self-pay

## 2023-04-02 NOTE — Telephone Encounter (Signed)
Attempted to contact patient. No answer. 

## 2023-04-02 NOTE — Telephone Encounter (Signed)
-----   Message from Adah Perl sent at 03/28/2023 10:13 AM EST ----- Please let her know her cancer test was normal. Thanks

## 2023-07-23 NOTE — Progress Notes (Signed)
 [facility]  [address] [place],  [place]  [address] [phone]  Clinic Day:  07/24/23  Referring physician: Francee Piccolo I, NP  ASSESSMENT & PLAN:  Assessment: Rectal cancer [facility]) Clinical stage IIIB ([id number]) rectal cancer, diagnosed in December 2021. CT revealed several small perirectal and presacral lymph nodes, which were highly concerning for metastatic involvement. She received neoadjuvant chemotherapy followed by abdominoperitoneal resection in April 2022 and lymph nodes were negative. She completed 8 cycles of adjuvant FOLFOX chemotherapy in early October 2022. She underwent EGD and colonoscopy in June of 2024 with Dr. Jennye Boroughs.  Colonoscopy was normal, follow-up in 3 years was recommended.  She found to have gastritis with some oozing of blood and a small hiatal hernia.  She continues pantoprazole. She remains without evidence of recurrence.   Grade 1 neuropathy She continues gabapentin 300 mg BID.   Diabetes, poorly controlled She is not following a diet and doesn't like vegetables and her glucose is out of control. We will have the dietician contact her but it will be a challenge to change her eating habits. I instructed her to follow-up with Dr. Nedra Hai regarding her medications.   Plan: The patient informed me the that the supply company for her colostomy bags needs updated information and I will complete that for her. Her last colonoscopy was done on 10/21/2022 which was normal. She was recommended a 3 year follow-up. She informed me that when taking her Ozempic it caused nausea and vomiting, she does note not eating a healthy diet as vegetables are not her favorite and that she may have to reduce her dosage. I advised she contact and discuss this with her PCP and will refer her to a dietician. She has a WBC of 4.6, hemoglobin of 13.6, and platelet count of 239,000. Her CMP is normal other than a elevated glucose of 397 and sodium of 134. Her CEA today is pending.  She had her port flushed today. She is past due for her annual mammogram and I will schedule that for her today. I will see her back in 6 months with CBC, CMP, CEA, and port flush. I informed her that she can have her port removed when she is ready at any time. The patient understands the plans discussed today and is in agreement with them.  She knows to contact our office if she develops concerns prior to her next appointment.  I provided 17 minutes of face-to-face time during this encounter and > 50% was spent counseling as documented under my assessment and plan.   Dellia Beckwith, MD [facility] - [facility] DEPT OF [facility] [address] [place] [address] Dept: [phone] Dept Fax: [phone]   Orders Placed This Encounter  Procedures   MM Digital Screening    Standing Status:   Future    Expiration Date:   07/23/2024    Reason for Exam (SYMPTOM  OR DIAGNOSIS REQUIRED):   screen    Preferred imaging location?:   [facility]   Ambulatory Referral to [facility] Nutrition    Referral Priority:   Routine    Referral Type:   Consultation    Referral Reason:   Specialty Services Required    Number of Visits Requested:   1    CHIEF COMPLAINT:  CC: Clinical stage IIIB rectal cancer  Current Treatment: Observation  HISTORY OF PRESENT ILLNESS:   Oncology History  Rectal cancer (HCC)  04/20/2020 Procedure  COLONOSCOPY: Rectal mass beginning at about 2 cm from the anal verge and was noted to obstruct 50% of the circumference of the rectum.  Benign polyps were also present and removed.   04/20/2020 Pathology Results   Rectum, biopsy:  -  Invasive adenocarcinoma    04/22/2020 Cancer Staging   Staging form: Colon and Rectum, AJCC 8th Edition - Clinical stage from 04/22/2020: Stage IIIB (cT3, cN1b, cM0) - Signed by Dellia Beckwith, MD on 06/04/2020   04/23/2020 Initial Diagnosis   Rectal cancer (HCC)    04/24/2020 Imaging   CT ABDOMEN AND PELVIS WITH CONTRAST: 1. Known rectal lesion not well demonstrated by CT. There are several small perirectal and presacral lymph nodes measuring up to 8 mm short axis. These are highly concerning for metastatic involvement. 2. Right middle lobe pulmonary nodules, stable 2016 and consistent with benign etiology. 3. 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 4. Aortic Atherosclerosis (ICD10-I70.0).   05/17/2020 - 06/26/2020 Radiation Therapy   1. Pelvis:  4500 cGy in 25 fractions 2. Rectal boost:  540 cGy in 3 fractions   05/21/2020 - 06/22/2020 Chemotherapy   Concurrent chemoradiation with infusional 5 FU       08/22/2020 Pathology Results   A. COLON, SIGMOID, RECTUM, ANUS, RESECTION:  - Invasive colorectal adenocarcinoma, 3 cm.  - Carcinoma extends into perirectal connective tissue.  - Nineteen benign lymph nodes (0/19).  - Margins not involved.    08/31/2020 Cancer Staging   Staging form: Colon and Rectum, AJCC 8th Edition - Pathologic stage from 08/31/2020: Stage IIA (ypT3, pN0, cM0) - Signed by Dellia Beckwith, MD on 01/14/2021 Histopathologic type: Adenocarcinoma, NOS Stage prefix: Post-therapy Response to neoadjuvant therapy: Partial response Total positive nodes: 0 Total nodes examined: 19 Histologic grading system: 4 grade system Histologic grade (G): G2 Residual tumor (R): R0 - None Tumor size (mm): 30 Lymph-vascular invasion (LVI): LVI not present (absent)/not identified Diagnostic confirmation: Positive histology PLUS positive immunophenotyping and/or positive genetic studies Specimen type: Excision Staged by: Managing physician Tumor deposits (TD): Absent Perineural invasion (PNI): Absent Stage used in treatment planning: Yes National guidelines used in treatment planning: Yes Type of national guideline used in treatment planning: NCCN Staging comments: Rec FOLFOX chemotherapy x 4 months   10/09/2020  - 02/14/2021 Chemotherapy   Patient is on Treatment Plan : COLORECTAL FOLFOX q14d x 4 months     Hypokalemia   INTERVAL HISTORY:  Tanya Mcclure is here today for repeat clinical assessment for her clinical stage IIIB rectal cancer. Patient states that she feels well and has no complaints of pain. She informed me the that the supply company for her colostomy bags needs updated information and I will complete that for her. Her last colonoscopy was done on 10/21/2022 which was normal. She was recommended a 3 year follow-up. She informed me that when taking her Ozempic it caused nausea and vomiting, she does note not eating a healthy diet as vegetables are not her favorite and that she may have to reduce her dosage. I advised she contact and discuss this with her PCP and will refer her to a dietician. She has a WBC of 4.6, hemoglobin of 13.6, and platelet count of 239,000. Her CMP is normal other than a elevated glucose of 397 and sodium of 134. Her CEA today is pending. She had her port flushed today. She is past due for her annual mammogram and I will schedule that for her today. I will see her back in  6 months with CBC, CMP, CEA, and port flush. I informed her that she can have her port removed when she is ready at any time.   She denies signs of infection such as sore throat, sinus drainage, cough, or urinary symptoms.  She denies fevers or recurrent chills. She denies pain. She denies chest pain, dyspnea, or cough. Her appetite is great and her weight has increased 3 pounds over last 4 months .   REVIEW OF SYSTEMS:  Review of Systems  Constitutional: Negative.  Negative for appetite change, chills, diaphoresis, fatigue, fever and unexpected weight change.  HENT:  Negative.  Negative for hearing loss, lump/mass, mouth sores, nosebleeds, sore throat, tinnitus, trouble swallowing and voice change.   Eyes: Negative.  Negative for eye problems and icterus.  Respiratory: Negative.  Negative for chest tightness,  cough, hemoptysis, shortness of breath and wheezing.   Cardiovascular: Negative.  Negative for chest pain, leg swelling and palpitations.  Gastrointestinal: Negative.  Negative for abdominal distention, abdominal pain, blood in stool, constipation, diarrhea, nausea, rectal pain and vomiting.  Endocrine: Negative.   Genitourinary: Negative.  Negative for bladder incontinence, difficulty urinating, dyspareunia, dysuria, frequency, hematuria, menstrual problem, nocturia, pelvic pain, vaginal bleeding and vaginal discharge.   Musculoskeletal: Negative.  Negative for arthralgias, back pain, flank pain, gait problem, myalgias, neck pain and neck stiffness.  Skin: Negative.  Negative for rash.  Neurological:  Negative for dizziness, extremity weakness, gait problem, headaches, light-headedness, numbness, seizures and speech difficulty.  Hematological: Negative.  Negative for adenopathy. Does not bruise/bleed easily.  Psychiatric/Behavioral: Negative.  Negative for confusion, decreased concentration, depression, sleep disturbance and suicidal ideas. The patient is not nervous/anxious.    VITALS:  Blood pressure 129/73, pulse 79, temperature 98.1 F (36.7 C), temperature source Oral, resp. rate 18, height 5\' 3"  (1.6 m), weight 187 lb 14.4 oz (85.2 kg), SpO2 98%.  Wt Readings from Last 3 Encounters:  07/24/23 187 lb 14.4 oz (85.2 kg)  03/27/23 184 lb 11.2 oz (83.8 kg)  11/26/22 186 lb 14.4 oz (84.8 kg)    Body mass index is 33.28 kg/m.  Performance status (ECOG): 1 - Symptomatic but completely ambulatory  PHYSICAL EXAM:  Physical Exam Vitals and nursing note reviewed.  Constitutional:      General: She is not in acute distress.    Appearance: Normal appearance.  HENT:     Head: Normocephalic and atraumatic.     Right Ear: Tympanic membrane, ear canal and external ear normal. There is no impacted cerumen.     Left Ear: Tympanic membrane, ear canal and external ear normal. There is no impacted  cerumen.     Nose: Nose normal. No congestion or rhinorrhea.     Mouth/Throat:     Mouth: Mucous membranes are moist.     Pharynx: Oropharynx is clear. No oropharyngeal exudate or posterior oropharyngeal erythema.  Eyes:     General: No scleral icterus.       Right eye: No discharge.        Left eye: No discharge.     Extraocular Movements: Extraocular movements intact.     Conjunctiva/sclera: Conjunctivae normal.     Pupils: Pupils are equal, round, and reactive to light.  Cardiovascular:     Rate and Rhythm: Normal rate and regular rhythm.     Pulses: Normal pulses.     Heart sounds: Normal heart sounds. No murmur heard.    No friction rub. No gallop.  Pulmonary:     Effort: Pulmonary effort  is normal. No respiratory distress.     Breath sounds: Normal breath sounds. No stridor. No wheezing, rhonchi or rales.  Chest:     Chest wall: No tenderness.  Abdominal:     General: Bowel sounds are normal. There is no distension.     Palpations: Abdomen is soft. There is no hepatomegaly, splenomegaly or mass.     Tenderness: There is no abdominal tenderness. There is no right CVA tenderness, left CVA tenderness, guarding or rebound.     Hernia: No hernia is present.     Comments: Colostomy in the left mid abdomen is function well, looks pink and healthy  Musculoskeletal:        General: Normal range of motion.     Cervical back: Normal range of motion and neck supple. No tenderness.     Right lower leg: No edema.     Left lower leg: No edema.  Lymphadenopathy:     Cervical: No cervical adenopathy.     Upper Body:     Right upper body: No supraclavicular or axillary adenopathy.     Left upper body: No supraclavicular or axillary adenopathy.     Lower Body: No right inguinal adenopathy. No left inguinal adenopathy.  Skin:    General: Skin is warm and dry.     Coloration: Skin is not jaundiced.     Findings: No rash.  Neurological:     General: No focal deficit present.     Mental  Status: She is alert and oriented to person, place, and time. Mental status is at baseline.     Cranial Nerves: No cranial nerve deficit.     Sensory: No sensory deficit.     Motor: No weakness.     Coordination: Coordination normal.     Gait: Gait normal.     Deep Tendon Reflexes: Reflexes normal.  Psychiatric:        Mood and Affect: Mood normal.        Behavior: Behavior normal.        Thought Content: Thought content normal.        Judgment: Judgment normal.    LABS:      Latest Ref Rng & Units 07/24/2023   10:32 AM 03/27/2023   10:55 AM 11/26/2022   11:06 AM  CBC  WBC 4.0 - 10.5 K/uL 4.6  5.3  5.0   Hemoglobin 12.0 - 15.0 g/dL 16.1  09.6  04.5   Hematocrit 36.0 - 46.0 % 38.7  38.9  41.7   Platelets 150 - 400 K/uL 239  241  221       Latest Ref Rng & Units 07/24/2023   10:32 AM 03/27/2023   10:55 AM 11/26/2022   11:06 AM  CMP  Glucose 70 - 99 mg/dL 409  811  914   BUN 8 - 23 mg/dL 9  17  17    Creatinine 0.44 - 1.00 mg/dL 7.82  9.56  2.13   Sodium 135 - 145 mmol/L 134  137  135   Potassium 3.5 - 5.1 mmol/L 4.0  3.8  3.8   Chloride 98 - 111 mmol/L 97  99  99   CO2 22 - 32 mmol/L 25  26  26    Calcium 8.9 - 10.3 mg/dL 9.5  9.5  9.1   Total Protein 6.5 - 8.1 g/dL 7.6  7.7  7.8   Total Bilirubin 0.0 - 1.2 mg/dL 0.4  0.5  0.5   Alkaline Phos 38 - 126 U/L  65  60  45   AST 15 - 41 U/L 22  21  21    ALT 0 - 44 U/L 18  12  19     Lab Results  Component Value Date   CEA1 1.8 11/26/2022   CEA 2.46 07/24/2023   /  CEA  Date Value Ref Range Status  11/26/2022 1.8 0.0 - 4.7 ng/mL Final    Comment:    (NOTE)                             Nonsmokers          <3.9                             Smokers             <5.6 Roche [employer]ostics Electrochemiluminescence Immunoassay (ECLIA) Values obtained with different assay methods or kits cannot be used interchangeably.  Results cannot be interpreted as absolute evidence of the presence or absence of malignant disease. Performed At:  [facility] [address] [place], [place] [id number] Jolene Schimke MD NW:[id number]    CEA [facility])  Date Value Ref Range Status  07/24/2023 2.46 0.00 - 5.00 ng/mL Final    Comment:    (NOTE) This test was performed using Beckman Coulter's paramagnetic chemiluminescent immunoassay. Values obtained from different assay methods cannot be used interchangeably. Please note that up to 8% of patients who smoke may see values 5.1-10.0 ng/ml and 1% of patients who smoke may see CEA levels >10.0 ng/ml. Performed at [facility], [address] [place], [place] [address]    STUDIES:  No results found.    HISTORY:   Past Medical History:  Diagnosis Date   Arthritis    Asthma    Cancer (HCC) 04/2020   colon   Diabetes mellitus without complication (HCC)    Dyspnea    walking,   Family history of adverse reaction to anesthesia    daughter PONV   GERD (gastroesophageal reflux disease)    Hypertension    Hypothyroidism    Thyroid disease     Past Surgical History:  Procedure Laterality Date   ABDOMINAL HYSTERECTOMY     due to fibroids   CHOLECYSTECTOMY     HYSTERECTOMY ABDOMINAL WITH SALPINGO-OOPHORECTOMY     POLYPECTOMY     PORTA CATH INSERTION     TUBAL LIGATION     XI ROBOTIC ASSISTED LOWER ANTERIOR RESECTION N/A 08/22/2020   Procedure: XI ROBOTIC ABDOMINAL PERIITONEAL RESECTION;  Surgeon: Romie Levee, MD;  Location: [facility];  Service: General;  Laterality: N/A;  4 HOURS    Family History  Problem Relation Age of Onset   Diabetes Mother    Hypertension Mother    Heart disease Mother    Heart attack Mother    Diabetes Father    Hypertension Father    Diabetes Sister    COPD Sister     Social History:  reports that she has never smoked. She has never used smokeless tobacco. She reports that she does not currently use alcohol. She reports that she does not use drugs.The patient is alone today.  Allergies:  Allergies  Allergen  Reactions   Penicillins Rash    Current Medications: Current Outpatient Medications  Medication Sig Dispense Refill   Accu-Chek Softclix Lancets lancets USE TO CHECK BLOOD GLUCOSE TWICE DAILY.     albuterol (VENTOLIN  HFA) 108 (90 Base) MCG/ACT inhaler Inhale 1-2 puffs into the lungs every 6 (six) hours as needed.     atorvastatin (LIPITOR) 40 MG tablet Take 40 mg by mouth daily.     Blood Glucose Monitoring Suppl (ACCU-CHEK GUIDE ME) w/Device KIT See admin instructions.     Blood Glucose Monitoring Suppl (GLUCOCOM BLOOD GLUCOSE MONITOR) DEVI 1 each by Misc.(Non-Drug; Combo Route) route daily.     budesonide-formoterol (SYMBICORT) 160-4.5 MCG/ACT inhaler Inhale into the lungs. (Patient not taking: Reported on 03/27/2023)     celecoxib (CELEBREX) 200 MG capsule Take 200 mg by mouth 2 (two) times daily as needed.     furosemide (LASIX) 20 MG tablet Take 1 tablet by mouth daily.     gabapentin (NEURONTIN) 400 MG capsule Take 400 mg by mouth 3 (three) times daily.     glucose blood (ACCU-CHEK AVIVA PLUS) test strip Check blood glucose twice daily and as needed. Dx E 11.65     hydrochlorothiazide (MICROZIDE) 12.5 MG capsule Take 12.5 mg by mouth daily.     Lancets Misc. (ACCU-CHEK FASTCLIX LANCET) KIT Check blood glucose twice daily. Dx E 11.65     levocetirizine (XYZAL) 5 MG tablet Take 5 mg by mouth daily. (Patient not taking: Reported on 06/26/2022)     levothyroxine (SYNTHROID) 125 MCG tablet Take 125 mcg by mouth daily.     lisinopril (ZESTRIL) 5 MG tablet Take 5 mg by mouth daily.     metFORMIN (GLUCOPHAGE-XR) 500 MG 24 hr tablet Take 2 tablets by mouth daily with breakfast.     montelukast (SINGULAIR) 10 MG tablet Take 1 tablet by mouth daily.     NEOMYCIN-POLYMYXIN-HYDROCORTISONE (CORTISPORIN) 1 % SOLN OTIC solution INSTILL 3 DROPS TO RIGHT EAR FOUR TIMES DAILY 10 mL 0   ondansetron (ZOFRAN) 4 MG tablet Take 1 tablet (4 mg total) by mouth every 4 (four) hours as needed for nausea. 90 tablet  0   OZEMPIC, 1 MG/DOSE, 4 MG/3ML SOPN Inject 1 mg into the skin once a week.     pantoprazole (PROTONIX) 40 MG tablet Take 1 tablet (40 mg total) by mouth 2 (two) times daily. 180 tablet 3   pioglitazone (ACTOS) 30 MG tablet Take 30 mg by mouth daily.     prochlorperazine (COMPAZINE) 10 MG tablet Take 10 mg by mouth every 6 (six) hours as needed.     Current Facility-Administered Medications  Medication Dose Route Frequency Provider Last Rate Last Admin   triamcinolone acetonide (KENALOG) 10 MG/ML injection 10 mg  10 mg Other Once Asencion Islam, DPM       triamcinolone acetonide (KENALOG-40) injection 20 mg  20 mg Other Once Asencion Islam, DPM        I,Jasmine M Lassiter,acting as a scribe for Dellia Beckwith, MD.,have documented all relevant documentation on the behalf of Dellia Beckwith, MD,as directed by  Dellia Beckwith, MD while in the presence of Dellia Beckwith, MD.

## 2023-07-24 ENCOUNTER — Telehealth: Payer: Self-pay | Admitting: Oncology

## 2023-07-24 ENCOUNTER — Other Ambulatory Visit: Payer: Self-pay | Admitting: Oncology

## 2023-07-24 ENCOUNTER — Encounter: Payer: Self-pay | Admitting: Oncology

## 2023-07-24 ENCOUNTER — Inpatient Hospital Stay: Payer: Medicaid Other | Attending: Oncology

## 2023-07-24 ENCOUNTER — Inpatient Hospital Stay (HOSPITAL_BASED_OUTPATIENT_CLINIC_OR_DEPARTMENT_OTHER): Payer: Medicaid Other | Admitting: Oncology

## 2023-07-24 VITALS — BP 129/73 | HR 79 | Temp 98.1°F | Resp 18 | Ht 63.0 in | Wt 187.9 lb

## 2023-07-24 DIAGNOSIS — Z85038 Personal history of other malignant neoplasm of large intestine: Secondary | ICD-10-CM | POA: Diagnosis present

## 2023-07-24 DIAGNOSIS — E1140 Type 2 diabetes mellitus with diabetic neuropathy, unspecified: Secondary | ICD-10-CM | POA: Diagnosis not present

## 2023-07-24 DIAGNOSIS — C20 Malignant neoplasm of rectum: Secondary | ICD-10-CM

## 2023-07-24 DIAGNOSIS — Z1239 Encounter for other screening for malignant neoplasm of breast: Secondary | ICD-10-CM

## 2023-07-24 DIAGNOSIS — Z79899 Other long term (current) drug therapy: Secondary | ICD-10-CM | POA: Insufficient documentation

## 2023-07-24 LAB — CEA (ACCESS): CEA (CHCC): 2.46 ng/mL (ref 0.00–5.00)

## 2023-07-24 LAB — CMP (CANCER CENTER ONLY)
ALT: 18 U/L (ref 0–44)
AST: 22 U/L (ref 15–41)
Albumin: 4.4 g/dL (ref 3.5–5.0)
Alkaline Phosphatase: 65 U/L (ref 38–126)
Anion gap: 12 (ref 5–15)
BUN: 9 mg/dL (ref 8–23)
CO2: 25 mmol/L (ref 22–32)
Calcium: 9.5 mg/dL (ref 8.9–10.3)
Chloride: 97 mmol/L — ABNORMAL LOW (ref 98–111)
Creatinine: 0.81 mg/dL (ref 0.44–1.00)
GFR, Estimated: >60 mL/min (ref >60)
Glucose, Bld: 397 mg/dL — ABNORMAL HIGH (ref 70–99)
Potassium: 4 mmol/L (ref 3.5–5.1)
Sodium: 134 mmol/L — ABNORMAL LOW (ref 135–145)
Total Bilirubin: 0.4 mg/dL (ref 0.0–1.2)
Total Protein: 7.6 g/dL (ref 6.5–8.1)

## 2023-07-24 LAB — CBC WITH DIFFERENTIAL (CANCER CENTER ONLY)
Abs Immature Granulocytes: 0.07 10*3/uL (ref 0.00–0.07)
Basophils Absolute: 0.1 10*3/uL (ref 0.0–0.1)
Basophils Relative: 2 %
Eosinophils Absolute: 0.2 10*3/uL (ref 0.0–0.5)
Eosinophils Relative: 4 %
HCT: 38.7 % (ref 36.0–46.0)
Hemoglobin: 13.6 g/dL (ref 12.0–15.0)
Immature Granulocytes: 2 %
Lymphocytes Relative: 22 %
Lymphs Abs: 1 10*3/uL (ref 0.7–4.0)
MCH: 29.7 pg (ref 26.0–34.0)
MCHC: 35.1 g/dL (ref 30.0–36.0)
MCV: 84.5 fL (ref 80.0–100.0)
Monocytes Absolute: 0.4 10*3/uL (ref 0.1–1.0)
Monocytes Relative: 10 %
Neutro Abs: 2.8 10*3/uL (ref 1.7–7.7)
Neutrophils Relative %: 60 %
Platelet Count: 239 10*3/uL (ref 150–400)
RBC: 4.58 MIL/uL (ref 3.87–5.11)
RDW: 12.8 % (ref 11.5–15.5)
WBC Count: 4.6 10*3/uL (ref 4.0–10.5)
nRBC: 0 % (ref 0.0–0.2)
nRBC: 0 /100{WBCs}

## 2023-07-24 NOTE — Telephone Encounter (Signed)
 Contacted pt to schedule an appt. Unable to reach via phone, voicemail was left.    Follow-Up Information  Follow-up disposition: Return in about 6 months (around 01/27/2024).  Check out comments: Sched bilat screening mammogram  RT 6 months with labs and port flush

## 2023-07-29 ENCOUNTER — Telehealth: Payer: Self-pay | Admitting: Dietician

## 2023-07-29 NOTE — Telephone Encounter (Signed)
 Patient referred for assistance with blood sugar control, per MD she isn't tolerating Ozempic. First attempt to reach. Provided my cell# on voice mail to return call to set up a nutrition consult for assessment of access to resources.Gennaro Africa, RDN, LDN Registered Dietitian, [facility] Part Time Remote (Usual office hours: Tuesday-Thursday) Cell: [phone]

## 2023-08-03 ENCOUNTER — Encounter: Payer: Self-pay | Admitting: Hematology and Oncology

## 2023-08-04 ENCOUNTER — Telehealth: Payer: Self-pay | Admitting: Dietician

## 2023-08-04 NOTE — Telephone Encounter (Signed)
 Patient referred for assistance with blood sugar control, per MD she isn't tolerating Ozempic. Second attempt to reach. Provided my cell# on voice mail and in text to return call to set up a nutrition consult for assessment of access to resources.Gennaro Africa, RDN, LDN Registered Dietitian, [facility] Part Time Remote (Usual office hours: Tuesday-Thursday) Cell: [phone]

## 2023-08-11 ENCOUNTER — Telehealth: Payer: Self-pay | Admitting: Dietician

## 2023-08-11 NOTE — Telephone Encounter (Signed)
 Patient sent text in response tomy text last week that a better # to reach her was [phone].  Tried to call that # but the call wouldn't go through.  Sent another text requesting her to call for a nutrition consult.  Gennaro Africa, RDN, LDN Registered Dietitian, [facility] Part Time Remote (Usual office hours: Tuesday-Thursday) Mobile: [phone] Remote Office: [phone]

## 2023-08-13 ENCOUNTER — Telehealth: Payer: Self-pay | Admitting: Dietician

## 2023-08-13 NOTE — Telephone Encounter (Signed)
 Fourth and final attempt to reach patient by telephone. Have left messages with return number. Please consult RD for future needs.  I also forwarded the information for the Virtual Nutrition Class with contact information to her email.  Gennaro Africa, RDN, LDN Registered Dietitian, [facility] Part Time Remote (Usual office hours: Tuesday-Thursday) Mobile: [phone] Remote Office: [phone]

## 2023-08-20 ENCOUNTER — Inpatient Hospital Stay: Attending: Oncology | Admitting: Dietician

## 2023-08-20 NOTE — Progress Notes (Signed)
 Nutrition Assessment: Patient responded to email sent attempting to contact with new mobile# [phone]  Reason for Assessment: MD referral for blood sugars   ASSESSMENT: Patient is a 63 year old female under surveillance after PMHx rectal cancer. Dr. Gilman Buttner concern with blood sugar control as last visit her glucose significantly elevated and diet recall reveals no vegetables and  snacks of ultra processed foods. Patient relayed that she has never had DM diet education.  She doesn't like most vegetables. She is raising 3 teenage grandchildren and is motivated to be around for their sake.  Usual food she enjoys:  Geophysical data processor of fish pork chops ribs, tuna, lots of sandwiches( tuna, peanut butter, ham bologna) will do tomatoes, on side or on sandwiches, also likes cucumbers, salads green leafy, fried okra but doesn't eat these vegetable choices often Fluids: 2% milk, regular sodas (dark cola types ) trying to give these up sips on  20oz./day , will drink Aqu[employer] and [employer].   Medications: Had trouble getting DM meds, PCP hadn't seen her, she is back on Metformin and Ozempic.   Labs: 07/24/23  Glucose 397   Anthropometrics: no significant changes, CBW obese  Height: 63" Weight: 187.8# UBW: 180-185# BMI: 33.28   NUTRITION DIAGNOSIS: Food and Nutrition Related Knowledge Deficit related to diabetes and associated treatments as evidenced by no prior access to nutrition related information.   INTERVENTION:   Relayed that one on one MNT nutrition services is cancer centers are intended for patients  undergoing treatments.  Discussed strategies for eliminating sugary beverages and sweets.  Emailed Nutrition Tip sheet  for plate method for porting macro nutrients, ass well as Diabetes and nutrition from [facility].  Encouraged her to have PCP make referral to a multidisciplinary DMSM program   Relayed information about Virtual Nutrition Class if ready to explore risk reduction efforts  and more plant based options.  Has contact information to reach out if she needs Dr. Gilman Buttner to refer to [facility] CDEs.  MONITORING, EVALUATION, GOAL: weight trends, nutrition impact symptoms, PO intake, labs   Next Visit: PRN at patient or provider request.  Gennaro Africa, RDN, LDN Registered Dietitian, [facility] Part Time Remote (Usual office hours: Tuesday-Thursday) Mobile: [phone]

## 2023-11-11 ENCOUNTER — Encounter (HOSPITAL_BASED_OUTPATIENT_CLINIC_OR_DEPARTMENT_OTHER): Payer: Self-pay

## 2024-01-27 ENCOUNTER — Inpatient Hospital Stay: Attending: Oncology | Admitting: Oncology

## 2024-01-27 ENCOUNTER — Inpatient Hospital Stay

## 2024-01-27 ENCOUNTER — Telehealth: Payer: Self-pay | Admitting: Oncology

## 2024-01-27 ENCOUNTER — Encounter: Payer: Self-pay | Admitting: Hematology and Oncology

## 2024-01-27 ENCOUNTER — Other Ambulatory Visit: Payer: Self-pay | Admitting: Oncology

## 2024-01-27 ENCOUNTER — Encounter: Payer: Self-pay | Admitting: Oncology

## 2024-01-27 VITALS — BP 135/66 | HR 63 | Temp 98.1°F | Resp 16 | Ht 63.0 in | Wt 183.2 lb

## 2024-01-27 DIAGNOSIS — Z923 Personal history of irradiation: Secondary | ICD-10-CM | POA: Insufficient documentation

## 2024-01-27 DIAGNOSIS — T451X5A Adverse effect of antineoplastic and immunosuppressive drugs, initial encounter: Secondary | ICD-10-CM

## 2024-01-27 DIAGNOSIS — C20 Malignant neoplasm of rectum: Secondary | ICD-10-CM

## 2024-01-27 DIAGNOSIS — G620 Drug-induced polyneuropathy: Secondary | ICD-10-CM

## 2024-01-27 DIAGNOSIS — Z08 Encounter for follow-up examination after completed treatment for malignant neoplasm: Secondary | ICD-10-CM | POA: Insufficient documentation

## 2024-01-27 DIAGNOSIS — Z85048 Personal history of other malignant neoplasm of rectum, rectosigmoid junction, and anus: Secondary | ICD-10-CM | POA: Insufficient documentation

## 2024-01-27 DIAGNOSIS — E1140 Type 2 diabetes mellitus with diabetic neuropathy, unspecified: Secondary | ICD-10-CM | POA: Diagnosis not present

## 2024-01-27 DIAGNOSIS — Z79899 Other long term (current) drug therapy: Secondary | ICD-10-CM | POA: Diagnosis not present

## 2024-01-27 DIAGNOSIS — Z9221 Personal history of antineoplastic chemotherapy: Secondary | ICD-10-CM | POA: Diagnosis not present

## 2024-01-27 DIAGNOSIS — K635 Polyp of colon: Secondary | ICD-10-CM | POA: Insufficient documentation

## 2024-01-27 LAB — CBC WITH DIFFERENTIAL (CANCER CENTER ONLY)
Abs Immature Granulocytes: 0.04 K/uL (ref 0.00–0.07)
Basophils Absolute: 0 K/uL (ref 0.0–0.1)
Basophils Relative: 1 %
Eosinophils Absolute: 0.2 K/uL (ref 0.0–0.5)
Eosinophils Relative: 4 %
HCT: 40.8 % (ref 36.0–46.0)
Hemoglobin: 14.1 g/dL (ref 12.0–15.0)
Immature Granulocytes: 1 %
Lymphocytes Relative: 21 %
Lymphs Abs: 0.9 K/uL (ref 0.7–4.0)
MCH: 29.1 pg (ref 26.0–34.0)
MCHC: 34.6 g/dL (ref 30.0–36.0)
MCV: 84.3 fL (ref 80.0–100.0)
Monocytes Absolute: 0.4 K/uL (ref 0.1–1.0)
Monocytes Relative: 9 %
Neutro Abs: 2.7 K/uL (ref 1.7–7.7)
Neutrophils Relative %: 64 %
Platelet Count: 223 K/uL (ref 150–400)
RBC: 4.84 MIL/uL (ref 3.87–5.11)
RDW: 12.7 % (ref 11.5–15.5)
WBC Count: 4.3 K/uL (ref 4.0–10.5)
nRBC: 0 % (ref 0.0–0.2)

## 2024-01-27 LAB — CMP (CANCER CENTER ONLY)
ALT: 19 U/L (ref 0–44)
AST: 25 U/L (ref 15–41)
Albumin: 4 g/dL (ref 3.5–5.0)
Alkaline Phosphatase: 60 U/L (ref 38–126)
Anion gap: 12 (ref 5–15)
BUN: 12 mg/dL (ref 8–23)
CO2: 25 mmol/L (ref 22–32)
Calcium: 9.2 mg/dL (ref 8.9–10.3)
Chloride: 98 mmol/L (ref 98–111)
Creatinine: 0.74 mg/dL (ref 0.44–1.00)
GFR, Estimated: >60 mL/min (ref >60)
Glucose, Bld: 316 mg/dL — ABNORMAL HIGH (ref 70–99)
Potassium: 4.2 mmol/L (ref 3.5–5.1)
Sodium: 134 mmol/L — ABNORMAL LOW (ref 135–145)
Total Bilirubin: 0.4 mg/dL (ref 0.0–1.2)
Total Protein: 7.7 g/dL (ref 6.5–8.1)

## 2024-01-27 LAB — CEA (ACCESS): CEA (CHCC): 2.4 ng/mL (ref 0.00–5.00)

## 2024-01-27 NOTE — Progress Notes (Signed)
 [facility]  [address] [place],  [place]  [address] [phone]  Clinic Day:  01/27/24  Referring physician: Brinda Fish I, NP  ASSESSMENT & PLAN:  Assessment: Rectal cancer [facility]) Clinical stage IIIB ([id number]) rectal cancer, diagnosed in December 2021. CT revealed several small perirectal and presacral lymph nodes, which were highly concerning for metastatic involvement. She received neoadjuvant chemotherapy followed by abdominoperitoneal resection in April 2022 and lymph nodes were negative. She completed 8 cycles of adjuvant FOLFOX chemotherapy in early October 2022. She underwent EGD and colonoscopy in June of 2024 with Dr. Larene.  Colonoscopy was normal, follow-up in 3 years was recommended.  She found to have gastritis with some oozing of blood and a small hiatal hernia.  She continues pantoprazole . She remains without evidence of recurrence.   Grade 1 neuropathy She continues gabapentin  300 mg BID.   Diabetes, poorly controlled She is not following a diet and doesn't like vegetables and her glucose is out of control. We will have the dietician contact her but it will be a challenge to change her eating habits. I will send a referral to Dr. Dottie.   Small Polypoid Lesion of Stoma This appears benign but does easily bleed when irritated and causes mild discomfort. I told her there was no urgency but perhaps Dr. Larene can remove this when he does her next colonoscopy in June, 2027.   Plan: She informed me that she is past due for her annual mammogram and I will schedule that for her now. She informed me that she has been having trouble with controlling her glucose levels and when she urinates it has a sticky like consistency. She denies any hematuria. She has a WBC of 4.3, hemoglobin of 14.1, and platelet count of 223,000. Her CMP is normal other than an mildly low sodium of 134 and elevated glucose of 316. She is not following up with a primary care provider  so I will send a referral to Dr. Dottie at [facility]. Her CEA today is pending. I will see her back in 6 months with CBC, CMP, CEA, and port flush. I informed her that she can have her port removed at anytime The patient understands the plans discussed today and is in agreement with them.  She knows to contact our office if she develops concerns prior to her next appointment.  I provided 9 minutes of face-to-face time during this encounter and > 50% was spent counseling as documented under my assessment and plan.   Tanya VEAR Cornish, MD [facility] - A DEPT OF [facility] [address] [place] [address] Dept: [phone] Dept Fax: [phone]   No orders of the defined types were placed in this encounter.   CHIEF COMPLAINT:  CC: Clinical stage IIIB rectal cancer  Current Treatment: Observation  HISTORY OF PRESENT ILLNESS:   Oncology History  Rectal cancer (HCC)  04/20/2020 Procedure   COLONOSCOPY: Rectal mass beginning at about 2 cm from the anal verge and was noted to obstruct 50% of the circumference of the rectum.  Benign polyps were also present and removed.   04/20/2020 Pathology Results   Rectum, biopsy:  -  Invasive adenocarcinoma    04/22/2020 Cancer Staging   Staging form: Colon and Rectum, AJCC 8th Edition - Clinical stage from 04/22/2020: Stage IIIB (cT3, cN1b, cM0) - Signed by Mcclure Tanya VEAR, MD on 06/04/2020   04/23/2020 Initial Diagnosis   Rectal cancer (  HCC)   04/24/2020 Imaging   CT ABDOMEN AND PELVIS WITH CONTRAST: 1. Known rectal lesion not well demonstrated by CT. There are several small perirectal and presacral lymph nodes measuring up to 8 mm short axis. These are highly concerning for metastatic involvement. 2. Right middle lobe pulmonary nodules, stable 2016 and consistent with benign etiology. 3. 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 4.  Aortic Atherosclerosis (ICD10-I70.0).   05/17/2020 - 06/26/2020 Radiation Therapy   1. Pelvis:  4500 cGy in 25 fractions 2. Rectal boost:  540 cGy in 3 fractions   05/21/2020 - 06/22/2020 Chemotherapy   Concurrent chemoradiation with infusional 5 FU       08/22/2020 Pathology Results   A. COLON, SIGMOID, RECTUM, ANUS, RESECTION:  - Invasive colorectal adenocarcinoma, 3 cm.  - Carcinoma extends into perirectal connective tissue.  - Nineteen benign lymph nodes (0/19).  - Margins not involved.    08/31/2020 Cancer Staging   Staging form: Colon and Rectum, AJCC 8th Edition - Pathologic stage from 08/31/2020: Stage IIA (ypT3, pN0, cM0) - Signed by Cornelius Tanya DEL, MD on 01/14/2021 Histopathologic type: Adenocarcinoma, NOS Stage prefix: Post-therapy Response to neoadjuvant therapy: Partial response Total positive nodes: 0 Total nodes examined: 19 Histologic grading system: 4 grade system Histologic grade (G): G2 Residual tumor (R): R0 - None Tumor size (mm): 30 Lymph-vascular invasion (LVI): LVI not present (absent)/not identified Diagnostic confirmation: Positive histology PLUS positive immunophenotyping and/or positive genetic studies Specimen type: Excision Staged by: Managing physician Tumor deposits (TD): Absent Perineural invasion (PNI): Absent Stage used in treatment planning: Yes National guidelines used in treatment planning: Yes Type of national guideline used in treatment planning: NCCN Staging comments: Rec FOLFOX chemotherapy x 4 months   10/09/2020 - 02/14/2021 Chemotherapy   Patient is on Treatment Plan : COLORECTAL FOLFOX q14d x 4 months     Hypokalemia   INTERVAL HISTORY:  Tanya Mcclure is here today for repeat clinical assessment for her clinical stage IIIB rectal cancer. She was treated with chemotherapy, radiation, and surgery and has never had evidence of recurrence, now nearly 4 years out. Patient states that she feels well and has no complaints of pain. She informed  me that she is past due for her annual mammogram and I will schedule that for her now. She informed me that she has been having trouble with controlling her glucose levels and when she urinates it has a sticky like consistency. She denies any hematuria. She has a WBC of 4.3, hemoglobin of 14.1, and platelet count of 223,000. Her CMP is normal other than an mildly low sodium of 134 and elevated glucose of 316. She is not following up with a primary care provider so I will send a referral to Dr. Dottie at [facility]. Her CEA today is pending. I will see her back in 6 months with CBC, CMP, CEA, and port flush. I informed her that she can have her port removed at anytime. She denies fever, chills, night sweats, or other signs of infection. She denies cardiorespiratory and gastrointestinal issues. She  denies pain. Her appetite is good and Her weight has decreased 4.5 pounds over last 6 months.    REVIEW OF SYSTEMS:  Review of Systems  Constitutional: Negative.  Negative for appetite change, chills, diaphoresis, fatigue, fever and unexpected weight change.  HENT:  Negative.  Negative for hearing loss, lump/mass, mouth sores, nosebleeds, sore throat, tinnitus, trouble swallowing and voice change.   Eyes: Negative.  Negative for eye  problems and icterus.  Respiratory: Negative.  Negative for chest tightness, cough, hemoptysis, shortness of breath and wheezing.   Cardiovascular: Negative.  Negative for chest pain, leg swelling and palpitations.  Gastrointestinal: Negative.  Negative for abdominal distention, abdominal pain, blood in stool, constipation, diarrhea, nausea, rectal pain and vomiting.  Endocrine: Negative.   Genitourinary: Negative.  Negative for bladder incontinence, difficulty urinating, dyspareunia, dysuria, frequency, hematuria, menstrual problem, nocturia, pelvic pain, vaginal bleeding and vaginal discharge.   Musculoskeletal:  Positive for arthralgias (of her hands). Negative for back  pain, flank pain, gait problem, myalgias, neck pain and neck stiffness.  Skin: Negative.  Negative for rash.  Neurological:  Negative for dizziness, extremity weakness, gait problem, headaches, light-headedness, numbness, seizures and speech difficulty.  Hematological: Negative.  Negative for adenopathy. Does not bruise/bleed easily.  Psychiatric/Behavioral: Negative.  Negative for confusion, decreased concentration, depression, sleep disturbance and suicidal ideas. The patient is not nervous/anxious.    VITALS:  Blood pressure 135/66, pulse 63, temperature 98.1 F (36.7 C), temperature source Oral, resp. rate 16, height 5' 3 (1.6 m), weight 183 lb 3.2 oz (83.1 kg), SpO2 98%.  Wt Readings from Last 3 Encounters:  02/04/24 183 lb (83 kg)  01/27/24 183 lb 3.2 oz (83.1 kg)  07/24/23 187 lb 14.4 oz (85.2 kg)    Body mass index is 32.45 kg/m.  Performance status (ECOG): 1 - Symptomatic but completely ambulatory  PHYSICAL EXAM:  Physical Exam Vitals and nursing note reviewed.  Constitutional:      General: She is not in acute distress.    Appearance: Normal appearance.  HENT:     Head: Normocephalic and atraumatic.     Right Ear: Tympanic membrane, ear canal and external ear normal. There is no impacted cerumen.     Left Ear: Tympanic membrane, ear canal and external ear normal. There is no impacted cerumen.     Nose: Nose normal. No congestion or rhinorrhea.     Mouth/Throat:     Mouth: Mucous membranes are moist.     Pharynx: Oropharynx is clear. No oropharyngeal exudate or posterior oropharyngeal erythema.  Eyes:     General: No scleral icterus.       Right eye: No discharge.        Left eye: No discharge.     Extraocular Movements: Extraocular movements intact.     Conjunctiva/sclera: Conjunctivae normal.     Pupils: Pupils are equal, round, and reactive to light.  Cardiovascular:     Rate and Rhythm: Normal rate and regular rhythm.     Pulses: Normal pulses.     Heart  sounds: Normal heart sounds. No murmur heard.    No friction rub. No gallop.  Pulmonary:     Effort: Pulmonary effort is normal. No respiratory distress.     Breath sounds: Normal breath sounds. No stridor. No wheezing, rhonchi or rales.  Chest:     Chest wall: No tenderness.  Abdominal:     General: Bowel sounds are normal. There is no distension.     Palpations: Abdomen is soft. There is no hepatomegaly, splenomegaly or mass.     Tenderness: There is no abdominal tenderness. There is no right CVA tenderness, left CVA tenderness, guarding or rebound.     Hernia: No hernia is present.     Comments: Stoma is pink and healthy Small polyp right at about 5 o'clock on the stoma that is bleeding easily (Dr. Larene is aware)  Musculoskeletal:  General: Normal range of motion.     Right hand: Swelling present.     Left hand: Swelling present.     Cervical back: Normal range of motion and neck supple. No tenderness.     Right lower leg: No edema.     Left lower leg: No edema.     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   Lymphadenopathy:     Cervical: No cervical adenopathy.     Upper Body:     Right upper body: No supraclavicular or axillary adenopathy.     Left upper body: No supraclavicular or axillary adenopathy.     Lower Body: No right inguinal adenopathy. No left inguinal adenopathy.  Skin:    General: Skin is warm and dry.     Coloration: Skin is not jaundiced.     Findings: No rash.  Neurological:     General: No focal deficit present.     Mental Status: She is alert and oriented to person, place, and time. Mental status is at baseline.     Cranial Nerves: No cranial nerve deficit.     Sensory: No sensory deficit.     Motor: No weakness.     Coordination: Coordination normal.     Gait: Gait normal.     Deep Tendon Reflexes: Reflexes normal.  Psychiatric:        Mood and Affect: Mood normal.        Behavior: Behavior normal.         Thought Content: Thought content normal.        Judgment: Judgment normal.    LABS:      Latest Ref Rng & Units 02/04/2024   11:42 AM 01/27/2024   10:12 AM 07/24/2023   10:32 AM  CBC  WBC 3.4 - 10.8 x10E3/uL 4.5  4.3  4.6   Hemoglobin 11.1 - 15.9 g/dL 85.2  85.8  86.3   Hematocrit 34.0 - 46.6 % 44.6  40.8  38.7   Platelets 150 - 450 x10E3/uL 243  223  239       Latest Ref Rng & Units 02/04/2024   11:42 AM 01/27/2024   10:12 AM 07/24/2023   10:32 AM  CMP  Glucose 70 - 99 mg/dL 707  683  602   BUN 8 - 27 mg/dL 13  12  9    Creatinine 0.57 - 1.00 mg/dL 9.21  9.25  9.18   Sodium 134 - 144 mmol/L 135  134  134   Potassium 3.5 - 5.2 mmol/L 4.4  4.2  4.0   Chloride 96 - 106 mmol/L 95  98  97   CO2 20 - 29 mmol/L 20  25  25    Calcium  8.7 - 10.3 mg/dL 9.8  9.2  9.5   Total Protein 6.0 - 8.5 g/dL 7.9  7.7  7.6   Total Bilirubin 0.0 - 1.2 mg/dL 0.4  0.4  0.4   Alkaline Phos 49 - 135 IU/L 64  60  65   AST 0 - 40 IU/L 17  25  22    ALT 0 - 32 IU/L 16  19  18     Lab Results  Component Value Date   CEA1 1.8 11/26/2022   CEA 2.40 01/27/2024   /  CEA  Date Value Ref Range Status  11/26/2022 1.8 0.0 - 4.7 ng/mL Final    Comment:    (NOTE)  Nonsmokers          <3.9                             Smokers             <5.6 Roche [employer] Electrochemiluminescence Immunoassay (ECLIA) Values obtained with different assay methods or kits cannot be used interchangeably.  Results cannot be interpreted as absolute evidence of the presence or absence of malignant disease. Performed At: [facility] [address] [place], [place] [id number] Jennette Shorter MD Ey:[phone]    CEA ([facility])  Date Value Ref Range Status  01/27/2024 2.40 0.00 - 5.00 ng/mL Final    Comment:    (NOTE) This test was performed using Beck[employer] Coulter's paramagnetic chemiluminescent immunoassay. Values obtained from different assay methods cannot be used interchangeably. Please  note that up to 8% of patients who smoke may see values 5.1-10.0 ng/ml and 1% of patients who smoke may see CEA levels >10.0 ng/ml. Performed at [facility], [address] [place], [place] [address]    STUDIES:  No results found.    HISTORY:   Past Medical History:  Diagnosis Date   Allergy    Arthritis    Asthma    Cancer (HCC) 04/2020   colon   Diabetes mellitus without complication (HCC)    Dyspnea    walking,   Family history of adverse reaction to anesthesia    daughter PONV   GERD (gastroesophageal reflux disease)    Hypertension    Hypothyroidism    Neuromuscular disorder (HCC)    Thyroid disease     Past Surgical History:  Procedure Laterality Date   ABDOMINAL HYSTERECTOMY     due to fibroids   CHOLECYSTECTOMY     HYSTERECTOMY ABDOMINAL WITH SALPINGO-OOPHORECTOMY     POLYPECTOMY     PORTA CATH INSERTION     TUBAL LIGATION     XI ROBOTIC ASSISTED LOWER ANTERIOR RESECTION N/A 08/22/2020   Procedure: XI ROBOTIC ABDOMINAL PERIITONEAL RESECTION;  Surgeon: Debby Hila, MD;  Location: [facility];  Service: General;  Laterality: N/A;  4 HOURS    Family History  Problem Relation Age of Onset   Diabetes Mother    Hypertension Mother    Heart disease Mother    Heart attack Mother    Diabetes Father    Hypertension Father    Diabetes Sister    COPD Sister    Healthy Sister    Healthy Brother    Healthy Brother    Varicose Veins Daughter    Healthy Daughter    Healthy Son     Social History:  reports that she has never smoked. She has never been exposed to tobacco smoke. She has never used smokeless tobacco. She reports that she does not drink alcohol and does not use drugs.The patient is alone today.  Allergies:  Allergies  Allergen Reactions   Penicillins Rash    Current Medications: Current Outpatient Medications  Medication Sig Dispense Refill   Accu-Chek Softclix Lancets lancets USE TO CHECK BLOOD GLUCOSE TWICE DAILY.      albuterol  (VENTOLIN  HFA) 108 (90 Base) MCG/ACT inhaler Inhale 1-2 puffs into the lungs every 6 (six) hours as needed. 18 g 3   atorvastatin  (LIPITOR) 40 MG tablet Take 1 tablet (40 mg total) by mouth daily. For cholesterol. 90 tablet 3   Blood Glucose Monitoring Suppl (ACCU-CHEK GUIDE ME) w/Device KIT See admin instructions.  empagliflozin  (JARDIANCE ) 10 MG TABS tablet Take 1 tablet (10 mg total) by mouth daily. 90 tablet 3   furosemide  (LASIX ) 20 MG tablet Take 1 tablet by mouth daily.     gabapentin  (NEURONTIN ) 400 MG capsule Take 1 capsule (400 mg total) by mouth 3 (three) times daily. 90 capsule 3   hydrochlorothiazide (MICROZIDE) 12.5 MG capsule Take 12.5 mg by mouth daily.     insulin  glargine (LANTUS  SOLOSTAR) 100 UNIT/ML Solostar Pen Inject 10 Units into the skin daily. 15 mL PRN   levothyroxine  (SYNTHROID ) 125 MCG tablet Take 1 tablet (125 mcg total) by mouth daily before breakfast. 30 tablet 3   losartan  (COZAAR ) 25 MG tablet Take 1 tablet (25 mg total) by mouth daily. For hypertension. 30 tablet 5   metFORMIN  (GLUCOPHAGE -XR) 500 MG 24 hr tablet Week 1: Take 1 tablet by mouth (500 mg) with breakfast daily. Week 2: Take 2 tablets by mouth (1000 mg) with breakfast daily. Week 3: Take 2 tablets by mouth (1000 mg) with breakfast daily and take 1 tablet by mouth (500 mg) with dinner daily. Week 4: Take 2 tablets by mouth (1000 mg) with breakfast daily and take 2 tablet by mouth (1000 mg) with dinner daily. 120 tablet 3   montelukast  (SINGULAIR ) 10 MG tablet Take 1 tablet by mouth daily.     ondansetron  (ZOFRAN ) 4 MG tablet Take 1 tablet (4 mg total) by mouth every 4 (four) hours as needed for nausea. 90 tablet 0   pantoprazole  (PROTONIX ) 40 MG tablet Take 1 tablet (40 mg total) by mouth 2 (two) times daily. 180 tablet 3   pioglitazone (ACTOS) 30 MG tablet Take 30 mg by mouth daily.     prochlorperazine  (COMPAZINE ) 10 MG tablet Take 10 mg by mouth every 6 (six) hours as needed.     Semaglutide   (RYBELSUS ) 3 MG TABS Take 1 tablet (3 mg total) by mouth daily. Will increase to 7 mg after 1 month. 30 tablet 0   Current Facility-Administered Medications  Medication Dose Route Frequency Provider Last Rate Last Admin   triamcinolone  acetonide (KENALOG ) 10 MG/ML injection 10 mg  10 mg Other Once Stover, Titorya, DPM       triamcinolone  acetonide (KENALOG -40) injection 20 mg  20 mg Other Once Stover, Titorya, DPM        I,Jasmine M Lassiter,acting as a scribe for Tanya VEAR Cornish, MD.,have documented all relevant documentation on the behalf of Tanya VEAR Cornish, MD,as directed by  Tanya VEAR Cornish, MD while in the presence of Tanya VEAR Cornish, MD.

## 2024-01-27 NOTE — Telephone Encounter (Signed)
 Patient has been scheduled for follow-up visit per 01/27/24 LOS.  Pt given an appt calendar with date and time.

## 2024-01-29 ENCOUNTER — Ambulatory Visit (HOSPITAL_BASED_OUTPATIENT_CLINIC_OR_DEPARTMENT_OTHER): Admitting: Student

## 2024-02-04 ENCOUNTER — Encounter (HOSPITAL_BASED_OUTPATIENT_CLINIC_OR_DEPARTMENT_OTHER): Payer: Self-pay | Admitting: Student

## 2024-02-04 ENCOUNTER — Other Ambulatory Visit (HOSPITAL_BASED_OUTPATIENT_CLINIC_OR_DEPARTMENT_OTHER): Payer: Self-pay

## 2024-02-04 ENCOUNTER — Ambulatory Visit (INDEPENDENT_AMBULATORY_CARE_PROVIDER_SITE_OTHER): Admitting: Student

## 2024-02-04 VITALS — BP 146/83 | HR 78 | Temp 98.3°F | Resp 16 | Ht 62.4 in | Wt 183.0 lb

## 2024-02-04 DIAGNOSIS — E039 Hypothyroidism, unspecified: Secondary | ICD-10-CM

## 2024-02-04 DIAGNOSIS — C20 Malignant neoplasm of rectum: Secondary | ICD-10-CM

## 2024-02-04 DIAGNOSIS — I10 Essential (primary) hypertension: Secondary | ICD-10-CM | POA: Diagnosis not present

## 2024-02-04 DIAGNOSIS — Z7689 Persons encountering health services in other specified circumstances: Secondary | ICD-10-CM

## 2024-02-04 DIAGNOSIS — Z23 Encounter for immunization: Secondary | ICD-10-CM | POA: Diagnosis not present

## 2024-02-04 DIAGNOSIS — E1142 Type 2 diabetes mellitus with diabetic polyneuropathy: Secondary | ICD-10-CM

## 2024-02-04 DIAGNOSIS — Z7984 Long term (current) use of oral hypoglycemic drugs: Secondary | ICD-10-CM

## 2024-02-04 DIAGNOSIS — G620 Drug-induced polyneuropathy: Secondary | ICD-10-CM

## 2024-02-04 DIAGNOSIS — J4521 Mild intermittent asthma with (acute) exacerbation: Secondary | ICD-10-CM

## 2024-02-04 DIAGNOSIS — T451X5A Adverse effect of antineoplastic and immunosuppressive drugs, initial encounter: Secondary | ICD-10-CM

## 2024-02-04 DIAGNOSIS — E782 Mixed hyperlipidemia: Secondary | ICD-10-CM

## 2024-02-04 LAB — POCT GLYCOSYLATED HEMOGLOBIN (HGB A1C)
HbA1c POC (<> result, manual entry): 13.6 % (ref 4.0–5.6)
Hemoglobin A1C: 13.6 % — AB (ref 4.0–5.6)

## 2024-02-04 MED ORDER — GABAPENTIN 400 MG PO CAPS: 400.0000 mg | ORAL_CAPSULE | Freq: Three times a day (TID) | ORAL | 3 refills | Status: DC

## 2024-02-04 MED ORDER — RYBELSUS 3 MG PO TABS: 3.0000 mg | ORAL_TABLET | Freq: Every day | ORAL | 0 refills | Status: DC

## 2024-02-04 MED ORDER — ATORVASTATIN CALCIUM 40 MG PO TABS: 40.0000 mg | ORAL_TABLET | Freq: Every day | ORAL | 3 refills | Status: AC

## 2024-02-04 MED ORDER — LOSARTAN POTASSIUM 25 MG PO TABS: 25.0000 mg | ORAL_TABLET | Freq: Every day | ORAL | 5 refills | Status: DC

## 2024-02-04 MED ORDER — METFORMIN HCL ER 500 MG PO TB24: ORAL_TABLET | ORAL | 3 refills | Status: DC

## 2024-02-04 MED ORDER — LANTUS SOLOSTAR 100 UNIT/ML ~~LOC~~ SOPN: 10.0000 [IU] | PEN_INJECTOR | Freq: Every day | SUBCUTANEOUS | 99 refills | Status: AC

## 2024-02-04 MED ORDER — ALBUTEROL SULFATE HFA 108 (90 BASE) MCG/ACT IN AERS: 1.0000 | INHALATION_SPRAY | Freq: Four times a day (QID) | RESPIRATORY_TRACT | 3 refills | Status: AC | PRN

## 2024-02-04 MED ORDER — EMPAGLIFLOZIN 10 MG PO TABS: 10.0000 mg | ORAL_TABLET | Freq: Every day | ORAL | 3 refills | Status: AC

## 2024-02-04 MED ORDER — LEVOTHYROXINE SODIUM 125 MCG PO TABS: 125.0000 ug | ORAL_TABLET | Freq: Every day | ORAL | 3 refills | Status: DC

## 2024-02-04 NOTE — Progress Notes (Signed)
 New Patient Office Visit  Subjective    Patient ID: Tanya Mcclure, female    DOB: 1961-03-31  Age: 63 y.o. MRN: [id number]  CC:  Chief Complaint  Patient presents with   Establish Care    Here to establish care.   Diabetes    Has been out of meds for about 3 months. BS ranging 300's. Was not told if she was supposed to take actos with metformin .    Medication Refill    Needs all meds refilled. Cancer center takes care of zofran .    Discussed the use of AI scribe software for clinical note transcription with the patient, who gave verbal consent to proceed.  History of Present Illness   Tanya Mcclure is a 63 year old female with diabetes and hypothyroidism who presents to establish care and manage her diabetes.  She has been out of her diabetes medication for three months due to a change in doctors and dissatisfaction with her previous provider. Her blood sugar was 316 mg/dL last week at the cancer center, and her last A1c was 13.6%. She was previously on Ozempic, which controlled her blood sugar well, but experienced adverse effects with the 1 mg dose. She has not been taking metformin  due to confusion about its use with another medication prescribed by her previous doctor.  She has a history of rectal cancer and has been cancer-free for almost three years following treatment. Her last colonoscopy was last year, and no abnormalities were found. She is followed by Dr. Larene for her colonoscopies.  She has hypothyroidism diagnosed a few years ago and has been out of Synthroid  for some time. She was previously on 125 mcg. She has not been taking it correctly, as she did not take it on an empty stomach with water.  She experiences arthritis pain in her hands and fingers, affecting her ability to hold objects. She has not tried Tylenol  Arthritis yet.  She has asthma, which is generally well-controlled with an inhaler as needed. She does not smoke or drink alcohol, influenced by her  father's alcoholism.  She has a history of bursitis and sciatica, with previous treatments including injections under x-ray guidance, which provided relief. She was previously on gabapentin  400 mg, but her dose was reduced to 100 mg, which she found ineffective.  She has not had a physical exam in a long time.     Screenings:  Colon Cancer: rectal cancer. Clean for 3 years. Colonoscopy updated last year with Dr. Nicolina. Lung Cancer: no smoking or vaping. Breast Cancer: indicated- cancer center is setting up per patient. Diabetes: indicated  Ophthalmology: [facility]  Foot: indicated at next visit. HLD: indicated  The 10-year ASCVD risk score (Arnett DK, et al., 2019) is: 17.5%  Outpatient Encounter Medications as of 02/04/2024  Medication Sig   Accu-Chek Softclix Lancets lancets USE TO CHECK BLOOD GLUCOSE TWICE DAILY.   Blood Glucose Monitoring Suppl (ACCU-CHEK GUIDE ME) w/Device KIT See admin instructions.   Blood Glucose Monitoring Suppl (GLUCOCOM BLOOD GLUCOSE MONITOR) DEVI 1 each by Misc.(Non-Drug; Combo Route) route daily.   empagliflozin  (JARDIANCE ) 10 MG TABS tablet Take 1 tablet (10 mg total) by mouth daily.   furosemide  (LASIX ) 20 MG tablet Take 1 tablet by mouth daily.   glucose blood (ACCU-CHEK AVIVA PLUS) test strip Check blood glucose twice daily and as needed. Dx E 11.65   hydrochlorothiazide (MICROZIDE) 12.5 MG capsule Take 12.5 mg by mouth daily.   insulin  glargine (LANTUS  SOLOSTAR) 100 UNIT/ML Solostar Pen  Inject 10 Units into the skin daily.   Lancets Misc. (ACCU-CHEK FASTCLIX LANCET) KIT Check blood glucose twice daily. Dx E 11.65   losartan  (COZAAR ) 25 MG tablet Take 1 tablet (25 mg total) by mouth daily. For hypertension.   montelukast  (SINGULAIR ) 10 MG tablet Take 1 tablet by mouth daily.   ondansetron  (ZOFRAN ) 4 MG tablet Take 1 tablet (4 mg total) by mouth every 4 (four) hours as needed for nausea.   pantoprazole  (PROTONIX ) 40 MG tablet Take 1 tablet  (40 mg total) by mouth 2 (two) times daily.   pioglitazone (ACTOS) 30 MG tablet Take 30 mg by mouth daily.   prochlorperazine  (COMPAZINE ) 10 MG tablet Take 10 mg by mouth every 6 (six) hours as needed.   Semaglutide  (RYBELSUS ) 3 MG TABS Take 1 tablet (3 mg total) by mouth daily. Will increase to 7 mg after 1 month.   [DISCONTINUED] albuterol  (VENTOLIN  HFA) 108 (90 Base) MCG/ACT inhaler Inhale 1-2 puffs into the lungs every 6 (six) hours as needed.   [DISCONTINUED] atorvastatin  (LIPITOR) 40 MG tablet Take 40 mg by mouth daily.   [DISCONTINUED] budesonide-formoterol (SYMBICORT) 160-4.5 MCG/ACT inhaler Inhale into the lungs.   [DISCONTINUED] celecoxib (CELEBREX) 200 MG capsule Take 200 mg by mouth 2 (two) times daily as needed.   [DISCONTINUED] gabapentin  (NEURONTIN ) 400 MG capsule Take 400 mg by mouth 3 (three) times daily.   [DISCONTINUED] levocetirizine (XYZAL) 5 MG tablet Take 5 mg by mouth daily.   [DISCONTINUED] levothyroxine  (SYNTHROID ) 125 MCG tablet Take 125 mcg by mouth daily.   [DISCONTINUED] lisinopril (ZESTRIL) 5 MG tablet Take 5 mg by mouth daily.   [DISCONTINUED] metFORMIN  (GLUCOPHAGE -XR) 500 MG 24 hr tablet Take 2 tablets by mouth daily with breakfast.   [DISCONTINUED] NEOMYCIN -POLYMYXIN-HYDROCORTISONE (CORTISPORIN) 1 % SOLN OTIC solution INSTILL 3 DROPS TO RIGHT EAR FOUR TIMES DAILY   [DISCONTINUED] OZEMPIC, 1 MG/DOSE, 4 MG/3ML SOPN Inject 1 mg into the skin once a week.   albuterol  (VENTOLIN  HFA) 108 (90 Base) MCG/ACT inhaler Inhale 1-2 puffs into the lungs every 6 (six) hours as needed.   atorvastatin  (LIPITOR) 40 MG tablet Take 1 tablet (40 mg total) by mouth daily. For cholesterol.   gabapentin  (NEURONTIN ) 400 MG capsule Take 1 capsule (400 mg total) by mouth 3 (three) times daily.   levothyroxine  (SYNTHROID ) 125 MCG tablet Take 1 tablet (125 mcg total) by mouth daily before breakfast.   metFORMIN  (GLUCOPHAGE -XR) 500 MG 24 hr tablet Week 1: Take 1 tablet by mouth (500 mg) with  breakfast daily. Week 2: Take 2 tablets by mouth (1000 mg) with breakfast daily. Week 3: Take 2 tablets by mouth (1000 mg) with breakfast daily and take 1 tablet by mouth (500 mg) with dinner daily. Week 4: Take 2 tablets by mouth (1000 mg) with breakfast daily and take 2 tablet by mouth (1000 mg) with dinner daily.   Facility-Administered Encounter Medications as of 02/04/2024  Medication   triamcinolone  acetonide (KENALOG ) 10 MG/ML injection 10 mg   triamcinolone  acetonide (KENALOG -40) injection 20 mg    Past Medical History:  Diagnosis Date   Allergy    Arthritis    Asthma    Cancer (HCC) 04/2020   colon   Diabetes mellitus without complication (HCC)    Dyspnea    walking,   Family history of adverse reaction to anesthesia    daughter PONV   GERD (gastroesophageal reflux disease)    Hypertension    Hypothyroidism    Neuromuscular disorder (HCC)  Thyroid disease     Past Surgical History:  Procedure Laterality Date   ABDOMINAL HYSTERECTOMY     due to fibroids   CHOLECYSTECTOMY     HYSTERECTOMY ABDOMINAL WITH SALPINGO-OOPHORECTOMY     POLYPECTOMY     PORTA CATH INSERTION     TUBAL LIGATION     XI ROBOTIC ASSISTED LOWER ANTERIOR RESECTION N/A 08/22/2020   Procedure: XI ROBOTIC ABDOMINAL PERIITONEAL RESECTION;  Surgeon: Debby Hila, MD;  Location: [facility];  Service: General;  Laterality: N/A;  4 HOURS    Family History  Problem Relation Age of Onset   Diabetes Mother    Hypertension Mother    Heart disease Mother    Heart attack Mother    Diabetes Father    Hypertension Father    Diabetes Sister    COPD Sister    Healthy Sister    Healthy Brother    Healthy Brother    Varicose Veins Daughter    Healthy Daughter    Healthy Son     Social History   Socioeconomic History   Marital status: Widowed    Spouse name: Not on file   Number of children: 3   Years of education: 10   Highest education level: 10th grade  Occupational History   Occupation:  UNEMPLOYED    Comment: [employer]  Tobacco Use   Smoking status: Never    Passive exposure: Never   Smokeless tobacco: Never  Vaping Use   Vaping status: Never Used  Substance and Sexual Activity   Alcohol use: Never   Drug use: Never   Sexual activity: Not Currently  Other Topics Concern   Not on file  Social History Narrative   Not on file   Social Drivers of Health   Financial Resource Strain: Medium Risk (02/02/2024)   Overall Financial Resource Strain (CARDIA)    Difficulty of Paying Living Expenses: Somewhat hard  Food Insecurity: Food Insecurity Present (02/02/2024)   Hunger Vital Sign    Worried About Running Out of Food in the Last Year: Sometimes true    Ran Out of Food in the Last Year: Sometimes true  Transportation Needs: No Transportation Needs (02/02/2024)   PRAPARE - Administrator, Civil Service (Medical): No    Lack of Transportation (Non-Medical): No  Physical Activity: Insufficiently Active (02/02/2024)   Exercise Vital Sign    Days of Exercise per Week: 1 day    Minutes of Exercise per Session: 10 min  Stress: No Stress Concern Present (02/02/2024)   [employer] of Occupational Health - Occupational Stress Questionnaire    Feeling of Stress: Not at all  Social Connections: Moderately Integrated (02/02/2024)   Social Connection and Isolation Panel    Frequency of Communication with Friends and Family: Twice a week    Frequency of Social Gatherings with Friends and Family: Once a week    Attends Religious Services: More than 4 times per year    Active Member of [employer] or Organizations: Yes    Attends Banker Meetings: More than 4 times per year    Marital Status: Widowed  Intimate Partner Violence: Not At Risk (02/04/2024)   Humiliation, Afraid, Rape, and Kick questionnaire    Fear of Current or Ex-Partner: No    Emotionally Abused: No    Physically Abused: No    Sexually Abused: No    ROS  Per HPI       Objective    BP (!) 144/76  Pulse 78   Temp 98.3 F (36.8 C) (Oral)   Resp 16   Ht 5' 2.4 (1.585 m)   Wt 183 lb (83 kg)   LMP  (LMP Unknown)   SpO2 96%   BMI 33.04 kg/m   Physical Exam Constitutional:      General: She is not in acute distress.    Appearance: Normal appearance. She is not ill-appearing.  HENT:     Head: Normocephalic and atraumatic.     Nose: Nose normal.  Eyes:     General: No scleral icterus.    Conjunctiva/sclera: Conjunctivae normal.  Cardiovascular:     Rate and Rhythm: Normal rate and regular rhythm.     Heart sounds: Normal heart sounds. No murmur heard.    No friction rub.  Pulmonary:     Effort: Pulmonary effort is normal. No respiratory distress.     Breath sounds: Normal breath sounds. No wheezing, rhonchi or rales.  Musculoskeletal:        General: Normal range of motion.  Skin:    General: Skin is warm and dry.     Coloration: Skin is not jaundiced or pale.  Neurological:     General: No focal deficit present.     Mental Status: She is alert.  Psychiatric:        Mood and Affect: Mood normal.        Behavior: Behavior normal.    Last CBC Lab Results  Component Value Date   WBC 4.3 01/27/2024   HGB 14.1 01/27/2024   HCT 40.8 01/27/2024   MCV 84.3 01/27/2024   MCH 29.1 01/27/2024   RDW 12.7 01/27/2024   PLT 223 01/27/2024   Last metabolic panel Lab Results  Component Value Date   GLUCOSE 316 (H) 01/27/2024   NA 134 (L) 01/27/2024   K 4.2 01/27/2024   CL 98 01/27/2024   CO2 25 01/27/2024   BUN 12 01/27/2024   CREATININE 0.74 01/27/2024   GFRNONAA >60 01/27/2024   CALCIUM  9.2 01/27/2024   PROT 7.7 01/27/2024   ALBUMIN 4.0 01/27/2024   BILITOT 0.4 01/27/2024   ALKPHOS 60 01/27/2024   AST 25 01/27/2024   ALT 19 01/27/2024   ANIONGAP 12 01/27/2024   Last lipids Lab Results  Component Value Date   CHOL 166 10/06/2006   HDL 40 10/06/2006   LDLCALC 94 10/06/2006   TRIG 159 10/06/2006   Last hemoglobin  A1c Lab Results  Component Value Date   HGBA1C 13.6 (A) 02/04/2024   HGBA1C 13.6 02/04/2024        Assessment & Plan:   Assessment and Plan    Establishment of Care  Type 2 diabetes mellitus with severe hyperglycemia and 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 Neuropathy likely exacerbated by mix of high blood sugar levels/chemo/vitamin issues. Previous treatment with Ozempic was not well tolerated. Treatment today should help get her to goal quickly with the option of decreasing insulin  over time if necessary. - Restart metformin  with titration - Discontinue Actos and ozempic - Start Jardiance  10 mg daily - Start Rybelsus  3 mg daily - Start insulin  at 10 units daily, administer in the abdomen with instructions of use. Will not titrate for now since we are making many changes. - Instruct on monitoring fasting glucose daily and call if below 70 - Plan for continuous glucose monitor next month - Order B12 test due to neuropathy and prior metformin  use - Schedule follow-up in 4  weeks to reassess diabetes management  Essential hypertension Blood pressure is not optimal but not severely elevated. Previous treatment with lisinopril and hydrochlorothiazide.  - Start losartan  for blood pressure management - Monitor blood pressure - Assess labs  Hypothyroidism Recent lapse in Synthroid  use. Synthroid  absorption can be affected by food and other medications. Need to re-establish stable thyroid function. - Restart Synthroid  at previous dose - Instruct to take Synthroid  30 minutes before food or other medications with water - Recheck thyroid function in 6 weeks - Assess thyroid panel  Hyperlipidemia - Refill lipitor 40mg  - Check lipid panel  Asthma Asthma is well controlled with occasional use of inhaler. Montelukast  not effective for her. - Continue inhaler as needed  Malignant neoplasm of rectum, in  remission Rectal cancer in remission for almost three years. Recent colonoscopy showed no recurrence. - Continue routine surveillance with colonoscopy every three years      Return in about 4 weeks (around 03/03/2024) for DM.   Ellwood Steidle T Rebeccah Ivins, PA-C

## 2024-02-04 NOTE — Patient Instructions (Addendum)
 It was nice to see you today!  Please message us  and let us  know which glucose meter you have at home.  Metformin  Titration Schedule: Week 1: Take 1 tablet by mouth (500 mg) with breakfast daily. Week 2: Take 2 tablets by mouth (1000 mg) with breakfast daily. Week 3: Take 2 tablets by mouth (1000 mg) with breakfast daily and take 1 tablet by mouth (500 mg) with dinner daily. Week 4: Take 2 tablets by mouth (1000 mg) with breakfast daily and take 2 tablet by mouth (1000 mg) with dinner daily.  Instructions for insulin : Inject 10 Units into the skin every morning. Do not increase your insulin  dose again if you develop any low blood sugar during the day (<70 mg/dL).    Call if blood sugar is less than 70 or consistently above 250  Take a 15 gm snack of carbohydrate at bedtime before you go to sleep if your blood sugar is less than 100.  If you are going to fast after midnight for a test or procedure, ask your physician for instructions on how to reduce/decrease your insulin  dose.  If your blood sugar is low during the day here is some info to help:  If you feel your sugar is low, test your sugar to be sure  If your sugar is low (less than 70), then take 15 grams of a fast acting Carbohydrate (3-4 glucose tablets or glucose gel or 4 ounces of juice or regular soda)  Recheck your sugar 15 min after treating with sugar to make sure it is more than 70  If sugar is still less than 70, treat again with 15 grams of carbohydrate  Don't drive the hour of having hypoglycemia (low blood sugar)  If unconscious/unable to eat or drink by mouth, family should know to call 911.  If you have any problems before your next visit feel free to message me via MyChart (minor issues or questions) or call the office, otherwise you may reach out to schedule an office visit.  Thank you! Ruben Mahler, PA-C

## 2024-02-05 ENCOUNTER — Telehealth (HOSPITAL_BASED_OUTPATIENT_CLINIC_OR_DEPARTMENT_OTHER): Payer: Self-pay

## 2024-02-05 ENCOUNTER — Telehealth (HOSPITAL_BASED_OUTPATIENT_CLINIC_OR_DEPARTMENT_OTHER): Payer: Self-pay | Admitting: Student

## 2024-02-05 LAB — COMPREHENSIVE METABOLIC PANEL WITH GFR
ALT: 16 IU/L (ref 0–32)
AST: 17 IU/L (ref 0–40)
Albumin: 4.5 g/dL (ref 3.9–4.9)
Alkaline Phosphatase: 64 IU/L (ref 49–135)
BUN/Creatinine Ratio: 17 (ref 12–28)
BUN: 13 mg/dL (ref 8–27)
Bilirubin Total: 0.4 mg/dL (ref 0.0–1.2)
CO2: 20 mmol/L (ref 20–29)
Calcium: 9.8 mg/dL (ref 8.7–10.3)
Chloride: 95 mmol/L — ABNORMAL LOW (ref 96–106)
Creatinine, Ser: 0.78 mg/dL (ref 0.57–1.00)
Globulin, Total: 3.4 g/dL (ref 1.5–4.5)
Glucose: 292 mg/dL — ABNORMAL HIGH (ref 70–99)
Potassium: 4.4 mmol/L (ref 3.5–5.2)
Sodium: 135 mmol/L (ref 134–144)
Total Protein: 7.9 g/dL (ref 6.0–8.5)
eGFR: 85 mL/min/1.73 (ref >59)

## 2024-02-05 LAB — CBC WITH DIFFERENTIAL/PLATELET
Basophils Absolute: 0.1 x10E3/uL (ref 0.0–0.2)
Basos: 1 %
EOS (ABSOLUTE): 0.1 x10E3/uL (ref 0.0–0.4)
Eos: 3 %
Hematocrit: 44.6 % (ref 34.0–46.6)
Hemoglobin: 14.7 g/dL (ref 11.1–15.9)
Immature Grans (Abs): 0.1 x10E3/uL (ref 0.0–0.1)
Immature Granulocytes: 1 %
Lymphocytes Absolute: 1 x10E3/uL (ref 0.7–3.1)
Lymphs: 22 %
MCH: 29.1 pg (ref 26.6–33.0)
MCHC: 33 g/dL (ref 31.5–35.7)
MCV: 88 fL (ref 79–97)
Monocytes Absolute: 0.3 x10E3/uL (ref 0.1–0.9)
Monocytes: 8 %
Neutrophils Absolute: 2.9 x10E3/uL (ref 1.4–7.0)
Neutrophils: 65 %
Platelets: 243 x10E3/uL (ref 150–450)
RBC: 5.05 x10E6/uL (ref 3.77–5.28)
RDW: 13.1 % (ref 11.7–15.4)
WBC: 4.5 x10E3/uL (ref 3.4–10.8)

## 2024-02-05 LAB — LIPID PANEL
Chol/HDL Ratio: 4.9 ratio — ABNORMAL HIGH (ref 0.0–4.4)
Cholesterol, Total: 190 mg/dL (ref 100–199)
HDL: 39 mg/dL — ABNORMAL LOW (ref >39)
LDL Chol Calc (NIH): 117 mg/dL — ABNORMAL HIGH (ref 0–99)
Triglycerides: 192 mg/dL — ABNORMAL HIGH (ref 0–149)
VLDL Cholesterol Cal: 34 mg/dL (ref 5–40)

## 2024-02-05 LAB — TSH+FREE T4
Free T4: 0.75 ng/dL — ABNORMAL LOW (ref 0.82–1.77)
TSH: 5.07 u[IU]/mL — ABNORMAL HIGH (ref 0.450–4.500)

## 2024-02-05 LAB — B12 AND FOLATE PANEL
Folate: 6.9 ng/mL (ref >3.0)
Vitamin B-12: 466 pg/mL (ref 232–1245)

## 2024-02-05 LAB — MICROALBUMIN / CREATININE URINE RATIO
Creatinine, Urine: 47 mg/dL
Microalb/Creat Ratio: 24 mg/g{creat} (ref 0–29)
Microalbumin, Urine: 11.4 ug/mL

## 2024-02-05 NOTE — Telephone Encounter (Unsigned)
 Copied from CRM #[id number]. Topic: Clinical - Medication Refill >> Feb 05, 2024  1:19 PM Amy B wrote: Medication:  Accu-Chek Softclix Lancets lancets. Patient states she has the pens but does not have the lancets.  Has the patient contacted their pharmacy? Yes (Agent: If no, request that the patient contact the pharmacy for the refill. If patient does not wish to contact the pharmacy document the reason why and proceed with request.) (Agent: If yes, when and what did the pharmacy advise?)  This is the patient's preferred pharmacy:  [facility] [id number] - [place], [place] - [address] AT [facility] [address] [facility] [address] [place] [address] Phone: [phone] Fax: [phone]  Is this the correct pharmacy for this prescription? Yes If no, delete pharmacy and type the correct one.   Has the prescription been filled recently? No  Is the patient out of the medication? Yes  Has the patient been seen for an appointment in the last year OR does the patient have an upcoming appointment? Yes  Can we respond through MyChart? Yes  Agent: Please be advised that Rx refills may take up to 3 business days. We ask that you follow-up with your pharmacy.

## 2024-02-05 NOTE — Telephone Encounter (Signed)
 Rybelsus  denied. Per covermymeds:    Per the health plan preferred drug list, at least 2 preferred drugs must be tried before requesting this drug  or tell us  why the member cannot try any preferred alternatives. Please send us  supporting chart notes and  lab results. Here is a list of preferred alternatives: Byetta Pen, Trulicity Pen, Victoza Pen, Ozempic Pen.  Per our records, the member has already tried Ozempic Pen.

## 2024-02-05 NOTE — Telephone Encounter (Signed)
 Pt advised to pick up jardiance  (PA approved). Rybelsus  PA is pending. Pt advised to please pick up meds ASAP if they both get approved. Approved. This drug has been approved. Approved quantity: 90 units per 90 day(s). The drug has been approved from 01/22/2024 to 02/04/2025.

## 2024-02-08 ENCOUNTER — Encounter: Payer: Self-pay | Admitting: Hematology and Oncology

## 2024-02-08 ENCOUNTER — Ambulatory Visit (HOSPITAL_BASED_OUTPATIENT_CLINIC_OR_DEPARTMENT_OTHER): Payer: Self-pay | Admitting: Student

## 2024-02-08 NOTE — Telephone Encounter (Signed)
 Pt said she is willing to do trulicity. Ozempic is the only injectable she tried. She needs an rx for needs for the lantus .

## 2024-02-09 ENCOUNTER — Encounter (HOSPITAL_BASED_OUTPATIENT_CLINIC_OR_DEPARTMENT_OTHER): Payer: Self-pay

## 2024-02-10 ENCOUNTER — Encounter (HOSPITAL_BASED_OUTPATIENT_CLINIC_OR_DEPARTMENT_OTHER): Payer: Self-pay

## 2024-02-10 ENCOUNTER — Other Ambulatory Visit (HOSPITAL_BASED_OUTPATIENT_CLINIC_OR_DEPARTMENT_OTHER): Payer: Self-pay | Admitting: Student

## 2024-02-10 DIAGNOSIS — E1142 Type 2 diabetes mellitus with diabetic polyneuropathy: Secondary | ICD-10-CM

## 2024-02-10 MED ORDER — TRUE COMFORT PEN NEEDLES 32G X 4 MM MISC: 99 refills | Status: AC

## 2024-02-10 MED ORDER — TRULICITY 0.75 MG/0.5ML ~~LOC~~ SOAJ: 0.7500 mg | SUBCUTANEOUS | 0 refills | Status: DC

## 2024-03-03 ENCOUNTER — Encounter (HOSPITAL_BASED_OUTPATIENT_CLINIC_OR_DEPARTMENT_OTHER): Payer: Self-pay | Admitting: Student

## 2024-03-03 ENCOUNTER — Ambulatory Visit (INDEPENDENT_AMBULATORY_CARE_PROVIDER_SITE_OTHER): Admitting: Student

## 2024-03-03 VITALS — BP 121/74 | HR 72 | Temp 98.3°F | Resp 16 | Ht 62.4 in | Wt 184.2 lb

## 2024-03-03 DIAGNOSIS — E1142 Type 2 diabetes mellitus with diabetic polyneuropathy: Secondary | ICD-10-CM | POA: Insufficient documentation

## 2024-03-03 DIAGNOSIS — C20 Malignant neoplasm of rectum: Secondary | ICD-10-CM | POA: Diagnosis not present

## 2024-03-03 DIAGNOSIS — E1165 Type 2 diabetes mellitus with hyperglycemia: Secondary | ICD-10-CM | POA: Insufficient documentation

## 2024-03-03 DIAGNOSIS — I10 Essential (primary) hypertension: Secondary | ICD-10-CM

## 2024-03-03 DIAGNOSIS — E782 Mixed hyperlipidemia: Secondary | ICD-10-CM

## 2024-03-03 DIAGNOSIS — E039 Hypothyroidism, unspecified: Secondary | ICD-10-CM

## 2024-03-03 MED ORDER — DULAGLUTIDE 1.5 MG/0.5ML ~~LOC~~ SOAJ: 1.5000 mg | SUBCUTANEOUS | 0 refills | Status: DC

## 2024-03-03 MED ORDER — FREESTYLE LIBRE 3 READER DEVI: 1.0000 | 0 refills | Status: DC | PRN

## 2024-03-03 MED ORDER — DULOXETINE HCL 30 MG PO CPEP
30.0000 mg | ORAL_CAPSULE | Freq: Every day | ORAL | 3 refills | Status: AC
Start: 2024-03-03 — End: ?

## 2024-03-03 MED ORDER — FREESTYLE LIBRE 3 PLUS SENSOR MISC: 6 refills | Status: DC

## 2024-03-03 NOTE — Patient Instructions (Addendum)
 It was nice to see you today!  As we discussed in clinic:  Please schedule an eye exam.   If you have any problems before your next visit feel free to message me via MyChart (minor issues or questions) or call the office, otherwise you may reach out to schedule an office visit.  Thank you! Jamillia Closson, PA-C P

## 2024-03-03 NOTE — Progress Notes (Signed)
 Established Patient Office Visit  Subjective   Patient ID: Tanya Mcclure, female    DOB: 1960-12-19  Age: 63 y.o. MRN: [id number]  Chief Complaint  Patient presents with   Medical Management of Chronic Issues    Follow up. [facility] in [address], [place], [place] told her shingrix was a 1 time shot. Never got 2nd one.    HPI  Discussed the use of AI scribe software for clinical note transcription with the patient, who gave verbal consent to proceed.  History of Present Illness   Tanya Mcclure is a 63 year old female with type 2 diabetes mellitus who presents for follow-up of her blood sugar management. She is accompanied by her grandson.  Her fasting blood sugar this morning was 160 mg/dL, with recent readings around 140 mg/dL. She has been making dietary changes, such as reducing bread and soda intake, to help manage her blood sugar levels. She has not received her prescription for Jardiance  from [facility]. She is currently on 10 units of insulin  daily and takes Trulicity, experiencing mild nausea in the mornings, particularly on Tuesdays and Wednesdays after her Monday dose. She rotates injection sites but notes some bruising. She continues to take metformin  at 1000 mg twice daily and has not started pioglitazone.  She is on losartan  for blood pressure management and reports no issues with faintness or lightheadedness. Her thyroid medication is taken before breakfast, and she feels well on it. She is not taking hydrochlorothiazide and only takes losartan  for blood pressure.  She experiences neuropathy pain in her feet and legs, described as tingling and sensitivity, and is currently on gabapentin  400 mg three times daily, which she feels is not helping much.  She denies muscle aches or joint pains on her cholesterol medication and has not experienced faintness or lightheadedness with her blood pressure medication.       Patient Active Problem List   Diagnosis Date Noted   Uncontrolled  type 2 diabetes mellitus with hyperglycemia (HCC) 03/03/2024   Nausea without vomiting 06/26/2022   Neuropathy due to chemotherapeutic drug 06/26/2021    Class: Chronic   Hypokalemia 06/29/2020   Rectal cancer (HCC) 04/23/2020    Class: Diagnosis of   Mixed dyslipidemia 05/11/2019   Mild intermittent asthma with acute exacerbation 05/04/2018   Type 2 diabetes mellitus with neurologic complication, without long-term current use of insulin  (HCC) 04/19/2018   Osteoarthritis of right knee 06/30/2017   Vitamin D deficiency 06/22/2017   UTI 01/28/2008   MENORRHAGIA, PERIMENOPAUSAL 01/28/2008   GASTROENTERITIS, VIRAL 10/12/2007   VAGINITIS, CANDIDAL 07/05/2007   URI 07/05/2007   POLYURIA 07/05/2007   PYELONEPHRITIS, ACUTE 03/03/2007   HYPOTHYROIDISM 12/29/2006   OBESITY NOS 12/29/2006   DEPRESSION 12/29/2006   HYPERTENSION 12/29/2006   ALLERGIC RHINITIS, SEASONAL 12/29/2006   BRONCHITIS 12/29/2006   ASTHMA 12/29/2006   ENDOMETRIOSIS 12/29/2006   BREAST MASS, LEFT 11/03/2005   Past Medical History:  Diagnosis Date   Allergy    Arthritis    Asthma    Cancer (HCC) 04/2020   colon   Diabetes mellitus without complication (HCC)    Dyspnea    walking,   Family history of adverse reaction to anesthesia    daughter PONV   GERD (gastroesophageal reflux disease)    Hypertension    Hypothyroidism    Neuromuscular disorder (HCC)    Thyroid disease    Social History   Tobacco Use   Smoking status: Never    Passive exposure: Never  Smokeless tobacco: Never  Vaping Use   Vaping status: Never Used  Substance Use Topics   Alcohol use: Never   Drug use: Never   Allergies  Allergen Reactions   Penicillins Rash      ROS Per HPI.    Objective:     BP 121/74   Pulse 72   Temp 98.3 F (36.8 C) (Oral)   Resp 16   Ht 5' 2.4 (1.585 m)   Wt 184 lb 3.2 oz (83.6 kg)   LMP  (LMP Unknown)   SpO2 96%   BMI 33.26 kg/m  BP Readings from Last 3 Encounters:  03/03/24 121/74   02/04/24 (!) 146/83  01/27/24 135/66   Wt Readings from Last 3 Encounters:  03/03/24 184 lb 3.2 oz (83.6 kg)  02/04/24 183 lb (83 kg)  01/27/24 183 lb 3.2 oz (83.1 kg)      Physical Exam Constitutional:      General: She is not in acute distress.    Appearance: Normal appearance. She is not ill-appearing.  HENT:     Head: Normocephalic and atraumatic.     Nose: Nose normal.  Eyes:     General: No scleral icterus.    Conjunctiva/sclera: Conjunctivae normal.  Cardiovascular:     Rate and Rhythm: Normal rate and regular rhythm.     Heart sounds: Normal heart sounds. No murmur heard.    No friction rub.  Pulmonary:     Effort: Pulmonary effort is normal. No respiratory distress.     Breath sounds: Normal breath sounds. No wheezing, rhonchi or rales.  Musculoskeletal:        General: Normal range of motion.  Skin:    General: Skin is warm and dry.     Coloration: Skin is not jaundiced or pale.  Neurological:     General: No focal deficit present.     Mental Status: She is alert.  Psychiatric:        Mood and Affect: Mood normal.        Behavior: Behavior normal.      No results found for any visits on 03/03/24.  Last CBC Lab Results  Component Value Date   WBC 4.5 02/04/2024   HGB 14.7 02/04/2024   HCT 44.6 02/04/2024   MCV 88 02/04/2024   MCH 29.1 02/04/2024   RDW 13.1 02/04/2024   PLT 243 02/04/2024   Last metabolic panel Lab Results  Component Value Date   GLUCOSE 292 (H) 02/04/2024   NA 135 02/04/2024   K 4.4 02/04/2024   CL 95 (L) 02/04/2024   CO2 20 02/04/2024   BUN 13 02/04/2024   CREATININE 0.78 02/04/2024   EGFR 85 02/04/2024   CALCIUM  9.8 02/04/2024   PROT 7.9 02/04/2024   ALBUMIN 4.5 02/04/2024   LABGLOB 3.4 02/04/2024   BILITOT 0.4 02/04/2024   ALKPHOS 64 02/04/2024   AST 17 02/04/2024   ALT 16 02/04/2024   ANIONGAP 12 01/27/2024   Last lipids Lab Results  Component Value Date   CHOL 190 02/04/2024   HDL 39 (L) 02/04/2024    LDLCALC 117 (H) 02/04/2024   TRIG 192 (H) 02/04/2024   CHOLHDL 4.9 (H) 02/04/2024   Last hemoglobin A1c Lab Results  Component Value Date   HGBA1C 13.6 (A) 02/04/2024   HGBA1C 13.6 02/04/2024   Last thyroid functions Lab Results  Component Value Date   TSH 5.070 (H) 02/04/2024   FREET4 0.75 (L) 02/04/2024      The 10-year ASCVD  risk score (Arnett DK, et al., 2019) is: 12.9%    Assessment & Plan:   Assessment and Plan    Type 2 diabetes mellitus with diabetic neuropathy Chronic, improved but not at goal. Blood glucose levels have improved with current management, fasting glucose around 160 mg/dL. Previous A1c indicated higher glucose levels. Experiencing mild nausea, likely related to Trulicity, particularly post-administration. Neuropathy pain in feet and legs is significant, with tingling and sensitivity. Gabapentin  at current dose is inadequate for relief. - Continue metformin  1000 mg twice daily. - Continue insulin  at 10 units daily. - Increase Trulicity to 1.5 mg weekly after current doses are completed. - Call in prescription for Jardiance  and verify with pharmacy. [facility] for continuous glucose monitoring. - Prescribe Cymbalta 30 mg daily for neuropathic pain, pending prior authorization. - Continue gabapentin  400 mg three times daily. - Schedule an eye exam.  Hypertension Chronic, stable. Blood pressure is well-controlled with current medication regimen. No symptoms of lightheadedness or fainting reported. - Continue losartan  as currently prescribed. - Discontinue hydrochlorothiazide.  Hypothyroidism Chronic, stable. Currently well-managed on thyroid medication. No symptoms reported. - Continue current thyroid medication regimen. - Recheck thyroid function at next appointment.  Hyperlipidemia Chronic, stable. No muscle aches or joint pains reported with current cholesterol medication.     Rectal cancer - Continue to follow with heme-onc. -  Did not note any current signs or symptoms  Return in about 1 month (around 04/03/2024) for DM.    Zarif Rathje T Dessie Tatem, PA-C

## 2024-03-07 ENCOUNTER — Ambulatory Visit (INDEPENDENT_AMBULATORY_CARE_PROVIDER_SITE_OTHER)
Admission: RE | Admit: 2024-03-07 | Discharge: 2024-03-07 | Disposition: A | Discharge status: Home / Self Care | Source: Ambulatory Visit | Attending: Oncology | Admitting: Oncology

## 2024-03-07 ENCOUNTER — Encounter (HOSPITAL_BASED_OUTPATIENT_CLINIC_OR_DEPARTMENT_OTHER): Payer: Self-pay | Admitting: Radiology

## 2024-03-07 DIAGNOSIS — Z1231 Encounter for screening mammogram for malignant neoplasm of breast: Secondary | ICD-10-CM

## 2024-03-07 DIAGNOSIS — Z1239 Encounter for other screening for malignant neoplasm of breast: Secondary | ICD-10-CM

## 2024-04-04 ENCOUNTER — Encounter (HOSPITAL_BASED_OUTPATIENT_CLINIC_OR_DEPARTMENT_OTHER): Payer: Self-pay | Admitting: Student

## 2024-04-04 ENCOUNTER — Ambulatory Visit (INDEPENDENT_AMBULATORY_CARE_PROVIDER_SITE_OTHER): Admitting: Student

## 2024-04-04 VITALS — BP 135/75 | HR 81 | Temp 98.5°F | Resp 16 | Ht 62.4 in | Wt 182.7 lb

## 2024-04-04 DIAGNOSIS — J069 Acute upper respiratory infection, unspecified: Secondary | ICD-10-CM | POA: Diagnosis not present

## 2024-04-04 DIAGNOSIS — E1165 Type 2 diabetes mellitus with hyperglycemia: Secondary | ICD-10-CM | POA: Diagnosis not present

## 2024-04-04 DIAGNOSIS — T451X5A Adverse effect of antineoplastic and immunosuppressive drugs, initial encounter: Secondary | ICD-10-CM | POA: Diagnosis not present

## 2024-04-04 DIAGNOSIS — G620 Drug-induced polyneuropathy: Secondary | ICD-10-CM

## 2024-04-04 LAB — POC SOFIA 2 FLU + SARS ANTIGEN FIA
Influenza A, POC: NEGATIVE
Influenza B, POC: NEGATIVE
SARS Coronavirus 2 Ag: NEGATIVE

## 2024-04-04 MED ORDER — TRULICITY 3 MG/0.5ML ~~LOC~~ SOAJ: 3.0000 mg | SUBCUTANEOUS | 6 refills | Status: AC

## 2024-04-04 NOTE — Patient Instructions (Addendum)
 It was nice to see you today!  - Please make sure you get your eyes check (mid-march) - Please get your shingles vaccine when you are feeling better - Please get your pneumonia vaccine whenever you are feeling better  If you have any problems before your next visit feel free to message me via MyChart (minor issues or questions) or call the office, otherwise you may reach out to schedule an office visit.  Thank you! Anita Mcadory, PA-C

## 2024-04-04 NOTE — Progress Notes (Signed)
 Established Patient Office Visit  Subjective   Patient ID: Tanya Mcclure, female    DOB: 1961/02/09  Age: 63 y.o. MRN: [id number]  Chief Complaint  Patient presents with   URI    Sick since Friday. Threw up once. Still congested and has a bad cough. Wheezing a little bit. Took mucinex DM, mucinex cough drops, and theraflu. No sore throat right now.    Medical Management of Chronic Issues    Follow up.    HPI  Discussed the use of AI scribe software for clinical note transcription with the patient, who gave verbal consent to proceed.  History of Present Illness   Tanya Mcclure is a 63 year old female with diabetes who presents for diabetic follow up (Trulicity  titration) with persistent cough and congestion.  She has been experiencing a persistent cough and nasal congestion since last Friday. The cough is particularly bothersome at night and in the early part of the day, and it is difficult to control. She has been using Mucinex DM, Theraflu, and Mucinex cough drops to manage her symptoms. Additionally, she uses a regular nasal spray from [facility] to alleviate congestion, though she is cautious about overuse due to past dependency. She vomited dark brown material a couple of times on the first night of her illness but has not vomited since. Occasional nausea is present, for which she takes nausea medication.  She has a history of diabetes and is currently on metformin  1000 mg twice daily, insulin  10 units daily, and Trulicity  1.5 mg weekly. Her blood sugar levels have been ranging between 140 and 150 mg/dL, with a recent spike to 200 mg/dL, which she attributes to illness and dietary choices. She is not currently using a Freestyle Libre due to cost concerns and has not started Jardiance  as the prescription has not been received.  She is also on Cymbalta  for depression and neuropathy pain, which she reports is providing some improvement. She recently refilled her prescription and is in the  early stages of treatment.  She had a hysterectomy in the past due to fibroids, and she is unsure if her cervix was removed during the procedure. She is scheduled for an eye exam in mid-March and is on a waiting list for an earlier appointment. She has not received a pneumonia vaccine and is due for a second shingles vaccine. No changes in hair, skin, or nails.      Cervix  Foot exam normal with dry cracked left heel  Patient Active Problem List   Diagnosis Date Noted   Uncontrolled type 2 diabetes mellitus with hyperglycemia (HCC) 03/03/2024   Diabetic polyneuropathy associated with type 2 diabetes mellitus (HCC) 03/03/2024   Nausea without vomiting 06/26/2022   Neuropathy due to chemotherapeutic drug 06/26/2021    Class: Chronic   Hypokalemia 06/29/2020   Rectal cancer (HCC) 04/23/2020    Class: Diagnosis of   Mixed dyslipidemia 05/11/2019   Mild intermittent asthma with acute exacerbation 05/04/2018   Type 2 diabetes mellitus with neurologic complication, without long-term current use of insulin  (HCC) 04/19/2018   Osteoarthritis of right knee 06/30/2017   Vitamin D deficiency 06/22/2017   UTI 01/28/2008   MENORRHAGIA, PERIMENOPAUSAL 01/28/2008   GASTROENTERITIS, VIRAL 10/12/2007   VAGINITIS, CANDIDAL 07/05/2007   URI 07/05/2007   POLYURIA 07/05/2007   PYELONEPHRITIS, ACUTE 03/03/2007   HYPOTHYROIDISM 12/29/2006   OBESITY NOS 12/29/2006   DEPRESSION 12/29/2006   HYPERTENSION 12/29/2006   ALLERGIC RHINITIS, SEASONAL 12/29/2006   BRONCHITIS 12/29/2006   ASTHMA  12/29/2006   ENDOMETRIOSIS 12/29/2006   BREAST MASS, LEFT 11/03/2005   Past Medical History:  Diagnosis Date   Allergy    Arthritis    Asthma    Cancer (HCC) 04/2020   colon   Diabetes mellitus without complication (HCC)    Dyspnea    walking,   Family history of adverse reaction to anesthesia    daughter PONV   GERD (gastroesophageal reflux disease)    Hypertension    Hypothyroidism    Neuromuscular  disorder (HCC)    Thyroid disease    Social History   Tobacco Use   Smoking status: Never    Passive exposure: Never   Smokeless tobacco: Never  Vaping Use   Vaping status: Never Used  Substance Use Topics   Alcohol use: Never   Drug use: Never   Allergies  Allergen Reactions   Penicillins Rash      ROS Per HPI.    Objective:     BP 135/75   Pulse 81   Temp 98.5 F (36.9 C) (Oral)   Resp 16   Ht 5' 2.4 (1.585 m)   Wt 182 lb 11.2 oz (82.9 kg)   LMP  (LMP Unknown)   SpO2 96%   BMI 32.99 kg/m  BP Readings from Last 3 Encounters:  04/04/24 135/75  03/03/24 121/74  02/04/24 (!) 146/83   Wt Readings from Last 3 Encounters:  04/04/24 182 lb 11.2 oz (82.9 kg)  03/03/24 184 lb 3.2 oz (83.6 kg)  02/04/24 183 lb (83 kg)   SpO2 Readings from Last 3 Encounters:  04/04/24 96%  03/03/24 96%  02/04/24 96%      Physical Exam Constitutional:      General: She is not in acute distress.    Appearance: Normal appearance. She is not ill-appearing.  HENT:     Head: Normocephalic and atraumatic.     Nose: Nose normal.  Eyes:     General: No scleral icterus.    Conjunctiva/sclera: Conjunctivae normal.  Cardiovascular:     Rate and Rhythm: Normal rate and regular rhythm.     Heart sounds: Normal heart sounds. No murmur heard.    No friction rub.  Pulmonary:     Effort: Pulmonary effort is normal. No respiratory distress.     Breath sounds: Normal breath sounds. No wheezing, rhonchi or rales.  Musculoskeletal:        General: Normal range of motion.     Right foot: No deformity or Charcot foot.     Left foot: No deformity or Charcot foot.  Feet:     Right foot:     Protective Sensation: 9 sites tested.  9 sites sensed.     Skin integrity: Skin integrity normal. No ulcer, skin breakdown, erythema or warmth.     Toenail Condition: Right toenails are normal.     Left foot:     Protective Sensation: 9 sites tested.  9 sites sensed.     Skin integrity: Dry skin  present. No ulcer, skin breakdown, erythema or warmth.     Toenail Condition: Left toenails are normal.     Comments: Normal pulses Skin:    General: Skin is warm and dry.     Coloration: Skin is not jaundiced or pale.  Neurological:     General: No focal deficit present.     Mental Status: She is alert.  Psychiatric:        Mood and Affect: Mood normal.  Behavior: Behavior normal.      Results for orders placed or performed in visit on 04/04/24  POC SOFIA 2 FLU + SARS ANTIGEN FIA  Result Value Ref Range   Influenza A, POC Negative Negative   Influenza B, POC Negative Negative   SARS Coronavirus 2 Ag Negative Negative    Last CBC Lab Results  Component Value Date   WBC 4.5 02/04/2024   HGB 14.7 02/04/2024   HCT 44.6 02/04/2024   MCV 88 02/04/2024   MCH 29.1 02/04/2024   RDW 13.1 02/04/2024   PLT 243 02/04/2024   Last metabolic panel Lab Results  Component Value Date   GLUCOSE 292 (H) 02/04/2024   NA 135 02/04/2024   K 4.4 02/04/2024   CL 95 (L) 02/04/2024   CO2 20 02/04/2024   BUN 13 02/04/2024   CREATININE 0.78 02/04/2024   EGFR 85 02/04/2024   CALCIUM  9.8 02/04/2024   PROT 7.9 02/04/2024   ALBUMIN 4.5 02/04/2024   LABGLOB 3.4 02/04/2024   BILITOT 0.4 02/04/2024   ALKPHOS 64 02/04/2024   AST 17 02/04/2024   ALT 16 02/04/2024   ANIONGAP 12 01/27/2024   Last lipids Lab Results  Component Value Date   CHOL 190 02/04/2024   HDL 39 (L) 02/04/2024   LDLCALC 117 (H) 02/04/2024   TRIG 192 (H) 02/04/2024   CHOLHDL 4.9 (H) 02/04/2024   Last hemoglobin A1c Lab Results  Component Value Date   HGBA1C 13.6 (A) 02/04/2024   HGBA1C 13.6 02/04/2024      The 10-year ASCVD risk score (Arnett DK, et al., 2019) is: 15.8%    Assessment & Plan:   Assessment and Plan    Acute upper respiratory infection with cough and congestion Acute upper respiratory infection with persistent cough and congestion since last Friday. Negative COVID and flu tests. Cough  is severe at night and earlier in the day, with occasional nausea. - Use nasal saline throughout the day to reduce congestion and mucus. - Limit use of nasal spray (oxymetazoline) to no more than three days to avoid dependency. - Continue using Mucinex DM and cough drops as needed. - If symptoms persist after Thanksgiving, will consider antibiotics or urgent care visit.  Type 2 diabetes mellitus with hyperglycemia and drug-induced polyneuropathy Chronic, not at goal. Type 2 diabetes with recent hyperglycemia, likely exacerbated by recent illness and dietary choices. Blood sugar levels range between 140-150 mg/dL, with a recent spike to 200 mg/dL. Currently on metformin , insulin , and Trulicity . Trulicity  dose increased to 1.5 mg weekly without adverse effects. Plan to increase Trulicity  to 3 mg weekly. A1c previously elevated at 9-11% at last A1c but suspect next will be much better. - Increased Trulicity  to 3 mg weekly after completing current 1.5 mg dose. - Will recheck A1c in one month. - Continue metformin  1000 mg twice daily and insulin  10 units daily. - Monitor blood sugar levels regularly.  Depression Managed with duloxetine  (Cymbalta ). Reports slight improvement in symptoms. Med is likely not quite at max benefit, will give until about 6 weeks.  - Continue duloxetine  as prescribed.  General Health Maintenance Routine health maintenance discussed. Needs second shingles vaccine and pneumonia vaccine. Eye exam scheduled for mid-March. Foot exam performed today. Discussed potential need for cervical cancer screening due to unclear hysterectomy history. - Will administer second shingles vaccine when feeling better. - Will administer pneumonia vaccine. - Will complete eye exam in mid-March. - Will review records to determine need for cervical cancer screening. - Use  urea cream or ammonium lactate lotion for dry, cracked feet.       Return in about 5 weeks (around 05/09/2024) for DM.     Hermila Millis T Jenene Kauffmann, PA-C

## 2024-05-04 ENCOUNTER — Other Ambulatory Visit (HOSPITAL_BASED_OUTPATIENT_CLINIC_OR_DEPARTMENT_OTHER): Payer: Self-pay | Admitting: Student

## 2024-05-04 DIAGNOSIS — E039 Hypothyroidism, unspecified: Secondary | ICD-10-CM

## 2024-05-11 ENCOUNTER — Ambulatory Visit (HOSPITAL_BASED_OUTPATIENT_CLINIC_OR_DEPARTMENT_OTHER): Admitting: Student

## 2024-05-17 DIAGNOSIS — I10 Essential (primary) hypertension: Secondary | ICD-10-CM

## 2024-05-23 ENCOUNTER — Encounter (HOSPITAL_BASED_OUTPATIENT_CLINIC_OR_DEPARTMENT_OTHER): Payer: Self-pay | Admitting: Student

## 2024-05-23 ENCOUNTER — Ambulatory Visit (INDEPENDENT_AMBULATORY_CARE_PROVIDER_SITE_OTHER): Admitting: Student

## 2024-05-23 VITALS — BP 127/76 | HR 86 | Temp 98.3°F | Resp 16 | Ht 62.4 in | Wt 183.3 lb

## 2024-05-23 DIAGNOSIS — E559 Vitamin D deficiency, unspecified: Secondary | ICD-10-CM

## 2024-05-23 DIAGNOSIS — T451X5A Adverse effect of antineoplastic and immunosuppressive drugs, initial encounter: Secondary | ICD-10-CM | POA: Diagnosis not present

## 2024-05-23 DIAGNOSIS — Z23 Encounter for immunization: Secondary | ICD-10-CM

## 2024-05-23 DIAGNOSIS — G620 Drug-induced polyneuropathy: Secondary | ICD-10-CM

## 2024-05-23 DIAGNOSIS — E1142 Type 2 diabetes mellitus with diabetic polyneuropathy: Secondary | ICD-10-CM | POA: Diagnosis not present

## 2024-05-23 DIAGNOSIS — E039 Hypothyroidism, unspecified: Secondary | ICD-10-CM

## 2024-05-23 DIAGNOSIS — Z7984 Long term (current) use of oral hypoglycemic drugs: Secondary | ICD-10-CM | POA: Diagnosis not present

## 2024-05-23 DIAGNOSIS — I10 Essential (primary) hypertension: Secondary | ICD-10-CM | POA: Diagnosis not present

## 2024-05-23 DIAGNOSIS — R110 Nausea: Secondary | ICD-10-CM | POA: Diagnosis not present

## 2024-05-23 DIAGNOSIS — Z933 Colostomy status: Secondary | ICD-10-CM | POA: Insufficient documentation

## 2024-05-23 DIAGNOSIS — E782 Mixed hyperlipidemia: Secondary | ICD-10-CM

## 2024-05-23 LAB — POCT GLYCOSYLATED HEMOGLOBIN (HGB A1C)
HbA1c POC (<> result, manual entry): 10 %
Hemoglobin A1C: 10 % — AB (ref 4.0–5.6)

## 2024-05-23 MED ORDER — ONDANSETRON HCL 4 MG PO TABS: 4.0000 mg | ORAL_TABLET | ORAL | 0 refills | Status: AC | PRN

## 2024-05-23 MED ORDER — METFORMIN HCL ER 500 MG PO TB24: 1000.0000 mg | ORAL_TABLET | Freq: Two times a day (BID) | ORAL | 3 refills | Status: AC

## 2024-05-23 NOTE — Patient Instructions (Addendum)
 It was nice to see you today!  As we discussed in clinic:  - Please call your pharmacy to fill the jardiance  prescription. It was approved. - Please make sure to take the protonix  two times daily - Begin taking b12 over the counter - 1,000 mcg daily - I will set you up for a blood pressure check with my nurse in 6 weeks  If you have any problems before your next visit feel free to message me via MyChart (minor issues or questions) or call the office, otherwise you may reach out to schedule an office visit.  Thank you! Lakynn Halvorsen, PA-C

## 2024-05-23 NOTE — Progress Notes (Signed)
 "  Established Patient Office Visit  Subjective   Patient ID: Tanya Mcclure, female    DOB: 11/14/1960  Age: 64 y.o. MRN: [id number]  Chief Complaint  Patient presents with   Medical Management of Chronic Issues    Follow up. [facility] never received jardance rx. Pt said she forgot about the jardiance  approval. Believes BS will be high. Had to eat a lot of chips due to waiting for paycheck. Had to go to the food bank. Pt states metformin  she just received was for 500mg  ER BID and she is still taking 1000mg  BID. Needs rx fixed.   Pain    Lots of pain with hip and legs. Pt states she has some knots that are bothering her here and there. She was seeing a specialist she would like to go back to but cannot recall their name. Did not start duloxetine  due to seeing it was for depression as well.     HPI  Discussed the use of AI scribe software for clinical note transcription with the patient, who gave verbal consent to proceed.  History of Present Illness   Tanya Mcclure is a 64 year old female who presents for a follow-up visit to recheck her A1c and discuss diabetes management.  She has been monitoring her blood sugar levels, which have been around 140-150 mg/dL, with a recent spike to 200 mg/dL due to poor dietary choices. She is currently taking Trulicity  3 mg, glargine 10 units daily, and metformin  2 tablets in the morning and 2 at night. She experiences occasional diarrhea from metformin  and mild nausea from Trulicity , which she tolerates better than Ozempic, which caused vomiting. She has been unable to obtain Jardiance  from the pharmacy despite it being approved.  She reports significant nausea and headaches over the past two days, impacting her daily activities. She has a history of reflux and is taking Protonix  twice daily, although she sometimes forgets the second dose. She is also using Zofran  for nausea.  She has a colostomy and reports soreness and irritation around the site, with a  small growth that may need removal if it becomes too irritating. She receives her colostomy supplies from [facility].  She was prescribed Cymbalta  for pain management and was initially hesitant to take it due to concerns about depression medication but recently picked up a prescription for Cymbalta . She was initially hesitant to take it due to concerns about depression medication but recently picked up a prescription for Cymbalta .  She has a history of low vitamin D  and B12 levels. She recently purchased a women's vitamin but has not started it yet and has not been taking a B12 supplement.  She missed her second shingles shot due to misinformation from the pharmacy, which stated it was a one-time vaccine. She received her first shot in 2024.  She has three active boys at home who keep her busy.      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    Class: Chronic   Hypokalemia 06/29/2020   Rectal cancer (HCC) 04/23/2020    Class: Diagnosis of   Mixed dyslipidemia 05/11/2019   Type 2 diabetes mellitus with neurologic complication, without long-term current use of insulin  (HCC) 04/19/2018   Osteoarthritis of  right knee 06/30/2017   Vitamin D  deficiency 06/22/2017   MENORRHAGIA, PERIMENOPAUSAL 01/28/2008   URI 07/05/2007   HYPOTHYROIDISM 12/29/2006   DEPRESSION 12/29/2006   HYPERTENSION 12/29/2006   ALLERGIC RHINITIS, SEASONAL 12/29/2006   ENDOMETRIOSIS 12/29/2006   Mild asthma without complication 12/29/2006   Past Medical History:  Diagnosis Date   Allergy    Arthritis    Asthma    Cancer (HCC) 04/2020   colon   Diabetes mellitus without complication (HCC)    Dyspnea    walking,    Family history of adverse reaction to anesthesia    daughter PONV   GERD (gastroesophageal reflux disease)    Hypertension    Hypothyroidism    Neuromuscular disorder (HCC)    Thyroid disease    Social History[1] Allergies[2]    ROS Per HPI.    Objective:     BP (!) 142/77 (BP Location: Right Arm, Patient Position: Sitting, Cuff Size: Normal)   Pulse 86   Temp 98.3 F (36.8 C) (Oral)   Resp 16   Ht 5' 2.4 (1.585 m)   Wt 183 lb 4.8 oz (83.1 kg)   LMP  (LMP Unknown)   SpO2 95%   BMI 33.10 kg/m  BP Readings from Last 3 Encounters:  05/23/24 (!) 142/77  04/04/24 135/75  03/03/24 121/74   Wt Readings from Last 3 Encounters:  05/23/24 183 lb 4.8 oz (83.1 kg)  04/04/24 182 lb 11.2 oz (82.9 kg)  03/03/24 184 lb 3.2 oz (83.6 kg)   SpO2 Readings from Last 3 Encounters:  05/23/24 95%  04/04/24 96%  03/03/24 96%      Physical Exam Constitutional:      General: She is not in acute distress.    Appearance: Normal appearance. She is not ill-appearing.  HENT:     Head: Normocephalic and atraumatic.     Nose: Nose normal.  Eyes:     General: No scleral icterus.    Conjunctiva/sclera: Conjunctivae normal.  Cardiovascular:     Rate and Rhythm: Normal rate and regular rhythm.     Heart sounds: Normal heart sounds. No murmur heard.    No friction rub.  Pulmonary:     Effort: Pulmonary effort is normal. No respiratory distress.     Breath sounds: Normal breath sounds. No wheezing, rhonchi or rales.  Abdominal:     Comments: Colostomy site with small polyp (known) otherwise appears clean and well maintained.  Musculoskeletal:        General: Normal range of motion.  Skin:    General: Skin is warm and dry.     Coloration: Skin is not jaundiced or pale.  Neurological:     General: No focal deficit present.     Mental Status: She is alert.  Psychiatric:        Mood and Affect: Mood normal.        Behavior: Behavior normal.      No results found for any  visits on 05/23/24.  Last CBC Lab Results  Component Value Date   WBC 4.5 02/04/2024   HGB 14.7 02/04/2024   HCT 44.6 02/04/2024   MCV 88 02/04/2024   MCH 29.1 02/04/2024   RDW 13.1 02/04/2024   PLT 243 02/04/2024   Last metabolic panel Lab Results  Component Value Date   GLUCOSE 292 (H) 02/04/2024   NA 135 02/04/2024   K 4.4 02/04/2024   CL 95 (L) 02/04/2024   CO2 20 02/04/2024   BUN 13 02/04/2024  CREATININE 0.78 02/04/2024   EGFR 85 02/04/2024   CALCIUM  9.8 02/04/2024   PROT 7.9 02/04/2024   ALBUMIN 4.5 02/04/2024   LABGLOB 3.4 02/04/2024   BILITOT 0.4 02/04/2024   ALKPHOS 64 02/04/2024   AST 17 02/04/2024   ALT 16 02/04/2024   ANIONGAP 12 01/27/2024   Last lipids Lab Results  Component Value Date   CHOL 190 02/04/2024   HDL 39 (L) 02/04/2024   LDLCALC 117 (H) 02/04/2024   TRIG 192 (H) 02/04/2024   CHOLHDL 4.9 (H) 02/04/2024   Last hemoglobin A1c Lab Results  Component Value Date   HGBA1C 13.6 (A) 02/04/2024   HGBA1C 13.6 02/04/2024      The 10-year ASCVD risk score (Arnett DK, et al., 2019) is: 17.3%    Assessment & Plan:   Assessment and Plan    Type 2 diabetes mellitus with diabetic polyneuropathy Chronic, not at goal. Blood glucose levels have been elevated, with a recent reading of 200 mg/dL. Current medications include Trulicity  3 mg, Jardiance  (not yet obtained), glargine 10 units daily, and metformin  4 tablets daily. Reports intermittent diarrhea and nausea with Trulicity , but tolerates it better than Ozempic. A1c will be rechecked today to assess overall control. 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 Cymbalta  (duloxetine ) prescribed for pain management. Initially hesitant due to  concerns about depression, but agreed to try it for pain management. - stopped Cymbalta  for neuropathy, needs to restart. May increase to 60 mg next visit.  Colostomy status Colostomy site appears intact with some irritation around a polyp. Reports soreness where the tape is applied. Home care provides colostomy supplies. - Continue current colostomy care regimen - Monitor for irritation and consult surgeon if needed  Nausea Experiencing significant nausea, particularly in the mornings, with recent episodes of vomiting. Currently taking Protonix  twice daily for reflux, which may contribute to nausea. - Continue Protonix - was only taking once daily. Encouraged to take bid as prescribed. - Prescribed Zofran  for nausea  Essential hypertension Chronic, stable. Initial BP high but looks good on repeat at 127/76. No home monitoring currently in place. Stress may have contributed to elevated reading. - Provided blood pressure log for home monitoring - Continue losartan   Acquired hypothyroidism Still hypo on last eval. Thyroid function needs re-evaluation. - Ordered thyroid function tests  Vitamin D  deficiency Low in the past. Vitamin D  levels need re-evaluation. - Ordered vitamin D  level test  General health maintenance Shingles vaccination series incomplete. Received first dose in 2024. Discussed potential side effects of the second dose, including soreness and redness at the injection site. - Encouraged completion of shingles vaccination series      Orders Placed This Encounter  Procedures   CBC with Differential/Platelet   Comprehensive metabolic panel with GFR   Lipid panel   TSH + free T4   VITAMIN D  25 Hydroxy (Vit-D Deficiency, Fractures)    Return in about 3 months (around 08/21/2024) for DM.    Tanya Mcclure T Jocee Kissick, PA-C     [1]  Social History Tobacco Use   Smoking status: Never    Passive exposure: Never   Smokeless tobacco: Never  Vaping Use   Vaping status:  Never Used  Substance Use Topics   Alcohol use: Never   Drug  use: Never  [2]  Allergies Allergen Reactions   Penicillins Rash   "

## 2024-05-24 ENCOUNTER — Ambulatory Visit (HOSPITAL_BASED_OUTPATIENT_CLINIC_OR_DEPARTMENT_OTHER): Payer: Self-pay | Admitting: Student

## 2024-05-24 DIAGNOSIS — E559 Vitamin D deficiency, unspecified: Secondary | ICD-10-CM

## 2024-05-24 LAB — TSH+FREE T4
Free T4: 1.41 ng/dL (ref 0.82–1.77)
TSH: 0.625 u[IU]/mL (ref 0.450–4.500)

## 2024-05-24 LAB — CBC WITH DIFFERENTIAL/PLATELET
Basophils Absolute: 0.1 x10E3/uL (ref 0.0–0.2)
Basos: 1 %
EOS (ABSOLUTE): 0.2 x10E3/uL (ref 0.0–0.4)
Eos: 4 %
Hematocrit: 48 % — ABNORMAL HIGH (ref 34.0–46.6)
Hemoglobin: 15.9 g/dL (ref 11.1–15.9)
Immature Grans (Abs): 0 x10E3/uL (ref 0.0–0.1)
Immature Granulocytes: 0 %
Lymphocytes Absolute: 1.1 x10E3/uL (ref 0.7–3.1)
Lymphs: 22 %
MCH: 29.6 pg (ref 26.6–33.0)
MCHC: 33.1 g/dL (ref 31.5–35.7)
MCV: 89 fL (ref 79–97)
Monocytes Absolute: 0.4 x10E3/uL (ref 0.1–0.9)
Monocytes: 8 %
Neutrophils Absolute: 3.3 x10E3/uL (ref 1.4–7.0)
Neutrophils: 64 %
Platelets: 281 x10E3/uL (ref 150–450)
RBC: 5.37 x10E6/uL — ABNORMAL HIGH (ref 3.77–5.28)
RDW: 12.9 % (ref 11.7–15.4)
WBC: 5.2 x10E3/uL (ref 3.4–10.8)

## 2024-05-24 LAB — LIPID PANEL
Chol/HDL Ratio: 4.4 ratio (ref 0.0–4.4)
Cholesterol, Total: 174 mg/dL (ref 100–199)
HDL: 40 mg/dL
LDL Chol Calc (NIH): 95 mg/dL (ref 0–99)
Triglycerides: 232 mg/dL — ABNORMAL HIGH (ref 0–149)
VLDL Cholesterol Cal: 39 mg/dL (ref 5–40)

## 2024-05-24 LAB — COMPREHENSIVE METABOLIC PANEL WITH GFR
ALT: 15 IU/L (ref 0–32)
AST: 16 IU/L (ref 0–40)
Albumin: 4.7 g/dL (ref 3.9–4.9)
Alkaline Phosphatase: 75 IU/L (ref 49–135)
BUN/Creatinine Ratio: 16 (ref 12–28)
BUN: 13 mg/dL (ref 8–27)
Bilirubin Total: 0.6 mg/dL (ref 0.0–1.2)
CO2: 24 mmol/L (ref 20–29)
Calcium: 9.6 mg/dL (ref 8.7–10.3)
Chloride: 98 mmol/L (ref 96–106)
Creatinine, Ser: 0.81 mg/dL (ref 0.57–1.00)
Globulin, Total: 3.1 g/dL (ref 1.5–4.5)
Glucose: 196 mg/dL — ABNORMAL HIGH (ref 70–99)
Potassium: 4.1 mmol/L (ref 3.5–5.2)
Sodium: 138 mmol/L (ref 134–144)
Total Protein: 7.8 g/dL (ref 6.0–8.5)
eGFR: 81 mL/min/1.73

## 2024-05-24 LAB — VITAMIN D 25 HYDROXY (VIT D DEFICIENCY, FRACTURES): Vit D, 25-Hydroxy: 19.2 ng/mL — ABNORMAL LOW (ref 30.0–100.0)

## 2024-05-24 MED ORDER — VITAMIN D (ERGOCALCIFEROL) 1.25 MG (50000 UNIT) PO CAPS: 50000.0000 [IU] | ORAL_CAPSULE | ORAL | 0 refills | Status: AC

## 2024-07-26 ENCOUNTER — Ambulatory Visit: Admitting: Oncology

## 2024-07-26 ENCOUNTER — Other Ambulatory Visit

## 2024-08-22 ENCOUNTER — Ambulatory Visit (HOSPITAL_BASED_OUTPATIENT_CLINIC_OR_DEPARTMENT_OTHER): Admitting: Student
